# Patient Record
Sex: Female | Born: 1975 | Race: White | Hispanic: No | Marital: Married | Smoking: Former smoker
Health system: Southern US, Community
[De-identification: ages and names within clinical notes are randomized; demographics above are authoritative.]

## PROBLEM LIST (undated history)

## (undated) DIAGNOSIS — E041 Nontoxic single thyroid nodule: Secondary | ICD-10-CM

## (undated) DIAGNOSIS — I10 Essential (primary) hypertension: Secondary | ICD-10-CM

## (undated) DIAGNOSIS — E119 Type 2 diabetes mellitus without complications: Secondary | ICD-10-CM

## (undated) DIAGNOSIS — F329 Major depressive disorder, single episode, unspecified: Secondary | ICD-10-CM

## (undated) DIAGNOSIS — F32A Depression, unspecified: Secondary | ICD-10-CM

## (undated) DIAGNOSIS — F902 Attention-deficit hyperactivity disorder, combined type: Secondary | ICD-10-CM

## (undated) DIAGNOSIS — F419 Anxiety disorder, unspecified: Secondary | ICD-10-CM

## (undated) DIAGNOSIS — F411 Generalized anxiety disorder: Secondary | ICD-10-CM

## (undated) DIAGNOSIS — T7840XA Allergy, unspecified, initial encounter: Secondary | ICD-10-CM

## (undated) DIAGNOSIS — I517 Cardiomegaly: Secondary | ICD-10-CM

## (undated) DIAGNOSIS — F4001 Agoraphobia with panic disorder: Secondary | ICD-10-CM

## (undated) DIAGNOSIS — E785 Hyperlipidemia, unspecified: Secondary | ICD-10-CM

## (undated) HISTORY — DX: Attention-deficit hyperactivity disorder, combined type: F90.2

## (undated) HISTORY — DX: Major depressive disorder, single episode, unspecified: F32.9

## (undated) HISTORY — DX: Generalized anxiety disorder: F41.1

## (undated) HISTORY — DX: Anxiety disorder, unspecified: F41.9

## (undated) HISTORY — DX: Type 2 diabetes mellitus without complications: E11.9

## (undated) HISTORY — PX: OTHER SURGICAL HISTORY: SHX169

## (undated) HISTORY — DX: Depression, unspecified: F32.A

## (undated) HISTORY — DX: Nontoxic single thyroid nodule: E04.1

## (undated) HISTORY — DX: Agoraphobia with panic disorder: F40.01

## (undated) HISTORY — DX: Essential (primary) hypertension: I10

## (undated) HISTORY — DX: Cardiomegaly: I51.7

## (undated) HISTORY — DX: Morbid (severe) obesity due to excess calories: E66.01

## (undated) HISTORY — DX: Hyperlipidemia, unspecified: E78.5

## (undated) HISTORY — PX: TUBAL LIGATION: SHX77

## (undated) HISTORY — DX: Allergy, unspecified, initial encounter: T78.40XA

---

## 2008-06-15 HISTORY — PX: ENDOMETRIAL ABLATION W/ NOVASURE: SUR434

## 2008-06-18 ENCOUNTER — Ambulatory Visit: Payer: Self-pay | Admitting: Obstetrics and Gynecology

## 2008-06-25 ENCOUNTER — Ambulatory Visit: Payer: Self-pay | Admitting: Obstetrics and Gynecology

## 2014-06-29 LAB — HM PAP SMEAR

## 2014-08-10 LAB — HM DIABETES EYE EXAM

## 2014-08-18 ENCOUNTER — Emergency Department: Payer: Self-pay | Admitting: Emergency Medicine

## 2014-08-18 LAB — URINALYSIS, COMPLETE
BILIRUBIN, UR: NEGATIVE
GLUCOSE, UR: NEGATIVE mg/dL (ref 0–75)
Ketone: NEGATIVE
LEUKOCYTE ESTERASE: NEGATIVE
Nitrite: NEGATIVE
Ph: 5 (ref 4.5–8.0)
Protein: 100
RBC,UR: 2275 /HPF (ref 0–5)
Specific Gravity: 1.021 (ref 1.003–1.030)
Squamous Epithelial: 4
WBC UR: 4 /HPF (ref 0–5)

## 2014-08-18 LAB — CBC WITH DIFFERENTIAL/PLATELET
BASOS ABS: 0 10*3/uL (ref 0.0–0.1)
Basophil %: 0.5 %
EOS ABS: 0.1 10*3/uL (ref 0.0–0.7)
Eosinophil %: 1.1 %
HCT: 41.5 % (ref 35.0–47.0)
HGB: 13.9 g/dL (ref 12.0–16.0)
LYMPHS PCT: 30.9 %
Lymphocyte #: 2.8 10*3/uL (ref 1.0–3.6)
MCH: 28.5 pg (ref 26.0–34.0)
MCHC: 33.6 g/dL (ref 32.0–36.0)
MCV: 85 fL (ref 80–100)
MONOS PCT: 5.9 %
Monocyte #: 0.5 x10 3/mm (ref 0.2–0.9)
NEUTROS PCT: 61.6 %
Neutrophil #: 5.6 10*3/uL (ref 1.4–6.5)
Platelet: 304 10*3/uL (ref 150–440)
RBC: 4.9 10*6/uL (ref 3.80–5.20)
RDW: 13.4 % (ref 11.5–14.5)
WBC: 9 10*3/uL (ref 3.6–11.0)

## 2014-08-18 LAB — COMPREHENSIVE METABOLIC PANEL
Albumin: 3.8 g/dL (ref 3.4–5.0)
Alkaline Phosphatase: 72 U/L (ref 46–116)
Anion Gap: 7 (ref 7–16)
BUN: 11 mg/dL (ref 7–18)
Bilirubin,Total: 0.7 mg/dL (ref 0.2–1.0)
CO2: 28 mmol/L (ref 21–32)
CREATININE: 0.77 mg/dL (ref 0.60–1.30)
Calcium, Total: 8.9 mg/dL (ref 8.5–10.1)
Chloride: 104 mmol/L (ref 98–107)
Glucose: 135 mg/dL — ABNORMAL HIGH (ref 65–99)
OSMOLALITY: 279 (ref 275–301)
Potassium: 3.7 mmol/L (ref 3.5–5.1)
SGOT(AST): 40 U/L — ABNORMAL HIGH (ref 15–37)
SGPT (ALT): 70 U/L — ABNORMAL HIGH (ref 14–63)
SODIUM: 139 mmol/L (ref 136–145)
TOTAL PROTEIN: 7.5 g/dL (ref 6.4–8.2)

## 2014-08-18 LAB — LIPASE, BLOOD: LIPASE: 69 U/L — AB (ref 73–393)

## 2014-08-18 LAB — TROPONIN I: Troponin-I: <0.02 ng/mL

## 2014-10-16 DIAGNOSIS — M545 Low back pain, unspecified: Secondary | ICD-10-CM | POA: Insufficient documentation

## 2014-10-18 ENCOUNTER — Encounter: Payer: Self-pay | Admitting: Emergency Medicine

## 2014-10-25 DIAGNOSIS — I10 Essential (primary) hypertension: Secondary | ICD-10-CM

## 2014-10-25 DIAGNOSIS — E119 Type 2 diabetes mellitus without complications: Secondary | ICD-10-CM | POA: Insufficient documentation

## 2014-10-25 DIAGNOSIS — E668 Other obesity: Secondary | ICD-10-CM | POA: Insufficient documentation

## 2014-10-25 DIAGNOSIS — F339 Major depressive disorder, recurrent, unspecified: Secondary | ICD-10-CM | POA: Insufficient documentation

## 2014-10-25 DIAGNOSIS — Z09 Encounter for follow-up examination after completed treatment for conditions other than malignant neoplasm: Secondary | ICD-10-CM | POA: Insufficient documentation

## 2014-10-25 DIAGNOSIS — Z131 Encounter for screening for diabetes mellitus: Secondary | ICD-10-CM | POA: Insufficient documentation

## 2014-10-25 DIAGNOSIS — E785 Hyperlipidemia, unspecified: Secondary | ICD-10-CM | POA: Insufficient documentation

## 2014-10-25 DIAGNOSIS — M549 Dorsalgia, unspecified: Secondary | ICD-10-CM | POA: Insufficient documentation

## 2014-10-25 HISTORY — DX: Essential (primary) hypertension: I10

## 2014-12-16 ENCOUNTER — Ambulatory Visit: Payer: BLUE CROSS/BLUE SHIELD | Admitting: Family Medicine

## 2014-12-31 ENCOUNTER — Encounter: Payer: Self-pay | Admitting: Family Medicine

## 2014-12-31 ENCOUNTER — Other Ambulatory Visit: Payer: Self-pay | Admitting: Family Medicine

## 2014-12-31 ENCOUNTER — Ambulatory Visit (INDEPENDENT_AMBULATORY_CARE_PROVIDER_SITE_OTHER): Payer: BLUE CROSS/BLUE SHIELD | Admitting: Family Medicine

## 2014-12-31 VITALS — BP 132/88 | HR 116 | Temp 98.6°F | Resp 20 | Ht 65.0 in | Wt 247.4 lb

## 2014-12-31 DIAGNOSIS — E785 Hyperlipidemia, unspecified: Secondary | ICD-10-CM

## 2014-12-31 DIAGNOSIS — F329 Major depressive disorder, single episode, unspecified: Secondary | ICD-10-CM | POA: Diagnosis not present

## 2014-12-31 DIAGNOSIS — F411 Generalized anxiety disorder: Secondary | ICD-10-CM | POA: Insufficient documentation

## 2014-12-31 DIAGNOSIS — I10 Essential (primary) hypertension: Secondary | ICD-10-CM

## 2014-12-31 DIAGNOSIS — F32A Depression, unspecified: Secondary | ICD-10-CM

## 2014-12-31 DIAGNOSIS — E119 Type 2 diabetes mellitus without complications: Secondary | ICD-10-CM

## 2014-12-31 DIAGNOSIS — F320 Major depressive disorder, single episode, mild: Secondary | ICD-10-CM

## 2014-12-31 HISTORY — DX: Generalized anxiety disorder: F41.1

## 2014-12-31 LAB — POCT GLYCOSYLATED HEMOGLOBIN (HGB A1C): Hemoglobin A1C: 5.9

## 2014-12-31 MED ORDER — LISINOPRIL 10 MG PO TABS: 10.0000 mg | ORAL_TABLET | Freq: Every day | ORAL | Status: DC

## 2014-12-31 MED ORDER — METFORMIN HCL 1000 MG PO TABS: 1000.0000 mg | ORAL_TABLET | Freq: Two times a day (BID) | ORAL | Status: DC

## 2014-12-31 MED ORDER — SERTRALINE HCL 100 MG PO TABS: 100.0000 mg | ORAL_TABLET | Freq: Every day | ORAL | Status: DC

## 2014-12-31 NOTE — Progress Notes (Signed)
Name: Melanie Villarreal   MRN: [id number]    DOB: [birth year]   Date:12/31/2014       Progress Note  Subjective  Chief Complaint  Chief Complaint  Patient presents with  . Diabetes    6 month follow-up     HPI  Patient is here for routine follow up of Hypertension. First diagnosed with hypertension several years ago. Current anti-hypertension medication regimen includes dietary modification, weight management and Lisinopril 10mg  one a day. Patient is following physician recommended management. Is checking blood pressure outside of physician office. Associated symptoms do not include headache, dizziness, nausea, lower extremity swelling, shortness of breath, chest pain, numbness.  Patient is here for routine follow up of Diabetes Type II.  Current diabetes medication regimen includes Metformin 1000mg  twice a day. Patient is taking medications as instructed. Overall the patient feels that their blood glucose is well controlled. Checking blood glucose 0 times a day/week consistently/inconsistently. Fasting glucose range unknown. Meal time glucose range unknown. Lowest glucose result unknown.   Related symptoms? increased fatigue Any hypoglycemic symptoms?  No   Started smoking 2-3 cigarettes again due to stress but has not bought her own pack.      Past Medical History  Diagnosis Date  . Diabetes mellitus without complication   . Hyperlipidemia   . HTN, goal below 140/90   . Depression, controlled   . Morbid obesity     History reviewed. No pertinent past surgical history.  No family history on file.  History   Social History  . Marital Status: Married    Spouse Name: N/A  . Number of Children: N/A  . Years of Education: N/A   Occupational History  . Not on file.   Social History Main Topics  . Smoking status: Never Smoker   . Smokeless tobacco: Never Used  . Alcohol Use: 0.0 oz/week    0 Standard drinks or equivalent per week  . Drug Use: No  . Sexual Activity:    Partners: Male   Other Topics Concern  . Not on file   Social History Narrative     Current outpatient prescriptions:  .  lisinopril (PRINIVIL,ZESTRIL) 10 MG tablet, Take by mouth., Disp: , Rfl:  .  metFORMIN (GLUCOPHAGE) 1000 MG tablet, Take 1,000 mg by mouth 2 (two) times daily with a meal. , Disp: , Rfl:  .  sertraline (ZOLOFT) 100 MG tablet, Take by mouth., Disp: , Rfl:   Allergies  Allergen Reactions  . Penicillins Anaphylaxis     ROS  CONSTITUTIONAL: No significant weight changes, fever, chills, weakness or fatigue.  HEENT:  - Eyes: No visual changes.  - Ears: No auditory changes. No pain.  - Nose: No sneezing, congestion, runny nose. - Throat: No sore throat. No changes in swallowing. SKIN: No rash or itching.  CARDIOVASCULAR: No chest pain, chest pressure or chest discomfort. No palpitations or edema.  RESPIRATORY: No shortness of breath, cough or sputum.  GASTROINTESTINAL: No anorexia, nausea, vomiting. No changes in bowel habits. No abdominal pain or blood.  GENITOURINARY: No dysuria. No frequency. No discharge.  NEUROLOGICAL: No headache, dizziness, syncope, paralysis, ataxia, numbness or tingling in the extremities. No memory changes. No change in bowel or bladder control.  MUSCULOSKELETAL: No joint pain. No muscle pain. HEMATOLOGIC: No anemia, bleeding or bruising.  LYMPHATICS: No enlarged lymph nodes.  PSYCHIATRIC: No change in mood. No change in sleep pattern.  ENDOCRINOLOGIC: No reports of sweating, cold or heat intolerance. No polyuria or polydipsia.  Objective  Filed Vitals:   12/31/14 1605  BP: 132/88  Pulse: 116  Temp: 98.6 F (37 C)  TempSrc: Oral  Resp: 20  Height: 5\' 5"  (1.651 m)  Weight: 247 lb 6.4 oz (112.22 kg)  SpO2: 98%    Physical Exam  Constitutional: Patient is obese and well-nourished. In no distress.  HEENT:  - Head: Normocephalic and atraumatic.  - Ears: Bilateral TMs gray, no erythema or effusion - Nose: Nasal mucosa  moist - Mouth/Throat: Oropharynx is clear and moist. No tonsillar hypertrophy or erythema. No post nasal drainage.  - Eyes: Conjunctivae clear, EOM movements normal. PERRLA. No scleral icterus.  Neck: Normal range of motion. Neck supple. No JVD present. No thyromegaly present.  Cardiovascular: Normal rate, regular rhythm and normal heart sounds.  No murmur heard.  Pulmonary/Chest: Effort normal and breath sounds normal. No respiratory distress. Musculoskeletal: Normal range of motion bilateral UE and LE, no joint effusions. Peripheral vascular: Bilateral LE no edema. Neurological: CN II-XII grossly intact with no focal deficits. Alert and oriented to person, place, and time. Coordination, balance, strength, speech and gait are normal.  Skin: Skin is warm and dry. No rash noted. No erythema.  Psychiatric: Patient has a normal mood and affect. Behavior is normal in office today. Judgment and thought content normal in office today.   Assessment & Plan 1. Diabetes mellitus type 2, controlled, without complications [id number] at goal <6% continue current regimen.  - metFORMIN (GLUCOPHAGE) 1000 MG tablet; Take 1 tablet (1,000 mg total) by mouth 2 (two) times daily with a meal.  Dispense: 180 tablet; Refill: 1  2. Hyperlipidemia LDL goal <100 Due for blood work, patient wants to do it August-September.  3. Mild depression Refilled  - sertraline (ZOLOFT) 100 MG tablet; Take 1 tablet (100 mg total) by mouth daily.  Dispense: 90 tablet; Refill: 1  4. Hypertension goal BP (blood pressure) < 140/90 Improved since last visit. Continue lifestyle changes and Lisinopril 10mg  one a day.  - lisinopril (PRINIVIL,ZESTRIL) 10 MG tablet; Take 1 tablet (10 mg total) by mouth daily.  Dispense: 90 tablet; Refill: 1

## 2015-02-07 ENCOUNTER — Other Ambulatory Visit: Payer: Self-pay | Admitting: Family Medicine

## 2015-02-07 ENCOUNTER — Telehealth: Payer: Self-pay

## 2015-02-07 DIAGNOSIS — F419 Anxiety disorder, unspecified: Secondary | ICD-10-CM

## 2015-02-07 MED ORDER — LORAZEPAM 0.5 MG PO TABS: ORAL_TABLET | ORAL | Status: DC

## 2015-02-07 NOTE — Telephone Encounter (Signed)
Patient is requesting a low dose nonaddictive script, such as Ativan, for her anxiety. Patient uses [facility] on [address] [place].

## 2015-02-07 NOTE — Telephone Encounter (Signed)
Please let Melanie Villarreal know that I usually don't start a new medication without an office visit but since she just had a recent visit and I am aware of her mood symptoms I have printed out Ativan prescription for her to pick up. If the symptoms are ongoing, not well controled with Ativan, needs dose or quantity change she must follow up in clinic with me to document need. Thank you.

## 2015-02-07 NOTE — Telephone Encounter (Signed)
Patient informed. 

## 2015-03-14 ENCOUNTER — Other Ambulatory Visit: Payer: Self-pay | Admitting: Family Medicine

## 2015-03-14 ENCOUNTER — Telehealth: Payer: Self-pay

## 2015-03-14 DIAGNOSIS — R05 Cough: Secondary | ICD-10-CM

## 2015-03-14 DIAGNOSIS — T464X5A Adverse effect of angiotensin-converting-enzyme inhibitors, initial encounter: Principal | ICD-10-CM

## 2015-03-14 DIAGNOSIS — R058 Other specified cough: Secondary | ICD-10-CM

## 2015-03-14 DIAGNOSIS — I10 Essential (primary) hypertension: Secondary | ICD-10-CM

## 2015-03-14 MED ORDER — LOSARTAN POTASSIUM 50 MG PO TABS: 50.0000 mg | ORAL_TABLET | Freq: Every day | ORAL | Status: DC

## 2015-03-14 NOTE — Telephone Encounter (Signed)
Stopped Lisinopril 10 mg and sent in Rx for Losartan 50 mg one a day.

## 2015-03-14 NOTE — Telephone Encounter (Signed)
Patient called stating that she has been doing a lot of coughing since starting the Lisinopril and after talking with her pharmacist, she was told that she should stay away from meds that end with "opril". She has stopped taking it but wanted to know if she could get something else called in. Also she said to tell Dr. Nadine Counts, that the Ativan is "the best thing since sliced bread" and that she would like to continue taking it as needed. I told her that i would relay this information to Dr. Nadine Counts for her to decided and then we will go from there. She said ok and thanks.

## 2015-03-15 NOTE — Telephone Encounter (Signed)
Patient was informed via voice mail.

## 2015-04-20 ENCOUNTER — Other Ambulatory Visit: Payer: Self-pay | Admitting: Family Medicine

## 2015-06-22 ENCOUNTER — Other Ambulatory Visit: Payer: Self-pay | Admitting: Family Medicine

## 2015-06-23 ENCOUNTER — Other Ambulatory Visit: Payer: Self-pay

## 2015-06-23 NOTE — Telephone Encounter (Signed)
Patient wants to increase her dosage of Ativan from .5mg  to 1mg  for 30 days due to increase panic attacks. Patient stated if she has to make an appt she will.  Refill request was sent to Dr. Bobetta Lime for approval and submission.

## 2015-06-27 MED ORDER — LORAZEPAM 1 MG PO TABS: 1.0000 mg | ORAL_TABLET | Freq: Two times a day (BID) | ORAL | Status: DC | PRN

## 2015-07-13 ENCOUNTER — Other Ambulatory Visit: Payer: Self-pay | Admitting: Family Medicine

## 2015-08-08 ENCOUNTER — Other Ambulatory Visit: Payer: Self-pay | Admitting: Family Medicine

## 2015-08-30 ENCOUNTER — Ambulatory Visit (INDEPENDENT_AMBULATORY_CARE_PROVIDER_SITE_OTHER): Payer: BLUE CROSS/BLUE SHIELD | Admitting: Family Medicine

## 2015-08-30 ENCOUNTER — Encounter: Payer: Self-pay | Admitting: Family Medicine

## 2015-08-30 VITALS — BP 132/82 | HR 128 | Temp 98.9°F | Resp 16 | Ht 65.0 in | Wt 253.0 lb

## 2015-08-30 DIAGNOSIS — F3342 Major depressive disorder, recurrent, in full remission: Secondary | ICD-10-CM

## 2015-08-30 DIAGNOSIS — R200 Anesthesia of skin: Secondary | ICD-10-CM | POA: Insufficient documentation

## 2015-08-30 DIAGNOSIS — I10 Essential (primary) hypertension: Secondary | ICD-10-CM

## 2015-08-30 DIAGNOSIS — E119 Type 2 diabetes mellitus without complications: Secondary | ICD-10-CM

## 2015-08-30 DIAGNOSIS — R208 Other disturbances of skin sensation: Secondary | ICD-10-CM

## 2015-08-30 DIAGNOSIS — F902 Attention-deficit hyperactivity disorder, combined type: Secondary | ICD-10-CM | POA: Diagnosis not present

## 2015-08-30 LAB — POCT UA - MICROALBUMIN: Microalbumin Ur, POC: NEGATIVE mg/L

## 2015-08-30 LAB — POCT GLYCOSYLATED HEMOGLOBIN (HGB A1C): Hemoglobin A1C: 6.6

## 2015-08-30 MED ORDER — LOSARTAN POTASSIUM 100 MG PO TABS: 100.0000 mg | ORAL_TABLET | Freq: Every day | ORAL | Status: DC

## 2015-08-30 MED ORDER — LISDEXAMFETAMINE DIMESYLATE 20 MG PO CAPS: 20.0000 mg | ORAL_CAPSULE | Freq: Every day | ORAL | Status: DC

## 2015-08-30 NOTE — Progress Notes (Addendum)
Name: Melanie Villarreal   MRN: [id number]    DOB: 04-09-1976   Date:08/30/2015       Progress Note  Subjective  Chief Complaint  Chief Complaint  Patient presents with  . Medication Refill  . Diabetes    checks 3x per week low-70, high-180  . Hypertension    wants to increase  . Depression    working well  . ADHD    HPI  Melanie Villarreal is a 40 year old female here for follow up of chronic medical conditions. She has not followed up in clinic since 12/31/14.   Patient is here for routine follow up of Hypertension. First diagnosed with hypertension several years ago. Current anti-hypertension medication regimen includes dietary modification, weight management and Losartan 50mg  one a day. Patient is following physician recommended management. Is checking blood pressure outside of physician office with results as high as 156/90. Associated symptoms do not include headache, dizziness, nausea, lower extremity swelling, shortness of breath, chest pain, numbness.  Patient is here for routine follow up of Diabetes Type II.  Current diabetes medication regimen includes Metformin 1000 mg twice a day. Patient is taking medications as instructed, when she remembers. Overall the patient feels that their blood glucose is well controlled. Checking blood glucose 2-3 times a week inconsistently. Fasting glucose range 70-110. Meal time glucose range 140-180.   Depression: Patient complains of depression. She complains of depressed mood, difficulty concentrating, fatigue, impaired memory and psychomotor agitation. Onset was approximately several years ago, stable since that time.  She denies current suicidal and homicidal plan or intent.   Family history significant for anxiety and depression.Possible organic causes contributing are: endocrine/metabolic.  Risk factors: previous episode of depression and ADHD diagnosed and treated in the past. Previous treatment includes Ativan and Zoloft (Wellbutrin, Lexapro in  the past) and individual therapy. She complains of the following side effects from the treatment: none.  ADHD: She has graduated from her higher learning program Summer 2016 and started work shortly thereafter which has highlighted her ADHD symptoms. She has a break down recently and has come to terms with knowing that she may need to start back on medication. The condition is characterized as fidgeting, loses items necessary for activity, interrupting others, poor attention span, shifting from one uncompleted task to another and easily distracted but not excessive talking, difficulty remaining seated, difficulty waiting for his turn, engages in physically dangerous activities, difficulty waiting for his turn, blurting out answers before question is complete, difficulty following instructions or antisocial behavior. There has been no associated hearing difficulties, vision disturbances. In the past has used Adderall and she did not like how it made her feel unlike herself.    Past Medical History  Diagnosis Date  . Diabetes mellitus without complication ([place])   . Hyperlipidemia   . HTN, goal below 140/90   . Depression, controlled   . Morbid obesity ([place])   . Anxiety   . Allergy     Patient Active Problem List   Diagnosis Date Noted  . Cough due to ACE inhibitor 03/14/2015  . Anxiety 12/31/2014  . Body mass index of 60 or higher ([place]) 10/25/2014  . Back ache 10/25/2014  . Mild depression 10/25/2014  . Diabetes mellitus type 2, controlled, without complications ([place]) [id number]  . Screening for diabetes mellitus 10/25/2014  . HLD (hyperlipidemia) 10/25/2014  . Hypertension goal BP (blood pressure) < 140/90 10/25/2014  . Extreme obesity ([place]) 10/25/2014  . Follow-up examination 10/25/2014  . Term  birth of infant 10/25/2014  . Low back pain 10/16/2014    Social History  Substance Use Topics  . Smoking status: Current Some Day Smoker    Types: Cigarettes  . Smokeless tobacco: Never  Used  . Alcohol Use: 0.0 oz/week    0 Standard drinks or equivalent per week     Current outpatient prescriptions:  .  LORazepam (ATIVAN) 1 MG tablet, take 1 tablet by mouth twice a day if needed for anxiety, Disp: 30 tablet, Rfl: 2 .  losartan (COZAAR) 50 MG tablet, take 1 tablet by mouth once daily, Disp: 90 tablet, Rfl: 2 .  metFORMIN (GLUCOPHAGE) 1000 MG tablet, Take 1 tablet (1,000 mg total) by mouth 2 (two) times daily with a meal., Disp: 180 tablet, Rfl: 1 .  sertraline (ZOLOFT) 100 MG tablet, take 1 tablet by mouth once daily, Disp: 90 tablet, Rfl: 1  Past Surgical History  Procedure Laterality Date  . Cesarean section    . Tubal ligation      Family History  Problem Relation Age of Onset  . Heart disease Mother   . Hyperlipidemia Mother   . Hypertension Mother   . Miscarriages / Stillbirths Mother     2  . Anxiety disorder Mother   . Alcohol abuse Father   . Cancer Father     testicular  . Depression Father   . Heart disease Sister   . Hyperlipidemia Sister   . Hypertension Sister   . Drug abuse Brother   . Alcohol abuse Brother   . Depression Brother   . ADD / ADHD Son   . Mental illness Maternal Aunt   . Alcohol abuse Maternal Grandfather   . Depression Maternal Grandfather   . Anxiety disorder Maternal Grandfather   . Cancer Paternal Grandmother   . Alcohol abuse Paternal Grandfather     Allergies  Allergen Reactions  . Penicillins Anaphylaxis     Review of Systems  CONSTITUTIONAL: Yes significant weight changes. No fever, chills, weakness or fatigue.  CARDIOVASCULAR: No chest pain, chest pressure or chest discomfort. No palpitations or edema.  RESPIRATORY: No shortness of breath, cough or sputum.  GASTROINTESTINAL: No anorexia, nausea, vomiting. No changes in bowel habits. No abdominal pain or blood.  GENITOURINARY: No dysuria. No frequency. No discharge.  NEUROLOGICAL: No headache, dizziness, syncope, paralysis, ataxia, numbness or tingling in  the extremities. No memory changes. No change in bowel or bladder control.  MUSCULOSKELETAL: No joint pain. No muscle pain. HEMATOLOGIC: No anemia, bleeding or bruising.  LYMPHATICS: No enlarged lymph nodes.  PSYCHIATRIC: No change in mood. No change in sleep pattern.  ENDOCRINOLOGIC: No reports of sweating, cold or heat intolerance. No polyuria or polydipsia.    Objective  BP 132/82 mmHg  Pulse 128  Temp(Src) 98.9 F (37.2 C) (Oral)  Resp 16  Ht 5\' 5"  (1.651 m)  Wt 253 lb (114.76 kg)  BMI 42.10 kg/m2  SpO2 97%  LMP 08/29/2015 (Approximate) Body mass index is 42.1 kg/(m^2).  Physical Exam  Constitutional: Patient is obese and well-nourished. In no distress.  Cardiovascular: Normal rate, regular rhythm and normal heart sounds.  No murmur heard.  Pulmonary/Chest: Effort normal and breath sounds normal. No respiratory distress. Musculoskeletal: Normal range of motion bilateral UE and LE, no joint effusions. Peripheral vascular: Bilateral LE no edema. Neurological: CN II-XII grossly intact with no focal deficits. Alert and oriented to person, place, and time. Coordination, balance, strength, speech and gait are normal.  Skin: Skin is warm and  dry. No rash noted. No erythema.  Psychiatric: Patient has her usual talkative happy (ocassionally emotional) mood and affect. Behavior is normal in office today. Judgment and thought content normal in office today.  Diabetic Foot Exam - Simple   Simple Foot Form  Diabetic Foot exam was performed with the following findings:  Yes 08/30/2015  2:16 PM  Visual Inspection  No deformities, no ulcerations, no other skin breakdown bilaterally:  Yes  Sensation Testing  Intact to touch and monofilament testing bilaterally:  Yes  Pulse Check  Posterior Tibialis and Dorsalis pulse intact bilaterally:  Yes  Comments    Does have numbness over the dorsal surface of right foot 1st digit from previous cut/injury likely causing a nerve  injury.   Results for orders placed or performed in visit on 08/30/15 (from the past 24 hour(s))  POCT HgB A1C     Status: Normal   Collection Time: 08/30/15  1:57 PM  Result Value Ref Range   Hemoglobin A1C 6.6     Assessment & Plan  1. Diabetes mellitus without complication ([place]) Well controled despite poor diet (from stress after starting to work), skipping her medications, back to drinking sodas.   Patient's Hba1c goal is <6.5% for stringent control.  Patient's Hba1c goal is <7% is acceptable  Encouraged patient to continue efforts on checking blood glucose on a daily basis. Fasting blood glucose control goal is 80-130mg /dL and post prandial blood glucose control is 180mg /dL.  Reviewed diet, exercise, lifestyle changes and current medication regimen pertaining to diabetes with the patient.   Reminded patient of the required annual dilated retinal exam.    - POCT HgB A1C - POCT UA - Microalbumin  2. Major depressive disorder, recurrent, in full remission with anxious distress ([place]) Fairly stable on current regimen.  3. Hypertension goal BP (blood pressure) < 140/90 Increase Losartan from 50mg  to 100 mg due to BPs being high outside of office and her starting Vyvanse today.  - losartan (COZAAR) 100 MG tablet; Take 1 tablet (100 mg total) by mouth daily.  Dispense: 90 tablet; Refill: 2  4. ADHD (attention deficit hyperactivity disorder), combined type We discussed treatment options and decided on Vyvanse as a good choice for Melanie Villarreal. The patient has been counseled on the proper use, side effects and potential interactions of the new medication. Patient encouraged to review the side effects and safety profile pamphlet provided with the prescription from the pharmacy as well as request counseling from the pharmacy team as needed.   - lisdexamfetamine (VYVANSE) 20 MG capsule; Take 1 capsule (20 mg total) by mouth daily.  Dispense: 30 capsule; Refill: 0  5. Numbness of  toes From injury, not peripheral neuropathy.

## 2015-09-28 ENCOUNTER — Ambulatory Visit (INDEPENDENT_AMBULATORY_CARE_PROVIDER_SITE_OTHER): Payer: BLUE CROSS/BLUE SHIELD | Admitting: Family Medicine

## 2015-09-28 ENCOUNTER — Encounter: Payer: Self-pay | Admitting: Family Medicine

## 2015-09-28 VITALS — BP 144/86 | HR 114 | Temp 98.3°F | Resp 16 | Ht 65.0 in | Wt 250.0 lb

## 2015-09-28 DIAGNOSIS — F902 Attention-deficit hyperactivity disorder, combined type: Secondary | ICD-10-CM | POA: Diagnosis not present

## 2015-09-28 DIAGNOSIS — R21 Rash and other nonspecific skin eruption: Secondary | ICD-10-CM

## 2015-09-28 DIAGNOSIS — F3342 Major depressive disorder, recurrent, in full remission: Secondary | ICD-10-CM | POA: Diagnosis not present

## 2015-09-28 DIAGNOSIS — J9801 Acute bronchospasm: Secondary | ICD-10-CM

## 2015-09-28 DIAGNOSIS — B349 Viral infection, unspecified: Secondary | ICD-10-CM

## 2015-09-28 DIAGNOSIS — I10 Essential (primary) hypertension: Secondary | ICD-10-CM | POA: Diagnosis not present

## 2015-09-28 MED ORDER — AZITHROMYCIN 250 MG PO TABS: ORAL_TABLET | ORAL | Status: DC

## 2015-09-28 MED ORDER — LISDEXAMFETAMINE DIMESYLATE 40 MG PO CAPS: 40.0000 mg | ORAL_CAPSULE | Freq: Every day | ORAL | Status: DC

## 2015-09-28 MED ORDER — LISDEXAMFETAMINE DIMESYLATE 40 MG PO CAPS: 40.0000 mg | ORAL_CAPSULE | ORAL | Status: DC

## 2015-09-28 MED ORDER — SERTRALINE HCL 100 MG PO TABS: 100.0000 mg | ORAL_TABLET | Freq: Every day | ORAL | Status: DC

## 2015-09-28 NOTE — Addendum Note (Signed)
Addended by: Bobetta Lime on: 09/28/2015 01:40 PM   Modules accepted: Miquel Dunn

## 2015-09-28 NOTE — Progress Notes (Signed)
Name: Melanie Villarreal   MRN: [id number]    DOB: 1976/07/07   Date:09/28/2015       Progress Note  Subjective  Chief Complaint  Chief Complaint  Patient presents with  . Medication Refill  . ADHD    wants to increase vyvanse to 40mg   . Rash    1 week.  reddness, burns  . URI    HPI   Melanie Villarreal is a 40 year old female here for follow up of chronic medical conditions. She also complains of a red bumpy rash and URI symptoms. Rash only on face, started after using husband's face wash.   Patient is here for routine follow up of Hypertension. First diagnosed with hypertension several years ago. Current anti-hypertension medication regimen includes dietary modification, weight management and Losartan 100mg  one a day (increased at last visit). Patient is following physician recommended management. Is checking blood pressure outside of physician office. Associated symptoms do not include headache, dizziness, nausea, lower extremity swelling, shortness of breath, chest pain, numbness.  Depression: Patient complains of depression. She complains of depressed mood, difficulty concentrating, fatigue, impaired memory and psychomotor agitation. Onset was approximately several years ago, stable since that time. She denies current suicidal and homicidal plan or intent. Family history significant for anxiety and depression.Possible organic causes contributing are: endocrine/metabolic. Risk factors: previous episode of depression and ADHD diagnosed and treated in the past. Previous treatment includes Ativan and Zoloft (Wellbutrin, Lexapro in the past) and individual therapy. She complains of the following side effects from the treatment: none.  ADHD: She has graduated from her higher learning program Summer 2016 and started work shortly thereafter which has highlighted her ADHD symptoms. She has a break down recently and has come to terms with knowing that she may need to start back on medication. The  condition is characterized as fidgeting, loses items necessary for activity, interrupting others, poor attention span, shifting from one uncompleted task to another and easily distracted but not excessive talking, difficulty remaining seated, difficulty waiting for his turn, engages in physically dangerous activities, difficulty waiting for his turn, blurting out answers before question is complete, difficulty following instructions or antisocial behavior. There has been no associated hearing difficulties, vision disturbances. In the past has used Adderall and she did not like how it made her feel unlike herself. At our last visit 1 month ago I started her on Vyvanse 20mg  a day. She reported improved symptoms and would like to increase dose.   Patient is also here today with concerns regarding the following symptoms sinus pressure, productive cough, achiness and low grade fevers that started weeks ago.  Associated with fevers and malaise. Has tried the following home remedies: dayquil    Past Medical History  Diagnosis Date  . Diabetes mellitus without complication ([place])   . Hyperlipidemia   . HTN, goal below 140/90   . Depression, controlled   . Morbid obesity ([place])   . Anxiety   . Allergy   . ADHD (attention deficit hyperactivity disorder), combined type     Social History  Substance Use Topics  . Smoking status: Current Some Day Smoker    Types: Cigarettes  . Smokeless tobacco: Never Used  . Alcohol Use: 0.0 oz/week    0 Standard drinks or equivalent per week     Current outpatient prescriptions:  .  lisdexamfetamine (VYVANSE) 20 MG capsule, Take 1 capsule (20 mg total) by mouth daily., Disp: 30 capsule, Rfl: 0 .  LORazepam (ATIVAN) 1 MG  tablet, take 1 tablet by mouth twice a day if needed for anxiety, Disp: 30 tablet, Rfl: 2 .  losartan (COZAAR) 100 MG tablet, Take 1 tablet (100 mg total) by mouth daily., Disp: 90 tablet, Rfl: 2 .  metFORMIN (GLUCOPHAGE) 1000 MG tablet, Take 1  tablet (1,000 mg total) by mouth 2 (two) times daily with a meal., Disp: 180 tablet, Rfl: 1 .  sertraline (ZOLOFT) 100 MG tablet, take 1 tablet by mouth once daily, Disp: 90 tablet, Rfl: 1  Allergies  Allergen Reactions  . Penicillins Anaphylaxis    ROS  CONSTITUTIONAL: No significant weight changes, fever, chills, weakness or fatigue.  HEENT:  - Eyes: No visual changes.  - Ears: No auditory changes. No pain.  - Nose: Yes nasal congestion and runny nose. - Throat: No sore throat. No changes in swallowing. SKIN: Yes rash or itching.  CARDIOVASCULAR: No chest pain, chest pressure or chest discomfort. No palpitations or edema.  RESPIRATORY: No shortness of breath. Yes cough. GASTROINTESTINAL: No anorexia, nausea, vomiting. No changes in bowel habits. No abdominal pain or blood.  GENITOURINARY: No dysuria. No frequency. No discharge.  NEUROLOGICAL: No headache, dizziness, syncope, paralysis, ataxia, numbness or tingling in the extremities. No memory changes. No change in bowel or bladder control.  MUSCULOSKELETAL: No joint pain. No muscle pain. HEMATOLOGIC: No anemia, bleeding or bruising.  LYMPHATICS: No enlarged lymph nodes.  PSYCHIATRIC: Improved change in mood. No change in sleep pattern.  ENDOCRINOLOGIC: No reports of sweating, cold or heat intolerance. No polyuria or polydipsia.      Objective  Vitals Blood pressure 144/86, pulse 114, temperature 98.3 F (36.8 C), temperature source Oral, resp. rate 16, height 5\' 5"  (1.651 m), weight 250 lb (113.399 kg), last menstrual period 08/29/2015, SpO2 97 %.  Body mass index is 41.6 kg/(m^2).   Physical Exam  Constitutional: Patient remains obese and well-nourished. In no acute distress. HEENT:  - Head: Normocephalic and atraumatic. Face with red maculopapular rash, mostly on cheeks and chin. No open skin, scales, pustules. - Ears: RIGHT TM bulging with minimal clear exudate, LEFT TM bulging with minimal clear exudate.  - Nose:  Nasal mucosa boggy and congested.  - Mouth/Throat: Oropharynx is moist with slight erythema of bilateral tonsils without hypertrophy or exudates. Post nasal drainage present.  - Eyes: Conjunctivae clear, EOM movements normal. PERRLA. No scleral icterus.  Neck: Normal range of motion. Neck supple. No JVD present. No thyromegaly present. No local lymphadenopathy. Cardiovascular: Regular rate, regular rhythm with no murmurs heard.  Pulmonary/Chest: Effort normal and breath sounds clear in all lung fields. Some coarse breath sounds audible upper anterior fields. Musculoskeletal: Normal range of motion bilateral UE and LE, no joint effusions. Skin: Skin is warm and dry otherwise Psychiatric: Patient has a stable mood and affect. Behavior is normal in office today. Judgment and thought content normal in office today.   Assessment & Plan   1. Major depressive disorder, recurrent, in full remission with anxious distress ([place]) Well controled.  - sertraline (ZOLOFT) 100 MG tablet; Take 1 tablet (100 mg total) by mouth daily.  Dispense: 90 tablet; Refill: 1  2. Hypertension goal BP (blood pressure) < 140/90 Slightly elevated today. Says she ate salty food at lunch before coming here. If elevated at next visit I recommended that she start HCTZ as well.  3. ADHD (attention deficit hyperactivity disorder), combined type Improved symptoms on Vyvanse will increase dose to sustain effects.    - lisdexamfetamine (VYVANSE) 40 MG capsule; Take 1  capsule (40 mg total) by mouth daily.  Dispense: 30 capsule; Refill: 0 - lisdexamfetamine (VYVANSE) 40 MG capsule; Take 1 capsule (40 mg total) by mouth every morning.  Dispense: 30 capsule; Refill: 0 - lisdexamfetamine (VYVANSE) 40 MG capsule; Take 1 capsule (40 mg total) by mouth every morning.  Dispense: 30 capsule; Refill: 0  4. Acute bronchospasm due to viral infection Supportive therapy recommended, if symptoms worsen may pick up abx.  - azithromycin  (ZITHROMAX) 250 MG tablet; 2 tabs po day 1, then 1 tab po qday for 4 more days, Zpak  Dispense: 6 tablet; Refill: 0  5. Facial rash Allergic rash, recommended no make up, use sun block if not irritative otherwise keep well moisturized and may mix small amount of hydrocortisone with cream/lotion.

## 2015-10-28 ENCOUNTER — Other Ambulatory Visit: Payer: Self-pay | Admitting: Family Medicine

## 2015-10-28 DIAGNOSIS — E119 Type 2 diabetes mellitus without complications: Secondary | ICD-10-CM

## 2015-10-28 MED ORDER — METFORMIN HCL 1000 MG PO TABS: 1000.0000 mg | ORAL_TABLET | Freq: Two times a day (BID) | ORAL | Status: DC

## 2015-10-28 NOTE — Telephone Encounter (Signed)
I will need to see patient sooner than June; she can see me in June for ADHD, but she has not had liver or kidney function or lipids checked in over a year Please ask her to schedule fasting visit with me in next 2 weeks for just diabetes, cholesterol, and HTN We'll keep June appt for ADHD, anxiety Thanks

## 2015-10-28 NOTE — Telephone Encounter (Signed)
Patient was last seen on 09-28-15 by Dr Nadine Counts. The prescription for Metformin was not filled. Please send a refill to [facility] She does have another appointment scheduled in a couple of months

## 2015-10-31 NOTE — Telephone Encounter (Signed)
Left voice message to inform patient that prescription has been sent to pharmacy and that we are needing to schedule appointment

## 2015-11-14 ENCOUNTER — Other Ambulatory Visit: Payer: Self-pay

## 2015-11-14 NOTE — Telephone Encounter (Signed)
Patient needs an appt to discuss Xanax; this is a controlled substance; thank you

## 2015-11-14 NOTE — Telephone Encounter (Signed)
LVM for pt to call the office back.  °

## 2015-12-15 ENCOUNTER — Other Ambulatory Visit: Payer: Self-pay | Admitting: Family Medicine

## 2015-12-16 NOTE — Telephone Encounter (Signed)
See 10/28/15 note; needs labs and appt; no renal function in over a year Rx denied

## 2015-12-30 ENCOUNTER — Encounter: Payer: Self-pay | Admitting: Family Medicine

## 2015-12-30 ENCOUNTER — Ambulatory Visit (INDEPENDENT_AMBULATORY_CARE_PROVIDER_SITE_OTHER): Payer: BLUE CROSS/BLUE SHIELD | Admitting: Family Medicine

## 2015-12-30 VITALS — BP 140/82 | HR 113 | Temp 98.7°F | Resp 16 | Wt 243.0 lb

## 2015-12-30 DIAGNOSIS — E785 Hyperlipidemia, unspecified: Secondary | ICD-10-CM

## 2015-12-30 DIAGNOSIS — I10 Essential (primary) hypertension: Secondary | ICD-10-CM

## 2015-12-30 DIAGNOSIS — F4001 Agoraphobia with panic disorder: Secondary | ICD-10-CM

## 2015-12-30 DIAGNOSIS — E119 Type 2 diabetes mellitus without complications: Secondary | ICD-10-CM | POA: Diagnosis not present

## 2015-12-30 DIAGNOSIS — Z79899 Other long term (current) drug therapy: Secondary | ICD-10-CM | POA: Diagnosis not present

## 2015-12-30 DIAGNOSIS — F411 Generalized anxiety disorder: Secondary | ICD-10-CM | POA: Diagnosis not present

## 2015-12-30 DIAGNOSIS — F3342 Major depressive disorder, recurrent, in full remission: Secondary | ICD-10-CM

## 2015-12-30 DIAGNOSIS — Z5181 Encounter for therapeutic drug level monitoring: Secondary | ICD-10-CM

## 2015-12-30 DIAGNOSIS — F902 Attention-deficit hyperactivity disorder, combined type: Secondary | ICD-10-CM

## 2015-12-30 HISTORY — DX: Agoraphobia with panic disorder: F40.01

## 2015-12-30 MED ORDER — LISDEXAMFETAMINE DIMESYLATE 40 MG PO CAPS: 40.0000 mg | ORAL_CAPSULE | ORAL | Status: DC

## 2015-12-30 MED ORDER — LORAZEPAM 1 MG PO TABS: ORAL_TABLET | ORAL | Status: DC

## 2015-12-30 MED ORDER — SERTRALINE HCL 100 MG PO TABS: 150.0000 mg | ORAL_TABLET | Freq: Every day | ORAL | Status: DC

## 2015-12-30 NOTE — Patient Instructions (Addendum)
Check out Answers to Distraction by [place] and [place] Try [place] = Only Handle It Once  Increase sertraline to 150 mg daily  Return in 1-2 weeks for diabetes, lipids, etc and come fasting for labs the day or two BEFORE your appointment

## 2015-12-30 NOTE — Progress Notes (Signed)
BP 140/82 mmHg  Pulse 113  Temp(Src) 98.7 F (37.1 C) (Oral)  Resp 16  Wt 243 lb (110.224 kg)  SpO2 98%  LMP 12/26/2015   Subjective:    Patient ID: Melanie Villarreal, female    DOB: [birth year], 40 y.o.   MRN: [id number]  HPI: Melanie Villarreal is a 40 y.o. female  Chief Complaint  Patient presents with  . Medication Refill   Patient is new to me with multiple conditions to cover in a short visit; we decided mutually to cover her mental health issues today and have her return for cholesterol, diabetes, hypertension, etc. Hx depression Stopped taking anxiety medicine years ago Graduated with master's degree, started going back on medicine Does not want clonazepam; took it once and cold [place] quit it and almost had a seizure Does not like to medicine, only if needed Only does 0.5 mg total per day, lorazepam 0.5 mg total taken at night; she rarely doubles and takes another 0.5 mg She loves her antidepressant, sertraline works well; 100 mg wants to stay there; if she misses a few she can tell a difference  She has ADHD; Vyvanse is perfect for her ADHD; would like to take 50 mg but won't go up; notices drop in mood on the day she doesn't take it; she is very self-aware No headaches; does not eat as much as she used to; fewer sweets; no tremors; no palpitations Seeing a benefit on the medicine; she refused to take ADHD med for a long time; working with mental health clients; medicine  Benefit of staying of medicine: can focus on clients and focus on their well-being; staying on tasks; increased/better organizational skills; better listener Hated adderall years ago  Uses lorazepam for anxiety; generalized anxiety d/o; does get panic attacks; one last Tuesday  Patient has been to cardiologist, passed stress test, Dr. Clayborn Bigness  She'll return for fasting labs, lipids, DM, HTN next Stopped the losartan b/c it made her gain weight she says (still in med list)  Depression screen [facility] 2/9  12/30/2015 08/30/2015 12/31/2014  Decreased Interest 0 0 0  Down, Depressed, Hopeless 0 0 0  PHQ - 2 Score 0 0 0   Relevant past medical, surgical, family and social history reviewed Past Medical History  Diagnosis Date  . Diabetes mellitus without complication ([place])   . Hyperlipidemia   . HTN, goal below 140/90   . Depression, controlled   . Morbid obesity ([place])   . Anxiety   . Allergy   . ADHD (attention deficit hyperactivity disorder), combined type   . Panic disorder with agoraphobia and moderate panic attacks 12/30/2015  . Generalized anxiety disorder 12/31/2014   Past Surgical History  Procedure Laterality Date  . Cesarean section    . Tubal ligation     Family History  Problem Relation Age of Onset  . Heart disease Mother   . Hyperlipidemia Mother   . Hypertension Mother   . Miscarriages / Stillbirths Mother     2  . Anxiety disorder Mother   . Alcohol abuse Father   . Cancer Father     testicular  . Depression Father   . Heart disease Sister   . Hyperlipidemia Sister   . Hypertension Sister   . Drug abuse Brother   . Alcohol abuse Brother   . Depression Brother   . ADD / ADHD Son   . Mental illness Maternal Aunt   . Alcohol abuse Maternal Grandfather   . Depression  Maternal Grandfather   . Anxiety disorder Maternal Grandfather   . Cancer Paternal Grandmother   . Alcohol abuse Paternal Grandfather    Social History  Substance Use Topics  . Smoking status: Former Smoker    Types: Cigarettes    Quit date: 11/28/2015  . Smokeless tobacco: Never Used  . Alcohol Use: 0.0 oz/week    0 Standard drinks or equivalent per week   Interim medical history since last visit reviewed. Allergies and medications reviewed  Review of Systems Per HPI unless specifically indicated above     Objective:    BP 140/82 mmHg  Pulse 113  Temp(Src) 98.7 F (37.1 C) (Oral)  Resp 16  Wt 243 lb (110.224 kg)  SpO2 98%  LMP 12/26/2015  Wt Readings from Last 3 Encounters:    12/30/15 243 lb (110.224 kg)  09/28/15 250 lb (113.399 kg)  08/30/15 253 lb (114.76 kg)    Physical Exam  Constitutional: She appears well-developed and well-nourished. No distress.  HENT:  Head: Normocephalic and atraumatic.  Eyes: EOM are normal. No scleral icterus.  Neck: No thyromegaly present.  Cardiovascular: Regular rhythm and normal heart sounds.  Tachycardia present.   No murmur heard. Right above 100 bpm during auscultation  Pulmonary/Chest: Effort normal and breath sounds normal. No respiratory distress. She has no wheezes.  Abdominal: Soft. Bowel sounds are normal. She exhibits no distension.  Musculoskeletal: Normal range of motion. She exhibits no edema.  Neurological: She is alert. She exhibits normal muscle tone.  Skin: Skin is warm and dry. She is not diaphoretic. No pallor.  Psychiatric: She has a normal mood and affect. Her behavior is normal. Judgment and thought content normal. Her mood appears not anxious. She does not exhibit a depressed mood.  Good eye contact with examiner      Assessment & Plan:   Problem List Items Addressed This Visit      Cardiovascular and Mediastinum   Hypertension goal BP (blood pressure) < 140/90    Not ideal; will not increase med; try weight loss and DASH guidelines        Endocrine   Diabetes mellitus type 2, controlled, without complications (HCC) (Chronic)    Check A1c      Relevant Orders   Hemoglobin A1c (Completed)   Microalbumin / creatinine urine ratio     Other   ADHD (attention deficit hyperactivity disorder), combined type - Primary    Continue Vyvanse; will not go up on dose right now since working well; see AVS      Relevant Medications   lisdexamfetamine (VYVANSE) 40 MG capsule   Chronic prescription benzodiazepine use    Explained serious warning of combining opiates with benzos, in case pt ever has surgery or injury or is given Rx for anything containing a narcotic; could cause accidental fatal  overdose; discussed the Aug 2016 FDA press release regarding this; if taking daily, do not ever stop abruptly b/c of risk of seizures; talk to provider about weaning down; never ever mix with alcohol, and do not combine benzo with alcohol within six hours of each other      Controlled substance agreement signed    Discussed risk of controlled substances including possible unintentional overdose, especially if mixed with alcohol or other pills; typical speech given including illegal to share, even out of the goodness of patient's heart, always keep in the original bottle, safeguard medicine, do NOT mix with alcohol, other pain pills, "nerve" or anxiety pills, or sleeping pills; I  am not obligated to approve of early refill or give new prescription if medicine is lost, stolen, or destroyed even with a police report, etc.; patient agrees with plan; controlled substance contract signed; copy of contract given to patient       Generalized anxiety disorder    Increase sertraline to 150 mg daily; continue judicious use of benzo      HLD (hyperlipidemia)    Check lipids      Relevant Orders   Lipid panel (Completed)   Major depressive disorder, recurrent, in full remission with anxious distress (HCC)   Relevant Medications   sertraline (ZOLOFT) 100 MG tablet   LORazepam (ATIVAN) 1 MG tablet   Medication monitoring encounter   Relevant Orders   CBC with Differential/Platelet   COMPLETE METABOLIC PANEL WITH GFR   Panic disorder with agoraphobia and moderate panic attacks      Follow up plan: Return in about 1 week (around 01/06/2016) for diabetes, cholesterol, etc.; labs 1-2 days prior.  An after-visit summary was printed and given to the patient at [facility].  Please see the patient instructions which may contain other information and recommendations beyond what is mentioned above in the assessment and plan.  Meds ordered this encounter  Medications  . sertraline (ZOLOFT) 100 MG tablet     Sig: Take 1.5 tablets (150 mg total) by mouth daily.    Dispense:  135 tablet    Refill:  1  . LORazepam (ATIVAN) 1 MG tablet    Sig: One-half or one whole pill by mouth at bedtime if needed    Dispense:  20 tablet    Refill:  0  . lisdexamfetamine (VYVANSE) 40 MG capsule    Sig: Take 1 capsule (40 mg total) by mouth every morning.    Dispense:  30 capsule    Refill:  0    Refill 11/28/15    Orders Placed This Encounter  Procedures  . Hemoglobin A1c  . Lipid panel  . CBC with Differential/Platelet  . Microalbumin / creatinine urine ratio  . COMPLETE METABOLIC PANEL WITH GFR  . COMPLETE METABOLIC PANEL WITH GFR  . Microalbumin / creatinine urine ratio  . CBC with Differential/Platelet

## 2015-12-30 NOTE — Assessment & Plan Note (Signed)
Increase sertraline to 150 mg daily; continue judicious use of benzo

## 2015-12-30 NOTE — Assessment & Plan Note (Signed)
Continue Vyvanse; will not go up on dose right now since working well; see AVS

## 2015-12-30 NOTE — Assessment & Plan Note (Signed)

## 2016-01-03 DIAGNOSIS — Z5181 Encounter for therapeutic drug level monitoring: Secondary | ICD-10-CM | POA: Insufficient documentation

## 2016-01-03 LAB — COMPLETE METABOLIC PANEL WITH GFR
ALT: 43 U/L — AB (ref 6–29)
AST: 26 U/L (ref 10–30)
Albumin: 4.4 g/dL (ref 3.6–5.1)
Alkaline Phosphatase: 71 U/L (ref 33–115)
BILIRUBIN TOTAL: 0.9 mg/dL (ref 0.2–1.2)
BUN: 10 mg/dL (ref 7–25)
CALCIUM: 9.3 mg/dL (ref 8.6–10.2)
CHLORIDE: 102 mmol/L (ref 98–110)
CO2: 23 mmol/L (ref 20–31)
CREATININE: 0.7 mg/dL (ref 0.50–1.10)
Glucose, Bld: 127 mg/dL — ABNORMAL HIGH (ref 65–99)
Potassium: 4.3 mmol/L (ref 3.5–5.3)
Sodium: 138 mmol/L (ref 135–146)
TOTAL PROTEIN: 7.1 g/dL (ref 6.1–8.1)

## 2016-01-03 LAB — LIPID PANEL
CHOLESTEROL: 226 mg/dL — AB (ref 125–200)
HDL: 29 mg/dL — AB (ref >=46)
LDL CALC: 143 mg/dL — AB (ref <130)
TRIGLYCERIDES: 270 mg/dL — AB (ref <150)
Total CHOL/HDL Ratio: 7.8 Ratio — ABNORMAL HIGH (ref <=5.0)
VLDL: 54 mg/dL — AB (ref <30)

## 2016-01-03 LAB — CBC WITH DIFFERENTIAL/PLATELET
BASOS ABS: 0 {cells}/uL (ref 0–200)
Basophils Relative: 0 %
EOS ABS: 84 {cells}/uL (ref 15–500)
EOS PCT: 1 %
HCT: 41.9 % (ref 35.0–45.0)
Hemoglobin: 14.2 g/dL (ref 11.7–15.5)
LYMPHS PCT: 36 %
Lymphs Abs: 3024 cells/uL (ref 850–3900)
MCH: 28.7 pg (ref 27.0–33.0)
MCHC: 33.9 g/dL (ref 32.0–36.0)
MCV: 84.8 fL (ref 80.0–100.0)
MONOS PCT: 6 %
MPV: 10.8 fL (ref 7.5–12.5)
Monocytes Absolute: 504 cells/uL (ref 200–950)
NEUTROS PCT: 57 %
Neutro Abs: 4788 cells/uL (ref 1500–7800)
PLATELETS: 342 10*3/uL (ref 140–400)
RBC: 4.94 MIL/uL (ref 3.80–5.10)
RDW: 14.1 % (ref 11.0–15.0)
WBC: 8.4 10*3/uL (ref 3.8–10.8)

## 2016-01-03 LAB — HEMOGLOBIN A1C
Hgb A1c MFr Bld: 6.7 % — ABNORMAL HIGH (ref <5.7)
Mean Plasma Glucose: 146 mg/dL

## 2016-01-03 NOTE — Assessment & Plan Note (Signed)
Not ideal; will not increase med; try weight loss and DASH guidelines

## 2016-01-03 NOTE — Assessment & Plan Note (Signed)
Check A1c. 

## 2016-01-03 NOTE — Assessment & Plan Note (Signed)
Check lipids 

## 2016-01-04 LAB — MICROALBUMIN / CREATININE URINE RATIO
CREATININE, URINE: 202 mg/dL (ref 20–320)
MICROALB UR: 1.7 mg/dL
MICROALB/CREAT RATIO: 8 ug/mg{creat} (ref <30)

## 2016-01-05 ENCOUNTER — Encounter: Payer: Self-pay | Admitting: Family Medicine

## 2016-01-08 NOTE — Assessment & Plan Note (Addendum)
Explained serious warning of combining opiates with benzos, in case pt ever has surgery or injury or is given Rx for anything containing a narcotic; could cause accidental fatal overdose; discussed the Aug 2016 FDA press release regarding this; if taking daily, do not ever stop abruptly b/c of risk of seizures; talk to provider about weaning down; never ever mix with alcohol, and do not combine benzo with alcohol within six hours of each other

## 2016-01-10 ENCOUNTER — Encounter: Payer: Self-pay | Admitting: Family Medicine

## 2016-01-10 ENCOUNTER — Ambulatory Visit (INDEPENDENT_AMBULATORY_CARE_PROVIDER_SITE_OTHER): Payer: BLUE CROSS/BLUE SHIELD | Admitting: Family Medicine

## 2016-01-10 VITALS — BP 138/90 | HR 101 | Temp 97.6°F | Resp 14 | Wt 242.0 lb

## 2016-01-10 DIAGNOSIS — F3342 Major depressive disorder, recurrent, in full remission: Secondary | ICD-10-CM | POA: Diagnosis not present

## 2016-01-10 DIAGNOSIS — E785 Hyperlipidemia, unspecified: Secondary | ICD-10-CM

## 2016-01-10 DIAGNOSIS — I10 Essential (primary) hypertension: Secondary | ICD-10-CM | POA: Diagnosis not present

## 2016-01-10 DIAGNOSIS — F902 Attention-deficit hyperactivity disorder, combined type: Secondary | ICD-10-CM | POA: Diagnosis not present

## 2016-01-10 DIAGNOSIS — E119 Type 2 diabetes mellitus without complications: Secondary | ICD-10-CM

## 2016-01-10 MED ORDER — ATORVASTATIN CALCIUM 20 MG PO TABS: 20.0000 mg | ORAL_TABLET | Freq: Every day | ORAL | Status: DC

## 2016-01-10 MED ORDER — SERTRALINE HCL 100 MG PO TABS: 150.0000 mg | ORAL_TABLET | Freq: Every day | ORAL | Status: DC

## 2016-01-10 MED ORDER — LISDEXAMFETAMINE DIMESYLATE 40 MG PO CAPS: 40.0000 mg | ORAL_CAPSULE | ORAL | Status: DC

## 2016-01-10 MED ORDER — VALSARTAN 40 MG PO TABS: 40.0000 mg | ORAL_TABLET | Freq: Every day | ORAL | Status: DC

## 2016-01-10 MED ORDER — METFORMIN HCL 1000 MG PO TABS: 1000.0000 mg | ORAL_TABLET | Freq: Two times a day (BID) | ORAL | Status: DC

## 2016-01-10 NOTE — Patient Instructions (Addendum)
Please do call your cardiologist Try to limit saturated fats in your diet (bologna, hot dogs, barbeque, cheeseburgers, hamburgers, steak, bacon, sausage, cheese, etc.) and get more fresh fruits, vegetables, and whole grains  Check out the information at familydoctor.org entitled "Nutrition for Weight Loss: What You Need to Know about Fad Diets" Try to lose between 1-2 pounds per week by taking in fewer calories and burning off more calories You can succeed by limiting portions, limiting foods dense in calories and fat, becoming more active, and drinking 8 glasses of water a day (64 ounces) Don't skip meals, especially breakfast, as skipping meals may alter your metabolism Do not use over-the-counter weight loss pills or gimmicks that claim rapid weight loss A healthy BMI (or body mass index) is between 18.5 and 24.9 You can calculate your ideal BMI at the [employer] website [employer]fr  Please call Dr. Clayborn Bigness to see about any further testing I recommend that you stop Vyvanse if you are worried that you have a blockage UNTIL he clears you and says you are fine to continue that; you are at risk for a heart attack on the Vyvanse  Start new blood pressure 40 mg daily Start atorvastatin 20 mg at bedtime  Return for CMA visit in 3 weeks for BP recheck and BMP (non-fasting) Return to see me in a little over 6 weeks, have labs checked the day before

## 2016-01-10 NOTE — Progress Notes (Signed)
BP 138/90   Pulse (!) 101   Temp 97.6 F (36.4 C) (Oral)   Resp 14   Wt 242 lb (109.8 kg)   LMP 12/26/2015   SpO2 98%   BMI 40.27 kg/m    Subjective:    Patient ID: Melanie Villarreal, female    DOB: 03/13/1976, 40 y.o.   MRN: [id number]  HPI: Melanie Villarreal is a 40 y.o. female  Chief Complaint  Patient presents with  . Follow-up    1 week  . Diabetes  . 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; -7.5 vision Not excessive thirst or frequent urination  High cholesterol; mother had first stent at age 55; patient went to cardiologist not long ago;  Saw cardiologist, murmur, chest pain; had a stress test; echo was normal; LVEF 55% Cannot have babies, tubes tied and Novasure  Lab Results  Component Value Date   CHOL 226 (H) 01/03/2016   HDL 29 (L) 01/03/2016   LDLCALC 143 (H) 01/03/2016   TRIG 270 (H) 01/03/2016   CHOLHDL 7.8 (H) 01/03/2016   Obesity; down 8 pounds since March, over 3 months;  118 pounds to 130 after 1st baby, 141 to 136 after 2nd baby, 167 after 3rd baby  Improved mood after dose adjustment; improved libido  Depression screen [facility] 2/9 12/30/2015 08/30/2015 12/31/2014  Decreased Interest 0 0 0  Down, Depressed, Hopeless 0 0 0  PHQ - 2 Score 0 0 0   Relevant past medical, surgical, family and social history reviewed Past Medical History:  Diagnosis Date  . ADHD (attention deficit hyperactivity disorder), combined type   . Allergy     . Anxiety   . Depression, controlled   . Diabetes mellitus without complication ([place])   . Generalized anxiety disorder 12/31/2014  . HTN, goal below 140/90   . Hyperlipidemia   . Hypertension goal BP (blood pressure) < 130/80 10/25/2014  . Morbid obesity ([place])   . Panic disorder with agoraphobia and moderate panic attacks 12/30/2015   Past Surgical History:  Procedure Laterality Date  . CESAREAN SECTION    . TUBAL LIGATION     Family History  Problem Relation Age of Onset  . Heart disease Mother   . Hyperlipidemia Mother   . Hypertension Mother   . Miscarriages / Stillbirths Mother     2  . Anxiety disorder Mother   . Alcohol abuse Father   . Cancer Father     testicular  . Depression Father   . Heart disease Sister   . Hyperlipidemia Sister   . Hypertension Sister   . Drug abuse Brother   . Alcohol abuse Brother   . Depression Brother   . ADD / ADHD Son   . Mental illness Maternal Aunt   . Alcohol abuse Maternal Grandfather   . Depression Maternal Grandfather   . Anxiety disorder Maternal Grandfather   . Cancer Paternal Grandmother   .  Alcohol abuse Paternal Grandfather    Social History  Substance Use Topics  . Smoking status: Former Smoker    Types: Cigarettes    Quit date: 11/28/2015  . Smokeless tobacco: Never Used  . Alcohol use 0.0 oz/week   Interim medical history since last visit reviewed. Allergies and medications reviewed  Review of Systems Per HPI unless specifically indicated above     Objective:    BP 138/90   Pulse (!) 101   Temp 97.6 F (36.4 C) (Oral)   Resp 14   Wt 242 lb (109.8 kg)   LMP 12/26/2015   SpO2 98%   BMI 40.27 kg/m   Wt Readings from Last 3 Encounters:  01/10/16 242 lb (109.8 kg)  12/30/15 243 lb (110.2 kg)  09/28/15 250 lb (113.4 kg)   *NOT on losartan*  Physical Exam  Constitutional: She appears well-developed and well-nourished. No distress.  HENT:  Head: Normocephalic and atraumatic.  Eyes: EOM are normal.  No scleral icterus.  Neck: No thyromegaly present.  Cardiovascular: Regular rhythm and normal heart sounds.  Tachycardia present.   Right above 100 bpm during auscultation  Pulmonary/Chest: Effort normal and breath sounds normal.  Abdominal: Soft. Bowel sounds are normal. She exhibits no distension.  Musculoskeletal: Normal range of motion. She exhibits no edema.  Neurological: She is alert.  Skin: Skin is warm and dry. She is not diaphoretic. No pallor.  Psychiatric: She has a normal mood and affect. Her behavior is normal. Judgment and thought content normal. Her mood appears not anxious. She does not exhibit a depressed mood.  Good eye contact with examiner   Diabetic Foot Form - Detailed   Diabetic Foot Exam - detailed Diabetic Foot exam was performed with the following findings:  Yes 01/10/2016  7:15 PM  Visual Foot Exam completed.:  Yes  Are the toenails ingrown?:  No Normal Range of Motion:  Yes Pulse Foot Exam completed.:  Yes  Right Dorsalis Pedis:  Present Left Dorsalis Pedis:  Present  Sensory Foot Exam Completed.:  Yes Semmes-Weinstein Monofilament Test R Site 1-Great Toe:  Pos L Site 1-Great Toe:  Pos  R Site 4:  Pos L Site 4:  Pos  R Site 5:  Pos L Site 5:  Pos       Results for orders placed or performed in visit on 12/30/15  Hemoglobin A1c  Result Value Ref Range   Hgb A1c MFr Bld 6.7 (H) <5.7 %   Mean Plasma Glucose 146 mg/dL  Lipid panel  Result Value Ref Range   Cholesterol 226 (H) 125 - 200 mg/dL   Triglycerides 270 (H) <150 mg/dL   HDL 29 (L) >=46 mg/dL   Total CHOL/HDL Ratio 7.8 (H) <=5.0 Ratio   VLDL 54 (H) <30 mg/dL   LDL Cholesterol 143 (H) <130 mg/dL  COMPLETE METABOLIC PANEL WITH GFR  Result Value Ref Range   Sodium 138 135 - 146 mmol/L   Potassium 4.3 3.5 - 5.3 mmol/L   Chloride 102 98 - 110 mmol/L   CO2 23 20 - 31 mmol/L   Glucose, Bld 127 (H) 65 - 99 mg/dL   BUN 10 7 - 25 mg/dL   Creat 0.70 0.50 - 1.10 mg/dL   Total Bilirubin 0.9 0.2 -  1.2 mg/dL   Alkaline Phosphatase 71 33 - 115 U/L   AST 26 10 - 30 U/L   ALT 43 (H) 6 - 29 U/L   Total Protein 7.1 6.1 - 8.1 g/dL  Albumin 4.4 3.6 - 5.1 g/dL   Calcium 9.3 8.6 - 10.2 mg/dL   GFR, Est African American >89 >=60 mL/min   GFR, Est Non African American >89 >=60 mL/min  Microalbumin / creatinine urine ratio  Result Value Ref Range   Creatinine, Urine 202 20 - 320 mg/dL   Microalb, Ur 1.7 Not estab mg/dL   Microalb Creat Ratio 8 <30 mcg/mg creat  CBC with Differential/Platelet  Result Value Ref Range   WBC 8.4 3.8 - 10.8 K/uL   RBC 4.94 3.80 - 5.10 MIL/uL   Hemoglobin 14.2 11.7 - 15.5 g/dL   HCT 41.9 35.0 - 45.0 %   MCV 84.8 80.0 - 100.0 fL   MCH 28.7 27.0 - 33.0 pg   MCHC 33.9 32.0 - 36.0 g/dL   RDW 14.1 11.0 - 15.0 %   Platelets 342 140 - 400 K/uL   MPV 10.8 7.5 - 12.5 fL   Neutro Abs 4,788 1,500 - 7,800 cells/uL   Lymphs Abs 3,024 850 - 3,900 cells/uL   Monocytes Absolute 504 200 - 950 cells/uL   Eosinophils Absolute 84 15 - 500 cells/uL   Basophils Absolute 0 0 - 200 cells/uL   Neutrophils Relative % 57 %   Lymphocytes Relative 36 %   Monocytes Relative 6 %   Eosinophils Relative 1 %   Basophils Relative 0 %   Smear Review Criteria for review not met       Assessment & Plan:   Problem List Items Addressed This Visit      Cardiovascular and Mediastinum   Hypertension goal BP (blood pressure) < 130/80    Not controlled; see AVS; patient to get weight down and control BP; elevated pressure can increase risk of stroke with stimulant for ADHD; she is aware      Relevant Medications   valsartan (DIOVAN) 40 MG tablet   atorvastatin (LIPITOR) 20 MG tablet   aspirin EC 81 MG tablet     Endocrine   Diabetes mellitus type 2, controlled, without complications (HCC) (Chronic)    Foot exam by MD; work on weight loss; check A1c every 6 months if under 7, every 3 months if 7 or over; healthy eating      Relevant Medications   valsartan (DIOVAN) 40 MG tablet    metFORMIN (GLUCOPHAGE) 1000 MG tablet   atorvastatin (LIPITOR) 20 MG tablet   aspirin EC 81 MG tablet     Other   Obesity, Class III, BMI 40-49.9 (morbid obesity) ([place])    Work on weight loss; see AVS      Relevant Medications   lisdexamfetamine (VYVANSE) 40 MG capsule   metFORMIN (GLUCOPHAGE) 1000 MG tablet   Major depressive disorder, recurrent, in full remission with anxious distress (HCC)   Relevant Medications   sertraline (ZOLOFT) 100 MG tablet   HLD (hyperlipidemia) - Primary    Reviewed last lipid panel; limit saturated fats;       Relevant Medications   valsartan (DIOVAN) 40 MG tablet   atorvastatin (LIPITOR) 20 MG tablet   aspirin EC 81 MG tablet   ADHD (attention deficit hyperactivity disorder), combined type    Patient to check with cardiologist to see if any additional testing needed for cardiac clearance; in hte meantime, STOP stimulant medication      Relevant Medications   lisdexamfetamine (VYVANSE) 40 MG capsule    Other Visit Diagnoses    Hypertension goal BP (blood pressure) < 140/90       Relevant Medications  valsartan (DIOVAN) 40 MG tablet   atorvastatin (LIPITOR) 20 MG tablet   aspirin EC 81 MG tablet      Follow up plan: Return in about 3 weeks (around 01/31/2016) for CMA visit, NON-fasting labs; 6+ weeks with Dr. Sanda Klein (labs the day before).  An after-visit summary was printed and given to the patient at [place].  Please see the patient instructions which may contain other information and recommendations beyond what is mentioned above in the assessment and plan.  Meds ordered this encounter  Medications  . valsartan (DIOVAN) 40 MG tablet    Sig: Take 1 tablet (40 mg total) by mouth daily. For blood pressure and kidney protection    Dispense:  30 tablet    Refill:  2    STOP LOSARTAN  . sertraline (ZOLOFT) 100 MG tablet    Sig: Take 1.5 tablets (150 mg total) by mouth daily.    Dispense:  45 tablet    Refill:  6  . lisdexamfetamine  (VYVANSE) 40 MG capsule    Sig: Take 1 capsule (40 mg total) by mouth every morning.    Dispense:  30 capsule    Refill:  0    Okay to fill on or afer 01/28/16  . metFORMIN (GLUCOPHAGE) 1000 MG tablet    Sig: Take 1 tablet (1,000 mg total) by mouth 2 (two) times daily with a meal.    Dispense:  60 tablet    Refill:  5  . atorvastatin (LIPITOR) 20 MG tablet    Sig: Take 1 tablet (20 mg total) by mouth at bedtime.    Dispense:  30 tablet    Refill:  2  . aspirin EC 81 MG tablet    Sig: Take 1 tablet (81 mg total) by mouth daily.    No orders of the defined types were placed in this encounter.

## 2016-01-10 NOTE — Assessment & Plan Note (Signed)
Reviewed last lipid panel; limit saturated fats 

## 2016-01-11 ENCOUNTER — Encounter: Payer: Self-pay | Admitting: Family Medicine

## 2016-01-11 DIAGNOSIS — I38 Endocarditis, valve unspecified: Secondary | ICD-10-CM | POA: Insufficient documentation

## 2016-01-13 ENCOUNTER — Other Ambulatory Visit: Payer: Self-pay | Admitting: Family Medicine

## 2016-01-14 NOTE — Telephone Encounter (Signed)
We discussed at her last visit that she was going to contact Dr. Clayborn Bigness about seeing if any further testing needed before starting back on Vyvanse; I am not comfortable giving her any more Vyvanse until she finishes up her cardiac work-up with Dr. Clayborn Bigness; she may need endovascular [place] or cardiac cath; Vyvanse can significantly increase her risk of a heart attack, especially with her cholesterol panel When he has finished working her up, please have him send me a note clearing her for stimulant ADHD therapy

## 2016-01-16 ENCOUNTER — Telehealth: Payer: Self-pay | Admitting: Family Medicine

## 2016-01-16 ENCOUNTER — Encounter: Payer: Self-pay | Admitting: Family Medicine

## 2016-01-16 NOTE — Telephone Encounter (Signed)
I see the letter from Dr. Clayborn Bigness now dated today that he gives clearance for her to be on stimulant medication Rx is ready to pick up; thank you

## 2016-01-16 NOTE — Telephone Encounter (Signed)
Please see the other phone note; we need to have her cardiologist clear her first; thank you

## 2016-01-16 NOTE — Telephone Encounter (Signed)
Pt states she did not get the paper RX for Vyvance from her last appt. Please advise

## 2016-01-16 NOTE — Telephone Encounter (Signed)
Pt needs is asking for her Lorazapam to be filled.

## 2016-01-18 NOTE — Telephone Encounter (Signed)
Pt informed of RX being ready

## 2016-02-05 NOTE — Assessment & Plan Note (Signed)
Foot exam by MD; work on weight loss; check A1c every 6 months if under 7, every 3 months if 7 or over; healthy eating

## 2016-02-05 NOTE — Assessment & Plan Note (Signed)
Not controlled; see AVS; patient to get weight down and control BP; elevated pressure can increase risk of stroke with stimulant for ADHD; she is aware

## 2016-02-05 NOTE — Assessment & Plan Note (Signed)
Patient to check with cardiologist to see if any additional testing needed for cardiac clearance; in hte meantime, STOP stimulant medication

## 2016-02-05 NOTE — Assessment & Plan Note (Signed)
Work on weight loss; see AVS 

## 2016-02-16 ENCOUNTER — Other Ambulatory Visit: Payer: Self-pay

## 2016-02-16 ENCOUNTER — Other Ambulatory Visit: Payer: Self-pay | Admitting: Family Medicine

## 2016-02-16 DIAGNOSIS — F411 Generalized anxiety disorder: Secondary | ICD-10-CM

## 2016-02-16 DIAGNOSIS — F902 Attention-deficit hyperactivity disorder, combined type: Secondary | ICD-10-CM

## 2016-02-16 DIAGNOSIS — F4001 Agoraphobia with panic disorder: Secondary | ICD-10-CM

## 2016-02-16 NOTE — Telephone Encounter (Signed)
Patient is needing an authorization for vyvanse

## 2016-02-17 MED ORDER — LORAZEPAM 0.5 MG PO TABS: 0.2500 mg | ORAL_TABLET | Freq: Every evening | ORAL | 0 refills | Status: DC | PRN

## 2016-02-17 NOTE — Telephone Encounter (Signed)
I don't see any record that patient came in as requested 3 weeks after last visit for recheck of pulse and BP Please ask her to come in for CMA visit for weight, pulse, BP next week [facility] website reviewed; will continue to wean down benzo even further

## 2016-02-17 NOTE — Telephone Encounter (Signed)
I'm not approving any more Vyvanse until she is seen and has her blood pressure and pulse checked; she was supposed to return a few weeks after her last visit No more Vyvanse until vitals controlled

## 2016-02-17 NOTE — Telephone Encounter (Signed)
When I talked to patient earlier she states her cardiologist is suppost to be sending notes to you and that she would come and get bloodwork next week.

## 2016-02-17 NOTE — Telephone Encounter (Signed)
SEnt prior auth through ins. Waiting

## 2016-02-17 NOTE — Telephone Encounter (Signed)
Pt notified and reminded to come in.

## 2016-02-23 ENCOUNTER — Telehealth: Payer: Self-pay | Admitting: Family Medicine

## 2016-02-23 DIAGNOSIS — Z79899 Other long term (current) drug therapy: Secondary | ICD-10-CM

## 2016-02-23 DIAGNOSIS — F4001 Agoraphobia with panic disorder: Secondary | ICD-10-CM

## 2016-02-23 DIAGNOSIS — F902 Attention-deficit hyperactivity disorder, combined type: Secondary | ICD-10-CM

## 2016-02-23 DIAGNOSIS — F411 Generalized anxiety disorder: Secondary | ICD-10-CM

## 2016-02-23 DIAGNOSIS — F3342 Major depressive disorder, recurrent, in full remission: Secondary | ICD-10-CM

## 2016-02-23 NOTE — Telephone Encounter (Signed)
She says everything was okay for [employer] says she was approved; she started new insurance She had tried Oncologist and adderall in the past They declined the vyvanse because she needs to try adderall, made her chest hurt She says her BP was 123/84 yesterday and she was off of BP meds for 3 days I explained that reading my note from 02/16/16 I am not going to approve ANY more stimulants until I see her back and verify that her BP and pulse are okay That worries me too that she stopped her BP for 3 days, I explained that uncontrolled hypertension She had a mental breakdown in December She got upset with me; said I lied to her about giving her medicine if she got cardiac clearance I recommended that she see a psychiatrist to manage her psych needs; she spoke with office manager and agreed with referral

## 2016-02-23 NOTE — Telephone Encounter (Signed)
Spoke with patient regarding Vyvanse medication authorization and that Dr. Sanda Klein wasn't going to approve anymore due to her BP and pulse readings.  Informed patient that I could not override the provider's decision.  I also informed patient that she may want to consider finding another physician due her being unhappy with Dr. Delight Ovens decision.  Patient stated that she would just like to get a referral to a psychiatrist. I informed patient that I would have Dr. Sanda Klein put in a referral and I will let her know once the referral has been placed.

## 2016-02-23 NOTE — Assessment & Plan Note (Signed)
Refer to psych 

## 2016-02-24 ENCOUNTER — Other Ambulatory Visit: Payer: Self-pay | Admitting: Family Medicine

## 2016-02-24 ENCOUNTER — Other Ambulatory Visit: Payer: Self-pay

## 2016-02-24 ENCOUNTER — Other Ambulatory Visit: Payer: BLUE CROSS/BLUE SHIELD

## 2016-02-24 DIAGNOSIS — R000 Tachycardia, unspecified: Secondary | ICD-10-CM

## 2016-02-24 DIAGNOSIS — E785 Hyperlipidemia, unspecified: Secondary | ICD-10-CM

## 2016-02-24 DIAGNOSIS — Z5181 Encounter for therapeutic drug level monitoring: Secondary | ICD-10-CM

## 2016-02-24 LAB — LIPID PANEL
CHOL/HDL RATIO: 3.1 ratio (ref <=5.0)
CHOLESTEROL: 106 mg/dL — AB (ref 125–200)
HDL: 34 mg/dL — ABNORMAL LOW (ref >=46)
LDL Cholesterol: 41 mg/dL (ref <130)
Triglycerides: 154 mg/dL — ABNORMAL HIGH (ref <150)
VLDL: 31 mg/dL — AB (ref <30)

## 2016-02-24 LAB — ALT: ALT: 32 U/L — AB (ref 6–29)

## 2016-02-24 NOTE — Assessment & Plan Note (Signed)
Check sgpt 

## 2016-02-24 NOTE — Assessment & Plan Note (Signed)
Check lipids 

## 2016-02-24 NOTE — Assessment & Plan Note (Signed)
Check tsh 

## 2016-02-24 NOTE — Progress Notes (Signed)
Enter lab orders

## 2016-03-08 ENCOUNTER — Telehealth: Payer: Self-pay | Admitting: Family Medicine

## 2016-03-08 DIAGNOSIS — R000 Tachycardia, unspecified: Secondary | ICD-10-CM

## 2016-03-08 NOTE — Telephone Encounter (Signed)
Please check on results for the TSH from Aug 11th; her ALT and lipids are back, but no TSH; redraw if they don't have it; thank you

## 2016-03-09 NOTE — Telephone Encounter (Signed)
Left voice mail

## 2016-03-09 NOTE — Telephone Encounter (Signed)
It was that one ordered wrong that states needs to be collected

## 2016-03-09 NOTE — Assessment & Plan Note (Signed)
Check TSH 

## 2016-03-09 NOTE — Telephone Encounter (Signed)
Thank you; I entered it as "clinic collect" so it didn't print; my mistake I have entered a new order Thank you

## 2016-03-13 ENCOUNTER — Other Ambulatory Visit: Payer: Self-pay | Admitting: Family Medicine

## 2016-03-13 NOTE — Telephone Encounter (Signed)
[facility] website reviewed Twenty pills just filled 02/17/16 She has been referred to psychiatry I'll prefer that she get all controlled substances from psych going forward

## 2016-03-23 ENCOUNTER — Encounter: Payer: Self-pay | Admitting: Family Medicine

## 2016-03-23 ENCOUNTER — Ambulatory Visit (INDEPENDENT_AMBULATORY_CARE_PROVIDER_SITE_OTHER): Payer: BLUE CROSS/BLUE SHIELD | Admitting: Family Medicine

## 2016-03-23 DIAGNOSIS — F411 Generalized anxiety disorder: Secondary | ICD-10-CM | POA: Diagnosis not present

## 2016-03-23 DIAGNOSIS — E785 Hyperlipidemia, unspecified: Secondary | ICD-10-CM

## 2016-03-23 DIAGNOSIS — F902 Attention-deficit hyperactivity disorder, combined type: Secondary | ICD-10-CM | POA: Diagnosis not present

## 2016-03-23 DIAGNOSIS — F3342 Major depressive disorder, recurrent, in full remission: Secondary | ICD-10-CM

## 2016-03-23 DIAGNOSIS — I10 Essential (primary) hypertension: Secondary | ICD-10-CM

## 2016-03-23 DIAGNOSIS — E119 Type 2 diabetes mellitus without complications: Secondary | ICD-10-CM | POA: Diagnosis not present

## 2016-03-23 DIAGNOSIS — E669 Obesity, unspecified: Secondary | ICD-10-CM

## 2016-03-23 MED ORDER — VALSARTAN 40 MG PO TABS: 60.0000 mg | ORAL_TABLET | Freq: Every day | ORAL | 2 refills | Status: DC

## 2016-03-23 NOTE — Assessment & Plan Note (Addendum)
Check A1c every 6 months since currently controlled and under 7; foot exam by MD today; need to get BP down, so increasing the ARB

## 2016-03-23 NOTE — Assessment & Plan Note (Signed)
Managed now by psychiatrist

## 2016-03-23 NOTE — Assessment & Plan Note (Signed)
Increase valsartan from 40 mg daily to 60 mg daily; pt did not sound eager to double the dose to 80 mg daily; return in 3 weeks to see CMA for BP recheck and BMP (nonfasting); continue to work on weight loss and DASH guidelines

## 2016-03-23 NOTE — Patient Instructions (Addendum)
I do recommend yearly flu shots; for individuals who don't want flu shots, try to practice excellent hand hygiene, and avoid nursing homes, day cares, and hospitals during peak flu season; taking additional vitamin C daily during flu/cold season may help boost your immune system too  Return on or after December 21st for fasting labs Increase the valsartan from 40 mg daily to 60 mg daily Return in 2-3 weeks to see Roselyn Reef for BP check and BMP and TSH (labs)  Check out the information at familydoctor.org entitled "Nutrition for Weight Loss: What You Need to Know about Fad Diets" Try to lose between 1-2 pounds per week by taking in fewer calories and burning off more calories You can succeed by limiting portions, limiting foods dense in calories and fat, becoming more active, and drinking 8 glasses of water a day (64 ounces) Don't skip meals, especially breakfast, as skipping meals may alter your metabolism Do not use over-the-counter weight loss pills or gimmicks that claim rapid weight loss A healthy BMI (or body mass index) is between 18.5 and 24.9 You can calculate your ideal BMI at the [place] website [employer]fr  Try to limit saturated fats in your diet (bologna, hot dogs, barbeque, cheeseburgers, hamburgers, steak, bacon, sausage, cheese, etc.) and get more fresh fruits, vegetables, and whole grains

## 2016-03-23 NOTE — Assessment & Plan Note (Signed)
Praise given; continue to work on weight loss

## 2016-03-23 NOTE — Progress Notes (Signed)
BP (!) 138/98   Pulse 95   Temp 97.6 F (36.4 C) (Oral)   Resp 16   Wt 238 lb (108 kg)   LMP 01/26/2016   SpO2 97%   BMI 39.61 kg/m   BP 136/96  Subjective:    Patient ID: Melanie Villarreal, female    DOB: Dec 19, 1975, 40 y.o.   MRN: [id number]  HPI: Melanie Villarreal is a 40 y.o. female  Chief Complaint  Patient presents with  . Medication Refill   Type 2 diabetes; checking sugars; low end 90 and high end 170 (post-prandial); rarely goes up to 196 after cake; trying to do less sweets  Problem with feet, for the last week and a half, she had numbness in the fingers and toes; she does not want medicine  High cholesterol; on atoravastatin; no pain in muscle; [place] bacon, no sausage; limiting cheese; no more than 3 eggs per week; one hot dog in six months; no bologna; does eat steak (but feels like crap)  Had coffee right before coming in; bp up a little this morning; doing [place] salt, but not much; no extra table salt  Seeing therapist and Darrick Meigs and super kind and she will watch her issue; she will handle all mental health Rxs  Depression screen [facility] 2/9 03/23/2016 12/30/2015 08/30/2015 12/31/2014  Decreased Interest 0 0 0 0  Down, Depressed, Hopeless 1 0 0 0  PHQ - 2 Score 1 0 0 0   Relevant past medical, surgical, family and social history reviewed Past Medical History:  Diagnosis Date  . ADHD (attention deficit hyperactivity disorder), combined type   . Allergy   . Anxiety   . Depression, controlled   . Diabetes mellitus without complication ([place])   . Generalized anxiety disorder 12/31/2014  . HTN, goal below 140/90   . Hyperlipidemia   . Hypertension goal BP (blood pressure) < 130/80 10/25/2014  . Morbid obesity ([place])   . Panic disorder with agoraphobia and moderate panic attacks 12/30/2015   Past Surgical History:  Procedure Laterality Date  . CESAREAN SECTION    . TUBAL LIGATION     Family History  Problem Relation Age of Onset  . Heart disease Mother     . Hyperlipidemia Mother   . Hypertension Mother   . Miscarriages / Stillbirths Mother     2  . Anxiety disorder Mother   . Alcohol abuse Father   . Cancer Father     testicular  . Depression Father   . Heart disease Sister   . Hyperlipidemia Sister   . Hypertension Sister   . Drug abuse Brother   . Alcohol abuse Brother   . Depression Brother   . ADD / ADHD Son   . Mental illness Maternal Aunt   . Alcohol abuse Maternal Grandfather   . Depression Maternal Grandfather   . Anxiety disorder Maternal Grandfather   . Cancer Paternal Grandmother   . Alcohol abuse Paternal Grandfather    Social History  Substance Use Topics  . Smoking status: Former Smoker    Types: Cigarettes    Quit date: 11/28/2015  . Smokeless tobacco: Never Used  . Alcohol use 0.0 oz/week   Interim medical history since last visit reviewed. Allergies and medications reviewed  Review of Systems Per HPI unless specifically indicated above     Objective:    BP (!) 138/98   Pulse 95   Temp 97.6 F (36.4 C) (Oral)   Resp 16   Wt  238 lb (108 kg)   LMP 01/26/2016   SpO2 97%   BMI 39.61 kg/m   Wt Readings from Last 3 Encounters:  03/23/16 238 lb (108 kg)  01/10/16 242 lb (109.8 kg)  12/30/15 243 lb (110.2 kg)    Physical Exam  Constitutional: She appears well-developed and well-nourished. No distress.  Obese, weight down 4 pounds  HENT:  Head: Normocephalic and atraumatic.  Eyes: EOM are normal. No scleral icterus.  Neck: No thyromegaly present.  Cardiovascular: Normal rate, regular rhythm and normal heart sounds.   Pulmonary/Chest: Effort normal and breath sounds normal.  Abdominal: Soft. Bowel sounds are normal. She exhibits no distension.  Musculoskeletal: She exhibits no edema.  Neurological: She is alert.  Skin: Skin is warm and dry. She is not diaphoretic. No pallor.  Psychiatric: She has a normal mood and affect. Her behavior is normal. Judgment and thought content normal.    Diabetic Foot Form - Detailed   Diabetic Foot Exam - detailed Diabetic Foot exam was performed with the following findings:  Yes 03/23/2016 10:54 AM  Visual Foot Exam completed.:  Yes  Are the toenails ingrown?:  No Normal Range of Motion:  Yes Pulse Foot Exam completed.:  Yes  Right Dorsalis Pedis:  Present Left Dorsalis Pedis:  Present  Sensory Foot Exam Completed.:  Yes Swelling:  No Semmes-Weinstein Monofilament Test R Site 1-Great Toe:  Pos L Site 1-Great Toe:  Pos  R Site 4:  Pos L Site 4:  Pos  R Site 5:  Pos L Site 5:  Pos       Results for orders placed or performed in visit on 02/24/16  Lipid panel  Result Value Ref Range   Cholesterol 106 (L) 125 - 200 mg/dL   Triglycerides 154 (H) <150 mg/dL   HDL 34 (L) >=46 mg/dL   Total CHOL/HDL Ratio 3.1 <=5.0 Ratio   VLDL 31 (H) <30 mg/dL   LDL Cholesterol 41 <130 mg/dL  ALT  Result Value Ref Range   ALT 32 (H) 6 - 29 U/L      Assessment & Plan:   Problem List Items Addressed This Visit      Cardiovascular and Mediastinum   Hypertension goal BP (blood pressure) < 130/80    Increase valsartan from 40 mg daily to 60 mg daily; pt did not sound eager to double the dose to 80 mg daily; return in 3 weeks to see CMA for BP recheck and BMP (nonfasting); continue to work on weight loss and DASH guidelines      Relevant Medications   valsartan (DIOVAN) 40 MG tablet   Other Relevant Orders   BASIC METABOLIC PANEL WITH GFR     Endocrine   Diabetes mellitus type 2, controlled, without complications (HCC) (Chronic)    Check A1c every 6 months since currently controlled and under 7; foot exam by MD today; need to get BP down, so increasing the ARB      Relevant Medications   valsartan (DIOVAN) 40 MG tablet     Other   Obesity    Praise given; continue to work on weight loss      Major depressive disorder, recurrent, in full remission with anxious distress ([place])    Managed now by psychiatrist      Relevant Medications    LORazepam (ATIVAN) 1 MG tablet   HLD (hyperlipidemia)    Reviewed recent lipids; improvement noted; continue statin, weight loss      Relevant Medications  valsartan (DIOVAN) 40 MG tablet   Generalized anxiety disorder    Managed now by psychiatrist      ADHD (attention deficit hyperactivity disorder), combined type    Managed by psychiatrist       Other Visit Diagnoses   None.      Follow up plan: Return in about 3 months (around 07/05/2016) for fasting labs and visit; 3 weeks with Roselyn Reef for labs and BP.  An after-visit summary was printed and given to the patient at [place].  Please see the patient instructions which may contain other information and recommendations beyond what is mentioned above in the assessment and plan.  Meds ordered this encounter  Medications  . LORazepam (ATIVAN) 1 MG tablet    Sig: take 1/2 or 1 WHOLE tablet by mouth at bedtime if needed    Refill:  0  . valsartan (DIOVAN) 40 MG tablet    Sig: Take 1.5 tablets (60 mg total) by mouth daily. For blood pressure and kidney protection    Dispense:  45 tablet    Refill:  2    STOP LOSARTAN    Orders Placed This Encounter  Procedures  . BASIC METABOLIC PANEL WITH GFR

## 2016-03-23 NOTE — Assessment & Plan Note (Signed)
Managed by psychiatrist

## 2016-03-23 NOTE — Assessment & Plan Note (Signed)
Reviewed recent lipids; improvement noted; continue statin, weight loss

## 2016-03-28 ENCOUNTER — Telehealth: Payer: Self-pay | Admitting: Family Medicine

## 2016-03-28 MED ORDER — VALSARTAN 80 MG PO TABS: 80.0000 mg | ORAL_TABLET | Freq: Every day | ORAL | 0 refills | Status: DC

## 2016-03-28 NOTE — Telephone Encounter (Signed)
Please let pt know that insurance won't pay for 1.5 pills, we have to use 80 mg Can't use 60 mg I've sent new Rx for valsartan 80 mg daily Recheck labs and BP end of Sept (appt on schedule)

## 2016-04-10 ENCOUNTER — Other Ambulatory Visit: Payer: Self-pay | Admitting: Family Medicine

## 2016-04-10 NOTE — Telephone Encounter (Signed)
Reviewed labs from August 2017; Rx approved

## 2016-04-13 ENCOUNTER — Other Ambulatory Visit: Payer: Self-pay

## 2016-04-13 ENCOUNTER — Ambulatory Visit: Payer: BLUE CROSS/BLUE SHIELD

## 2016-04-13 VITALS — BP 116/82 | Resp 14 | Wt 237.0 lb

## 2016-04-13 DIAGNOSIS — I10 Essential (primary) hypertension: Secondary | ICD-10-CM

## 2016-04-13 DIAGNOSIS — R000 Tachycardia, unspecified: Secondary | ICD-10-CM

## 2016-04-13 LAB — BASIC METABOLIC PANEL WITH GFR
BUN: 10 mg/dL (ref 7–25)
CHLORIDE: 104 mmol/L (ref 98–110)
CO2: 24 mmol/L (ref 20–31)
Calcium: 9.6 mg/dL (ref 8.6–10.2)
Creat: 0.6 mg/dL (ref 0.50–1.10)
GFR, Est African American: >89 mL/min (ref >=60)
Glucose, Bld: 126 mg/dL — ABNORMAL HIGH (ref 65–99)
Potassium: 4.6 mmol/L (ref 3.5–5.3)
SODIUM: 139 mmol/L (ref 135–146)

## 2016-04-13 LAB — TSH: TSH: 2.4 mIU/L

## 2016-04-27 ENCOUNTER — Other Ambulatory Visit: Payer: Self-pay | Admitting: Family Medicine

## 2016-04-28 NOTE — Telephone Encounter (Signed)
Reviewed last BMP and BP; Rx approved

## 2016-06-28 ENCOUNTER — Ambulatory Visit: Payer: BLUE CROSS/BLUE SHIELD | Admitting: Family Medicine

## 2016-07-24 ENCOUNTER — Ambulatory Visit (INDEPENDENT_AMBULATORY_CARE_PROVIDER_SITE_OTHER): Payer: Commercial Managed Care - HMO | Admitting: Family Medicine

## 2016-07-24 ENCOUNTER — Encounter: Payer: Self-pay | Admitting: Family Medicine

## 2016-07-24 VITALS — BP 128/82 | HR 78 | Temp 98.3°F | Resp 14 | Wt 238.0 lb

## 2016-07-24 DIAGNOSIS — Z6839 Body mass index (BMI) 39.0-39.9, adult: Secondary | ICD-10-CM

## 2016-07-24 DIAGNOSIS — E6609 Other obesity due to excess calories: Secondary | ICD-10-CM

## 2016-07-24 DIAGNOSIS — R740 Nonspecific elevation of levels of transaminase and lactic acid dehydrogenase [LDH]: Secondary | ICD-10-CM

## 2016-07-24 DIAGNOSIS — E119 Type 2 diabetes mellitus without complications: Secondary | ICD-10-CM

## 2016-07-24 DIAGNOSIS — E782 Mixed hyperlipidemia: Secondary | ICD-10-CM | POA: Diagnosis not present

## 2016-07-24 DIAGNOSIS — I10 Essential (primary) hypertension: Secondary | ICD-10-CM | POA: Diagnosis not present

## 2016-07-24 DIAGNOSIS — R7401 Elevation of levels of liver transaminase levels: Secondary | ICD-10-CM

## 2016-07-24 DIAGNOSIS — IMO0001 Reserved for inherently not codable concepts without codable children: Secondary | ICD-10-CM

## 2016-07-24 MED ORDER — METFORMIN HCL 1000 MG PO TABS: 1000.0000 mg | ORAL_TABLET | Freq: Two times a day (BID) | ORAL | 5 refills | Status: DC

## 2016-07-24 NOTE — Assessment & Plan Note (Signed)
Foot exam by MD; limit starches and sweets

## 2016-07-24 NOTE — Assessment & Plan Note (Addendum)
Controlled today; weight loss would be helpful

## 2016-07-24 NOTE — Patient Instructions (Signed)
Keep working on weight loss efforts Please do see your eye doctor regularly, and have your eyes examined every year (or more often per his or her recommendation) Check your feet every night and let me know right away of any sores, infections, numbness, etc. Try to limit sweets, white bread, white rice, white potatoes It is okay with me for you to not check your fingerstick blood sugars (per [employer] of Endocrinology Best Practices), unless you are interested and feel it would be helpful for you Let's get labs today I do recommend yearly flu shots; for individuals who don't want flu shots, try to practice excellent hand hygiene, and avoid nursing homes, day cares, and hospitals during peak flu season; taking additional vitamin C daily during flu/cold season may help boost your immune system too

## 2016-07-24 NOTE — Assessment & Plan Note (Signed)
Check sgpt, may be fatty liver, and if still elevated, theng et [place]; if normal, no worries

## 2016-07-24 NOTE — Progress Notes (Signed)
BP 128/82   Pulse 78   Temp 98.3 F (36.8 C) (Oral)   Resp 14   Wt 238 lb (108 kg)   LMP 06/24/2016   SpO2 97%   BMI 39.61 kg/m    Subjective:    Patient ID: Melanie Villarreal, female    DOB: 12/12/1975, 41 y.o.   MRN: [id number]  HPI: BRIE GOWELL is a 41 y.o. female  Chief Complaint  Patient presents with  . Follow-up   Patient is here for follow-up  HTN; controlled here; checks BP away from here once a month; diastolic 95 if she eats beef; knows to stay away; not adding salt to food; knows to avoid decongestants; on ARB  Type 2 diabetes; brown rice if eating rice; not much white bread; just a bit of sugar; very rarely sweetened drinks; on metformin; last A1c was 6.7 in June of 2017; normal urine micr:Cr  High cholesterol; rare eggs; not many fatty meats at all; mostly chicken and fish; taking statin without any problems; ran out of statin; was out for just two days  She had a SGPT of 70 a year ago, then 43 and then down to 32; patient thinks med effect; something for pain, tylenol #3 at that time  tsh was normal in Sept; energy level has decreased; patient thinks she is losing weight, can feel clothes fit differently, not sure why not reflected in scales; she does have a lot of muscle  Depression screen [facility] 2/9 07/24/2016 03/23/2016 12/30/2015 08/30/2015 12/31/2014  Decreased Interest 0 0 0 0 0  Down, Depressed, Hopeless 0 1 0 0 0  PHQ - 2 Score 0 1 0 0 0   Relevant past medical, surgical, family and social history reviewed Past Medical History:  Diagnosis Date  . ADHD (attention deficit hyperactivity disorder), combined type   . Allergy   . Anxiety   . Depression, controlled   . Diabetes mellitus without complication ([place])   . Generalized anxiety disorder 12/31/2014  . HTN, goal below 140/90   . Hyperlipidemia   . Hypertension goal BP (blood pressure) < 130/80 10/25/2014  . Morbid obesity ([place])   . Panic disorder with agoraphobia and moderate panic attacks 12/30/2015    Past Surgical History:  Procedure Laterality Date  . CESAREAN SECTION    . TUBAL LIGATION     Family History  Problem Relation Age of Onset  . Heart disease Mother   . Hyperlipidemia Mother   . Hypertension Mother   . Miscarriages / Stillbirths Mother     2  . Anxiety disorder Mother   . Alcohol abuse Father   . Cancer Father     testicular  . Depression Father   . Heart disease Sister   . Hyperlipidemia Sister   . Hypertension Sister   . Drug abuse Brother   . Alcohol abuse Brother   . Depression Brother   . ADD / ADHD Son   . Mental illness Maternal Aunt   . Alcohol abuse Maternal Grandfather   . Depression Maternal Grandfather   . Anxiety disorder Maternal Grandfather   . Cancer Paternal Grandmother   . Alcohol abuse Paternal Grandfather    Social History  Substance Use Topics  . Smoking status: Former Smoker    Types: Cigarettes    Quit date: 11/28/2015  . Smokeless tobacco: Never Used  . Alcohol use 0.0 oz/week   Interim medical history since last visit reviewed. Allergies and medications reviewed  Review of Systems  Per HPI unless specifically indicated above     Objective:    BP 128/82   Pulse 78   Temp 98.3 F (36.8 C) (Oral)   Resp 14   Wt 238 lb (108 kg)   LMP 06/24/2016   SpO2 97%   BMI 39.61 kg/m   Wt Readings from Last 3 Encounters:  07/24/16 238 lb (108 kg)  04/13/16 237 lb (107.5 kg)  03/23/16 238 lb (108 kg)    Physical Exam  Constitutional: She appears well-developed and well-nourished. No distress.  Obese, weight stable  HENT:  Head: Normocephalic and atraumatic.  Eyes: EOM are normal. No scleral icterus.  Neck: No thyromegaly present.  Cardiovascular: Normal rate, regular rhythm and normal heart sounds.   Pulmonary/Chest: Effort normal and breath sounds normal.  Abdominal: Soft. Bowel sounds are normal. She exhibits no distension.  Musculoskeletal: She exhibits no edema.  Neurological: She is alert.  Skin: Skin is warm  and dry. She is not diaphoretic. No pallor.  Psychiatric: She has a normal mood and affect. Her behavior is normal. Judgment and thought content normal.   Diabetic Foot Form - Detailed   Diabetic Foot Exam - detailed Diabetic Foot exam was performed with the following findings:  Yes 07/24/2016 10:08 AM  Visual Foot Exam completed.:  Yes  Is there a history of foot ulcer?:  No Are the toenails ingrown?:  No Normal Range of Motion:  Yes Pulse Foot Exam completed.:  Yes  Right Dorsalis Pedis:  Present Left Dorsalis Pedis:  Present  Sensory Foot Exam Completed.:  Yes Swelling:  No Semmes-Weinstein Monofilament Test R Site 1-Great Toe:  Pos L Site 1-Great Toe:  Pos  R Site 4:  Pos L Site 4:  Pos  R Site 5:  Pos L Site 5:  Pos        Results for orders placed or performed in visit on 04/13/16  TSH  Result Value Ref Range   TSH 2.40 mIU/L  BASIC METABOLIC PANEL WITH GFR  Result Value Ref Range   Sodium 139 135 - 146 mmol/L   Potassium 4.6 3.5 - 5.3 mmol/L   Chloride 104 98 - 110 mmol/L   CO2 24 20 - 31 mmol/L   Glucose, Bld 126 (H) 65 - 99 mg/dL   BUN 10 7 - 25 mg/dL   Creat 0.60 0.50 - 1.10 mg/dL   Calcium 9.6 8.6 - 10.2 mg/dL   GFR, Est African American >89 >=60 mL/min   GFR, Est Non African American >89 >=60 mL/min      Assessment & Plan:   Problem List Items Addressed This Visit      Cardiovascular and Mediastinum   Hypertension goal BP (blood pressure) < 130/80 (Chronic)    Controlled today; weight loss would be helpful        Endocrine   Diabetes mellitus type 2, controlled, without complications (HCC) (Chronic)    Foot exam by MD; limit starches and sweets      Relevant Medications   metFORMIN (GLUCOPHAGE) 1000 MG tablet   Other Relevant Orders   Hemoglobin A1c   Lipid panel   COMPLETE METABOLIC PANEL WITH GFR     Other   Obesity - Primary    Encouraged weight loss; see AVS; if fatty liver, then weight loss and low fat diet should help; weight loss would  also help with glucose, cholesterol, and pressure most likely      Relevant Medications   VYVANSE 50 MG capsule  metFORMIN (GLUCOPHAGE) 1000 MG tablet   HLD (hyperlipidemia) (Chronic)    Check lipids, limit saturated fats      Elevated serum glutamic pyruvic transaminase (SGPT) level    Check sgpt, may be fatty liver, and if still elevated, theng et [place]; if normal, no worries      Relevant Orders   COMPLETE METABOLIC PANEL WITH GFR      Follow up plan: Return in about 6 months (around 01/21/2017) for fasting labs and visit.  An after-visit summary was printed and given to the patient at [place].  Please see the patient instructions which may contain other information and recommendations beyond what is mentioned above in the assessment and plan.  Meds ordered this encounter  Medications  . VYVANSE 50 MG capsule    Sig: Take 50 mg by mouth daily.    Refill:  0  . metFORMIN (GLUCOPHAGE) 1000 MG tablet    Sig: Take 1 tablet (1,000 mg total) by mouth 2 (two) times daily with a meal.    Dispense:  60 tablet    Refill:  5    Orders Placed This Encounter  Procedures  . Hemoglobin A1c  . Lipid panel  . COMPLETE METABOLIC PANEL WITH GFR

## 2016-07-25 NOTE — Assessment & Plan Note (Signed)
Check lipids, limit saturated fats 

## 2016-07-25 NOTE — Assessment & Plan Note (Signed)
Encouraged weight loss; see AVS; if fatty liver, then weight loss and low fat diet should help; weight loss would also help with glucose, cholesterol, and pressure most likely

## 2016-08-28 ENCOUNTER — Other Ambulatory Visit: Payer: Self-pay | Admitting: Family Medicine

## 2016-08-28 LAB — COMPLETE METABOLIC PANEL WITH GFR
ALBUMIN: 4.6 g/dL (ref 3.6–5.1)
ALT: 33 U/L — AB (ref 6–29)
AST: 22 U/L (ref 10–30)
Alkaline Phosphatase: 72 U/L (ref 33–115)
BILIRUBIN TOTAL: 1.1 mg/dL (ref 0.2–1.2)
BUN: 12 mg/dL (ref 7–25)
CO2: 26 mmol/L (ref 20–31)
CREATININE: 0.74 mg/dL (ref 0.50–1.10)
Calcium: 9.4 mg/dL (ref 8.6–10.2)
Chloride: 102 mmol/L (ref 98–110)
GFR, Est African American: >89 mL/min (ref >=60)
GFR, Est Non African American: >89 mL/min (ref >=60)
Glucose, Bld: 131 mg/dL — ABNORMAL HIGH (ref 65–99)
Potassium: 4.7 mmol/L (ref 3.5–5.3)
SODIUM: 138 mmol/L (ref 135–146)
TOTAL PROTEIN: 7.2 g/dL (ref 6.1–8.1)

## 2016-08-28 LAB — LIPID PANEL
Cholesterol: 123 mg/dL (ref <200)
HDL: 33 mg/dL — ABNORMAL LOW (ref >50)
LDL CALC: 51 mg/dL (ref <100)
Total CHOL/HDL Ratio: 3.7 Ratio (ref <5.0)
Triglycerides: 197 mg/dL — ABNORMAL HIGH (ref <150)
VLDL: 39 mg/dL — ABNORMAL HIGH (ref <30)

## 2016-08-29 LAB — HEMOGLOBIN A1C
HEMOGLOBIN A1C: 6.5 % — AB (ref <5.7)
MEAN PLASMA GLUCOSE: 140 mg/dL

## 2016-09-08 ENCOUNTER — Other Ambulatory Visit: Payer: Self-pay | Admitting: Family Medicine

## 2016-09-08 NOTE — Telephone Encounter (Signed)
Feb 2018 labs reviewed; Rx approved through October

## 2016-09-27 ENCOUNTER — Ambulatory Visit: Payer: Self-pay | Admitting: Certified Nurse Midwife

## 2016-10-26 ENCOUNTER — Other Ambulatory Visit: Payer: Self-pay | Admitting: Family Medicine

## 2017-01-04 ENCOUNTER — Encounter: Payer: Self-pay | Admitting: Family Medicine

## 2017-01-04 ENCOUNTER — Ambulatory Visit (INDEPENDENT_AMBULATORY_CARE_PROVIDER_SITE_OTHER): Payer: 59 | Admitting: Family Medicine

## 2017-01-04 VITALS — BP 140/82 | HR 104 | Temp 98.2°F | Resp 18 | Ht 65.0 in | Wt 239.2 lb

## 2017-01-04 DIAGNOSIS — M791 Myalgia, unspecified site: Secondary | ICD-10-CM

## 2017-01-04 DIAGNOSIS — L03312 Cellulitis of back [any part except buttock]: Secondary | ICD-10-CM

## 2017-01-04 MED ORDER — CLINDAMYCIN HCL 300 MG PO CAPS: 300.0000 mg | ORAL_CAPSULE | Freq: Four times a day (QID) | ORAL | 0 refills | Status: AC

## 2017-01-04 NOTE — Patient Instructions (Addendum)

## 2017-01-04 NOTE — Progress Notes (Addendum)
Name: Melanie Villarreal   MRN: [id number]    DOB: 1976/05/16   Date:01/04/2017       Progress Note  Subjective  Chief Complaint  Chief Complaint  Patient presents with  . Insect Bite    patient presents with insect bite on her left side that her daughter noticed Tuesday afternoon.    HPI  PT presents with wound on LEFT mid back x4 days. She noticed wound after going to a client's home and seeing a large spider - unsure if she had any bite occur and no insect has been found.  Area has not been improving with antibiotic ointment applications.  No fevers/chills, night sweats, myalgias/arthralgias, or NVD.  Body Aches: Patient voices concern for "neuropathy" for the last few months. She describes muscle aches that worsens throughout the day in her BUE and BLE. Ibuprofen helps the pain significantly.  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  No swelling, no redness or heat.  Patient Active Problem List   Diagnosis Date Noted  . Elevated serum glutamic pyruvic transaminase (SGPT) level 07/24/2016  . Obesity 03/23/2016  . Tachycardia 02/24/2016  . Valvular regurgitation 01/11/2016  . Medication monitoring encounter 01/03/2016  . Panic disorder with agoraphobia and moderate panic attacks 12/30/2015  . Chronic prescription benzodiazepine use 12/30/2015  . ADHD (attention deficit hyperactivity disorder), combined type 08/30/2015  . Numbness of toes 08/30/2015  . Cough due to ACE inhibitor 03/14/2015  . Generalized anxiety disorder 12/31/2014  . Back ache 10/25/2014  . Major depressive disorder, recurrent, in full remission with anxious distress ([place]) 10/25/2014  . Diabetes mellitus type 2, controlled, without complications ([place]) 88/41/6606  . HLD (hyperlipidemia) 10/25/2014  . Hypertension goal BP (blood pressure) < 130/80 10/25/2014  . Low back pain 10/16/2014    Social History  Substance Use Topics  . Smoking status: Former Smoker    Types: Cigarettes     Quit date: 11/28/2015  . Smokeless tobacco: Never Used  . Alcohol use 0.0 oz/week     Current Outpatient Prescriptions:  .  aspirin EC 81 MG tablet, Take 1 tablet (81 mg total) by mouth daily., Disp: , Rfl:  .  atorvastatin (LIPITOR) 20 MG tablet, take 1 tablet by mouth at bedtime, Disp: 30 tablet, Rfl: 7 .  LORazepam (ATIVAN) 1 MG tablet, take 1/2 or 1 WHOLE tablet by mouth at bedtime if needed, Disp: , Rfl: 0 .  metFORMIN (GLUCOPHAGE) 1000 MG tablet, Take 1 tablet (1,000 mg total) by mouth 2 (two) times daily with a meal., Disp: 60 tablet, Rfl: 5 .  sertraline (ZOLOFT) 100 MG tablet, Take 1.5 tablets (150 mg total) by mouth daily., Disp: 45 tablet, Rfl: 6 .  valsartan (DIOVAN) 80 MG tablet, take 1 tablet by mouth once daily, Disp: 30 tablet, Rfl: 5 .  VYVANSE 50 MG capsule, Take 50 mg by mouth daily., Disp: , Rfl: 0  Allergies  Allergen Reactions  . Penicillins Anaphylaxis    ROS  Constitutional: Negative for fever or weight change.  Respiratory: Negative for cough and shortness of breath.   Cardiovascular: Negative for chest pain or palpitations.  Gastrointestinal: Negative for abdominal pain, no bowel changes.  Musculoskeletal: Negative for gait problem or joint swelling. See HPI Skin: Negative for rash. See HPI Neurological: Negative for dizziness or headache.  No other specific complaints in a complete review of systems (except as listed in HPI above).  Objective  Vitals:   01/04/17 1000  BP: 140/82  Pulse: (!) 104  Resp: 18  Temp: 98.2 F (36.8 C)  TempSrc: Oral  SpO2: 99%  Weight: 239 lb 3.2 oz (108.5 kg)  Height: 5\' 5"  (1.651 m)    Body mass index is 39.8 kg/m.  Nursing Note and Vital Signs reviewed.  Physical Exam  Skin:       Constitutional: Patient appears well-developed and well-nourished. Obese No distress.  HEENT: head atraumatic, normocephalic Cardiovascular: Normal rate, regular rhythm, S1/S2 present.  No murmur or rub heard. No BLE  edema. Pulmonary/Chest: Effort normal and breath sounds clear. No respiratory distress or retractions. Abdominal: Soft and non-tender, bowel sounds present x4 quadrants. Psychiatric: Patient has a normal mood and affect. behavior is normal. Judgment and thought content normal. Skin: 1cm diatmeter circular raised yellow granulation with surrounding erythema. Nontender to palpation, no red streaking, no fluctuance to left mid-back Musculoskeletal: Normal range of motion, no joint effusions. No gross deformities Psychiatric: Patient has a normal mood and affect. behavior is normal. Judgment and thought content normal.  No results found for this or any previous visit (from the past 2160 hour(s)).   Assessment & Plan  1. Cellulitis of skin of back - clindamycin (CLEOCIN) 300 MG capsule; Take 1 capsule (300 mg total) by mouth 4 (four) times daily.  Dispense: 28 capsule; Refill: 0 - Patient has anaphylaxis allergy to PCN  2. Myalgia -Discussed this issue at length, patient does not want to do any physical therapy or see a specialist at this time. She would like to continue PRN Ibuprofen and will follow up if she changes her mind. - Encouraged good ergonomics and increase in exercise - Weight loss also discussed as a way to provide less strain on joints and muscles  -Red flags and when to present for emergency care or RTC including fever >101.78F, chest pain, shortness of breath, new/worsening/un-resolving symptoms, red streaking, increased pain, increase in fatigue reviewed with patient at time of visit. Follow up and care instructions discussed and provided in AVS.  I have reviewed this encounter including the documentation in this note and/or discussed this patient with the Johney Maine, FNP, NP-C. I am certifying that I agree with the content of this note as supervising physician.  Steele Sizer, MD [facility] 01/04/2017, 5:37 PM

## 2017-01-04 NOTE — Addendum Note (Signed)
Addended by: Hubbard Hartshorn on: 01/04/2017 03:47 PM   Modules accepted: Level of Service

## 2017-01-21 ENCOUNTER — Ambulatory Visit: Payer: Commercial Managed Care - HMO | Admitting: Family Medicine

## 2017-01-24 ENCOUNTER — Ambulatory Visit: Payer: 59 | Admitting: Family Medicine

## 2017-02-01 ENCOUNTER — Other Ambulatory Visit: Payer: Self-pay | Admitting: Family Medicine

## 2017-02-01 ENCOUNTER — Encounter: Payer: Self-pay | Admitting: Family Medicine

## 2017-02-01 ENCOUNTER — Ambulatory Visit (INDEPENDENT_AMBULATORY_CARE_PROVIDER_SITE_OTHER): Payer: 59 | Admitting: Family Medicine

## 2017-02-01 DIAGNOSIS — F902 Attention-deficit hyperactivity disorder, combined type: Secondary | ICD-10-CM

## 2017-02-01 DIAGNOSIS — Z5181 Encounter for therapeutic drug level monitoring: Secondary | ICD-10-CM

## 2017-02-01 DIAGNOSIS — R7401 Elevation of levels of liver transaminase levels: Secondary | ICD-10-CM

## 2017-02-01 DIAGNOSIS — F411 Generalized anxiety disorder: Secondary | ICD-10-CM

## 2017-02-01 DIAGNOSIS — T464X5A Adverse effect of angiotensin-converting-enzyme inhibitors, initial encounter: Secondary | ICD-10-CM

## 2017-02-01 DIAGNOSIS — R058 Other specified cough: Secondary | ICD-10-CM

## 2017-02-01 DIAGNOSIS — I38 Endocarditis, valve unspecified: Secondary | ICD-10-CM | POA: Diagnosis not present

## 2017-02-01 DIAGNOSIS — E119 Type 2 diabetes mellitus without complications: Secondary | ICD-10-CM | POA: Diagnosis not present

## 2017-02-01 DIAGNOSIS — R740 Nonspecific elevation of levels of transaminase and lactic acid dehydrogenase [LDH]: Secondary | ICD-10-CM

## 2017-02-01 DIAGNOSIS — E782 Mixed hyperlipidemia: Secondary | ICD-10-CM

## 2017-02-01 DIAGNOSIS — I517 Cardiomegaly: Secondary | ICD-10-CM | POA: Diagnosis not present

## 2017-02-01 DIAGNOSIS — I10 Essential (primary) hypertension: Secondary | ICD-10-CM

## 2017-02-01 DIAGNOSIS — R05 Cough: Secondary | ICD-10-CM | POA: Diagnosis not present

## 2017-02-01 DIAGNOSIS — F330 Major depressive disorder, recurrent, mild: Secondary | ICD-10-CM

## 2017-02-01 DIAGNOSIS — Z79899 Other long term (current) drug therapy: Secondary | ICD-10-CM | POA: Diagnosis not present

## 2017-02-01 HISTORY — DX: Cardiomegaly: I51.7

## 2017-02-01 MED ORDER — OLMESARTAN MEDOXOMIL 5 MG PO TABS: 5.0000 mg | ORAL_TABLET | Freq: Every day | ORAL | 1 refills | Status: DC

## 2017-02-01 NOTE — Assessment & Plan Note (Signed)
Mild; control BP and work on weight loss

## 2017-02-01 NOTE — Assessment & Plan Note (Signed)
Check fasting labs in August; limit saturated fats, doing [place] over [place], making progress

## 2017-02-01 NOTE — Assessment & Plan Note (Signed)
Avoiding ACE-I; using only ARB for renal protection

## 2017-02-01 NOTE — Patient Instructions (Signed)
Return in August for fasting labs please Try to limit saturated fats in your diet (bologna, hot dogs, barbeque, cheeseburgers, hamburgers, steak, bacon, sausage, cheese, etc.) and get more fresh fruits, vegetables, and whole grains Check out the information at familydoctor.org entitled "Nutrition for Weight Loss: What You Need to Know about Fad Diets" Try to lose between 1-2 pounds per week by taking in fewer calories and burning off more calories You can succeed by limiting portions, limiting foods dense in calories and fat, becoming more active, and drinking 8 glasses of water a day (64 ounces) Don't skip meals, especially breakfast, as skipping meals may alter your metabolism Do not use over-the-counter weight loss pills or gimmicks that claim rapid weight loss A healthy BMI (or body mass index) is between 18.5 and 24.9 You can calculate your ideal BMI at the [place] website [employer]Mon[employer]fr Your goal blood pressure is less than 130 mmHg on top. Try to follow the DASH guidelines (DASH stands for Dietary Approaches to Stop Hypertension) Try to limit the sodium in your diet.  Ideally, consume less than 1.5 grams (less than 1,500mg ) per day. Do not add salt when cooking or at the table.  Check the sodium amount on labels when shopping, and choose items lower in sodium when given a choice. Avoid or limit foods that already contain a lot of sodium. Eat a diet rich in fruits and vegetables and whole grains.

## 2017-02-01 NOTE — Assessment & Plan Note (Signed)
Very very mild, likely fatty liver; should improve with weight loss

## 2017-02-01 NOTE — Assessment & Plan Note (Signed)
Mild regurg; patient encouraged to lose weight

## 2017-02-01 NOTE — Assessment & Plan Note (Signed)
Check SGPT on statin, check Cr and K+ on ARB; check Cr on metformin

## 2017-02-01 NOTE — Assessment & Plan Note (Signed)
Foot xam by MD today; check A1c in August

## 2017-02-01 NOTE — Assessment & Plan Note (Addendum)
Encouragement given to lose weight which will help other medical conditions; explained BMI > 35 plus diabetes equals morbid obesity; she did not want me to change the diagnosis code until after she left

## 2017-02-01 NOTE — Progress Notes (Signed)
BP 124/78   Pulse 73   Temp 98.3 F (36.8 C) (Oral)   Resp 14   Wt 239 lb (108.4 kg)   LMP 01/09/2017   SpO2 98%   BMI 39.77 kg/m    Subjective:    Patient ID: Melanie Villarreal, female    DOB: 11-Sep-1975, 41 y.o.   MRN: [id number]  HPI: Melanie Villarreal is a 41 y.o. female  Chief Complaint  Patient presents with  . Follow-up    HPI Spider bite last month, maybe brown recluse; took antibiotics but essential oils tipped the scales in her favor and cleared it right up; no fevers  High BP; she is walking and avoiding sodas and not adding salt; drinking 70 ounces of water a day; mother has CV disease, father not taking anything for BP; she is on valsartan which has been recalled  Type 2 diabetes; she is checking sugars; 91 fasting this morning; last night it was 101 two hours after eating; highest in last 2 weeks, 134 after meal No lows; using essential oils; hopes to be able to decrease and/or stop medicines in the next several months, before next visit  High cholesterol; doing [place] over [place], cutting out processed foods and down on red meat  Obesity; trying to eat better; hopes to lose enough weight in the coming months to come off her medicines  Depression screen [facility] 2/9 02/01/2017 01/04/2017 07/24/2016 03/23/2016 12/30/2015  Decreased Interest 0 0 0 0 0  Down, Depressed, Hopeless 0 0 0 1 0  PHQ - 2 Score 0 0 0 1 0   Relevant past medical, surgical, family and social history reviewed Past Medical History:  Diagnosis Date  . ADHD (attention deficit hyperactivity disorder), combined type   . Allergy   . Anxiety   . Depression, controlled   . Diabetes mellitus without complication ([place])   . Generalized anxiety disorder 12/31/2014  . HTN, goal below 140/90   . Hyperlipidemia   . Hypertension goal BP (blood pressure) < 130/80 10/25/2014  . LVH (left ventricular hypertrophy) 02/01/2017   No CHF, on echo 2016, Dr. Clayborn Bigness  . Morbid obesity ([place])   . Panic disorder with  agoraphobia and moderate panic attacks 12/30/2015   Past Surgical History:  Procedure Laterality Date  . CESAREAN SECTION    . TUBAL LIGATION     Family History  Problem Relation Age of Onset  . Heart disease Mother   . Hyperlipidemia Mother   . Hypertension Mother   . Miscarriages / Stillbirths Mother        2  . Anxiety disorder Mother   . Alcohol abuse Father   . Cancer Father        testicular  . Depression Father   . Heart disease Sister   . Hyperlipidemia Sister   . Hypertension Sister   . Drug abuse Brother   . Alcohol abuse Brother   . Depression Brother   . ADD / ADHD Son   . Mental illness Maternal Aunt   . Alcohol abuse Maternal Grandfather   . Depression Maternal Grandfather   . Anxiety disorder Maternal Grandfather   . Cancer Paternal Grandmother   . Alcohol abuse Paternal Grandfather    Social History   Social History  . Marital status: Married    Spouse name: N/A  . Number of children: N/A  . Years of education: N/A   Occupational History  . Not on file.   Social History Main Topics  .  Smoking status: Former Smoker    Types: Cigarettes    Quit date: 11/28/2015  . Smokeless tobacco: Never Used  . Alcohol use 0.0 oz/week     Comment: rare  . Drug use: No  . Sexual activity: Yes    Partners: Male   Other Topics Concern  . Not on file   Social History Narrative  . No narrative on file    Interim medical history since last visit reviewed. Allergies and medications reviewed  Review of Systems Per HPI unless specifically indicated above     Objective:    BP 124/78   Pulse 73   Temp 98.3 F (36.8 C) (Oral)   Resp 14   Wt 239 lb (108.4 kg)   LMP 01/09/2017   SpO2 98%   BMI 39.77 kg/m   Wt Readings from Last 3 Encounters:  02/01/17 239 lb (108.4 kg)  01/04/17 239 lb 3.2 oz (108.5 kg)  07/24/16 238 lb (108 kg)    Physical Exam  Constitutional: She appears well-developed and well-nourished. No distress.  Obese, weight stable    HENT:  Head: Normocephalic and atraumatic.  Eyes: EOM are normal. No scleral icterus.  Neck: No thyromegaly present.  Cardiovascular: Normal rate, regular rhythm and normal heart sounds.   Pulmonary/Chest: Effort normal and breath sounds normal.  Abdominal: Soft. Bowel sounds are normal. She exhibits no distension.  Musculoskeletal: She exhibits no edema.  Neurological: She is alert.  Skin: Skin is warm and dry. She is not diaphoretic. No pallor.  Psychiatric: She has a normal mood and affect. Her behavior is normal. Judgment and thought content normal.   Diabetic Foot Form - Detailed   Diabetic Foot Exam - detailed Diabetic Foot exam was performed with the following findings:  Yes 02/01/2017  2:09 PM  Visual Foot Exam completed.:  Yes  Pulse Foot Exam completed.:  Yes  Right Dorsalis Pedis:  Present Left Dorsalis Pedis:  Present  Sensory Foot Exam Completed.:  Yes Semmes-Weinstein Monofilament Test R Site 1-Great Toe:  Pos L Site 1-Great Toe:  Pos        Results for orders placed or performed in visit on 08/28/16  COMPLETE METABOLIC PANEL WITH GFR  Result Value Ref Range   Sodium 138 135 - 146 mmol/L   Potassium 4.7 3.5 - 5.3 mmol/L   Chloride 102 98 - 110 mmol/L   CO2 26 20 - 31 mmol/L   Glucose, Bld 131 (H) 65 - 99 mg/dL   BUN 12 7 - 25 mg/dL   Creat 0.74 0.50 - 1.10 mg/dL   Total Bilirubin 1.1 0.2 - 1.2 mg/dL   Alkaline Phosphatase 72 33 - 115 U/L   AST 22 10 - 30 U/L   ALT 33 (H) 6 - 29 U/L   Total Protein 7.2 6.1 - 8.1 g/dL   Albumin 4.6 3.6 - 5.1 g/dL   Calcium 9.4 8.6 - 10.2 mg/dL   GFR, Est African American >89 >=60 mL/min   GFR, Est Non African American >89 >=60 mL/min  Lipid panel  Result Value Ref Range   Cholesterol 123 <200 mg/dL   Triglycerides 197 (H) <150 mg/dL   HDL 33 (L) >50 mg/dL   Total CHOL/HDL Ratio 3.7 <5.0 Ratio   VLDL 39 (H) <30 mg/dL   LDL Cholesterol 51 <100 mg/dL  Hemoglobin A1c  Result Value Ref Range   Hgb A1c MFr Bld 6.5 (H) <5.7  %   Mean Plasma Glucose 140 mg/dL  Assessment & Plan:   Problem List Items Addressed This Visit      Cardiovascular and Mediastinum   LVH (left ventricular hypertrophy)    Mild; control BP and work on weight loss      Relevant Medications   olmesartan (BENICAR) 5 MG tablet   Hypertension goal BP (blood pressure) < 130/80 (Chronic)    Well-controlled today; limiting salt      Relevant Medications   olmesartan (BENICAR) 5 MG tablet     Endocrine   Diabetes mellitus type 2, controlled, without complications (HCC) (Chronic)    Foot xam by MD today; check A1c in August      Relevant Medications   olmesartan (BENICAR) 5 MG tablet     Other   Valvular regurgitation    Mild regurg; patient encouraged to lose weight      Morbid obesity ([place])    Encouragement given to lose weight which will help other medical conditions; explained BMI > 35 plus diabetes equals morbid obesity; she did not want me to change the diagnosis code until after she left      Medication monitoring encounter    Check SGPT on statin, check Cr and K+ on ARB; check Cr on metformin      Major depressive disorder, recurrent episode ([place])    Still on medicine, managed by psychiatrist      Relevant Medications   LORazepam (ATIVAN) 0.5 MG tablet   HLD (hyperlipidemia) (Chronic)    Check fasting labs in August; limit saturated fats, doing [place] over [employer], making progress      Relevant Medications   olmesartan (BENICAR) 5 MG tablet   Generalized anxiety disorder    Managed by psychiatrist      Elevated serum glutamic pyruvic transaminase (SGPT) level    Very very mild, likely fatty liver; should improve with weight loss      Cough due to ACE inhibitor    Avoiding ACE-I; using only ARB for renal protection      Chronic prescription benzodiazepine use    Very happy to hear that patient is weaning down; managed by psych      ADHD (attention deficit hyperactivity disorder), combined type     Managed by psychiatrist         We reviewed problem list, updated and went over every item  Follow up plan: Return in about 4 weeks (around 02/26/2017) for fasting labs only; 6 months AFTER August 14th with me.  An after-visit summary was printed and given to the patient at [place].  Please see the patient instructions which may contain other information and recommendations beyond what is mentioned above in the assessment and plan.  Meds ordered this encounter  Medications  . LORazepam (ATIVAN) 0.5 MG tablet    Sig: Take 0.5 mg by mouth daily.    Refill:  0  . olmesartan (BENICAR) 5 MG tablet    Sig: Take 1 tablet (5 mg total) by mouth daily. This replaces the valsartan    Dispense:  30 tablet    Refill:  1    No orders of the defined types were placed in this encounter.

## 2017-02-01 NOTE — Assessment & Plan Note (Signed)
Well-controlled today; limiting salt

## 2017-02-01 NOTE — Assessment & Plan Note (Signed)
Managed by psychiatrist

## 2017-02-01 NOTE — Assessment & Plan Note (Signed)
Very happy to hear that patient is weaning down; managed by psych

## 2017-02-01 NOTE — Assessment & Plan Note (Signed)
Still on medicine, managed by psychiatrist

## 2017-03-15 ENCOUNTER — Other Ambulatory Visit: Payer: Self-pay | Admitting: Family Medicine

## 2017-03-15 DIAGNOSIS — E119 Type 2 diabetes mellitus without complications: Secondary | ICD-10-CM

## 2017-03-15 DIAGNOSIS — Z5181 Encounter for therapeutic drug level monitoring: Secondary | ICD-10-CM

## 2017-03-15 DIAGNOSIS — E782 Mixed hyperlipidemia: Secondary | ICD-10-CM

## 2017-03-15 NOTE — Progress Notes (Signed)
Please ask patient to come in fasting for labs in the next few weeks; orders already entered Thank you

## 2017-03-20 ENCOUNTER — Telehealth: Payer: Self-pay

## 2017-03-20 NOTE — Telephone Encounter (Signed)
Dr. lada asked pt to come in for fasting l;abs in the next few weeks. Left a detail message leaving the times of our labs and instructions for the pt.

## 2017-03-29 ENCOUNTER — Other Ambulatory Visit: Payer: Self-pay | Admitting: Family Medicine

## 2017-04-27 ENCOUNTER — Other Ambulatory Visit: Payer: Self-pay | Admitting: Family Medicine

## 2017-04-29 ENCOUNTER — Other Ambulatory Visit: Payer: Self-pay

## 2017-04-29 DIAGNOSIS — E782 Mixed hyperlipidemia: Secondary | ICD-10-CM

## 2017-04-29 DIAGNOSIS — E119 Type 2 diabetes mellitus without complications: Secondary | ICD-10-CM

## 2017-04-29 DIAGNOSIS — Z5181 Encounter for therapeutic drug level monitoring: Secondary | ICD-10-CM

## 2017-04-29 NOTE — Telephone Encounter (Signed)
Please remind patient that we'd like to get fasting labs I'll send one refill of statin Thank you

## 2017-04-29 NOTE — Telephone Encounter (Signed)
Left detailed voicemail

## 2017-05-04 LAB — COMPLETE METABOLIC PANEL WITH GFR
AG RATIO: 1.7 (calc) (ref 1.0–2.5)
ALBUMIN MSPROF: 4.3 g/dL (ref 3.6–5.1)
ALT: 35 U/L — ABNORMAL HIGH (ref 6–29)
AST: 20 U/L (ref 10–30)
Alkaline phosphatase (APISO): 65 U/L (ref 33–115)
BUN: 14 mg/dL (ref 7–25)
CO2: 26 mmol/L (ref 20–32)
CREATININE: 0.63 mg/dL (ref 0.50–1.10)
Calcium: 8.9 mg/dL (ref 8.6–10.2)
Chloride: 103 mmol/L (ref 98–110)
GFR, Est African American: 131 mL/min/{1.73_m2} (ref >=60)
GFR, Est Non African American: 113 mL/min/{1.73_m2} (ref >=60)
GLOBULIN: 2.5 g/dL (ref 1.9–3.7)
Glucose, Bld: 122 mg/dL — ABNORMAL HIGH (ref 65–99)
POTASSIUM: 4.3 mmol/L (ref 3.5–5.3)
SODIUM: 138 mmol/L (ref 135–146)
Total Bilirubin: 0.8 mg/dL (ref 0.2–1.2)
Total Protein: 6.8 g/dL (ref 6.1–8.1)

## 2017-05-04 LAB — LIPID PANEL
CHOL/HDL RATIO: 3.7 (calc) (ref <5.0)
CHOLESTEROL: 121 mg/dL (ref <200)
HDL: 33 mg/dL — AB (ref >50)
LDL CHOLESTEROL (CALC): 64 mg/dL
Non-HDL Cholesterol (Calc): 88 mg/dL (calc) (ref <130)
TRIGLYCERIDES: 161 mg/dL — AB (ref <150)

## 2017-05-04 LAB — MICROALBUMIN / CREATININE URINE RATIO
CREATININE, URINE: 136 mg/dL (ref 20–275)
MICROALB UR: 0.8 mg/dL
Microalb Creat Ratio: 6 mcg/mg creat (ref <30)

## 2017-05-04 LAB — HEMOGLOBIN A1C
Hgb A1c MFr Bld: 6.6 % of total Hgb — ABNORMAL HIGH (ref <5.7)
MEAN PLASMA GLUCOSE: 143 (calc)
eAG (mmol/L): 7.9 (calc)

## 2017-05-06 ENCOUNTER — Telehealth: Payer: Self-pay

## 2017-05-06 NOTE — Telephone Encounter (Signed)
Called number listed in chart, pt's husband Erlene Quan) answers and states that he is at work right now and this is their only phone. He states that when he gets home he will inform pt that I called.

## 2017-05-06 NOTE — Telephone Encounter (Signed)
-----   Message from Arnetha Courser, MD sent at 05/06/2017  8:11 AM EDT ----- Please let pt know that she is not spilling excessive proteins through her kidneys (good news); her TG are better, down from 197 to 161; goal is less than 150; weight loss and healthy eating will help; HDL is too low; weight loss and exercise will help that; diabetes is under good control; thank you

## 2017-05-08 ENCOUNTER — Ambulatory Visit: Payer: Self-pay | Admitting: *Deleted

## 2017-05-08 NOTE — Telephone Encounter (Signed)
Patient is returning call regarding her lab results- they are not in the result box- so I took her number and let her know that we would call her back. She also request that we change her number in her chart to [phone]

## 2017-05-08 NOTE — Telephone Encounter (Signed)
Called pt no answer. Unable to leave message as no voicemail prompted (rang continuously)

## 2017-05-08 NOTE — Telephone Encounter (Signed)
CRM created for pt call back.

## 2017-05-10 ENCOUNTER — Telehealth: Payer: Self-pay

## 2017-05-10 NOTE — Telephone Encounter (Signed)
Called pt, see previous telephone encounter

## 2017-05-10 NOTE — Telephone Encounter (Signed)
Called pt no answer. LM for pt informing her of labs results (please see previous encounters and result note) Unable to route result note to clinical pool at [facility] due to result note already being closed.

## 2017-06-02 ENCOUNTER — Other Ambulatory Visit: Payer: Self-pay | Admitting: Family Medicine

## 2017-06-03 ENCOUNTER — Other Ambulatory Visit: Payer: Self-pay | Admitting: Family Medicine

## 2017-06-03 NOTE — Telephone Encounter (Signed)
Last K+ and Cr reviewed; Rx approved 

## 2017-07-06 ENCOUNTER — Other Ambulatory Visit: Payer: Self-pay | Admitting: Family Medicine

## 2017-08-01 ENCOUNTER — Other Ambulatory Visit: Payer: Self-pay | Admitting: Family Medicine

## 2017-08-01 DIAGNOSIS — E119 Type 2 diabetes mellitus without complications: Secondary | ICD-10-CM

## 2017-08-01 NOTE — Telephone Encounter (Signed)
appt tomorrow

## 2017-08-02 ENCOUNTER — Ambulatory Visit: Payer: 59 | Admitting: Family Medicine

## 2017-08-02 ENCOUNTER — Ambulatory Visit: Payer: Self-pay | Admitting: Family Medicine

## 2017-08-07 ENCOUNTER — Encounter: Payer: Self-pay | Admitting: Family Medicine

## 2017-08-07 ENCOUNTER — Ambulatory Visit: Payer: BLUE CROSS/BLUE SHIELD | Admitting: Family Medicine

## 2017-08-07 VITALS — BP 138/84 | HR 101 | Temp 98.3°F | Resp 14 | Wt 241.3 lb

## 2017-08-07 DIAGNOSIS — R7401 Elevation of levels of liver transaminase levels: Secondary | ICD-10-CM

## 2017-08-07 DIAGNOSIS — R000 Tachycardia, unspecified: Secondary | ICD-10-CM | POA: Diagnosis not present

## 2017-08-07 DIAGNOSIS — Z5181 Encounter for therapeutic drug level monitoring: Secondary | ICD-10-CM | POA: Diagnosis not present

## 2017-08-07 DIAGNOSIS — E010 Iodine-deficiency related diffuse (endemic) goiter: Secondary | ICD-10-CM

## 2017-08-07 DIAGNOSIS — I517 Cardiomegaly: Secondary | ICD-10-CM | POA: Diagnosis not present

## 2017-08-07 DIAGNOSIS — R740 Nonspecific elevation of levels of transaminase and lactic acid dehydrogenase [LDH]: Secondary | ICD-10-CM | POA: Diagnosis not present

## 2017-08-07 DIAGNOSIS — E782 Mixed hyperlipidemia: Secondary | ICD-10-CM

## 2017-08-07 DIAGNOSIS — E119 Type 2 diabetes mellitus without complications: Secondary | ICD-10-CM

## 2017-08-07 DIAGNOSIS — F330 Major depressive disorder, recurrent, mild: Secondary | ICD-10-CM

## 2017-08-07 NOTE — Assessment & Plan Note (Signed)
Seeing Dr. Clayborn Bigness; encouraged her to see her a cardiologist about her tachycardia and f/u of LVH

## 2017-08-07 NOTE — Assessment & Plan Note (Signed)
Check lipids 

## 2017-08-07 NOTE — Patient Instructions (Addendum)
Check out the information at familydoctor.org entitled "Nutrition for Weight Loss: What You Need to Know about Fad Diets" Try to lose between 1-2 pounds per week by taking in fewer calories and burning off more calories You can succeed by limiting portions, limiting foods dense in calories and fat, becoming more active, and drinking 8 glasses of water a day (64 ounces) Don't skip meals, especially breakfast, as skipping meals may alter your metabolism Do not use over-the-counter weight loss pills or gimmicks that claim rapid weight loss A healthy BMI (or body mass index) is between 18.5 and 24.9 You can calculate your ideal BMI at the [place] website ClubMonetize.fr Try to follow the DASH guidelines (DASH stands for Dietary Approaches to Stop Hypertension). Try to limit the sodium in your diet to no more than 1,500mg  of sodium per day. Certainly try to not exceed 2,000 mg per day at the very most. Do not add salt when cooking or at the table.  Check the sodium amount on labels when shopping, and choose items lower in sodium when given a choice. Avoid or limit foods that already contain a lot of sodium. Eat a diet rich in fruits and vegetables and whole grains, and try to lose weight if overweight or obese  If you have not heard anything from my staff in a week about any orders/referrals/studies from today, please contact us here to follow-up [phone]

## 2017-08-07 NOTE — Progress Notes (Signed)
BP 138/84   Pulse (!) 101   Temp 98.3 F (36.8 C) (Oral)   Resp 14   Wt 241 lb 4.8 oz (109.5 kg)   LMP 07/24/2017   SpO2 99%   BMI 40.15 kg/m    Subjective:    Patient ID: Melanie Villarreal, female    DOB: [birth year], 42 y.o.   MRN: [id number]  HPI: Melanie Villarreal is a 42 y.o. female  Chief Complaint  Patient presents with  . Follow-up    HPI Patient is here for follow-up; she would like to switch providers to Raelyn Ensign, NP which is fine with me and patient and I had a good, open discussion; okay to see provider of choice absolutely  She has high cholesterol; on statin Lab Results  Component Value Date   CHOL 145 08/07/2017   HDL 38 (L) 08/07/2017   LDLCALC 51 08/28/2016   TRIG 231 (H) 08/07/2017   CHOLHDL 3.8 08/07/2017   She has type 2 diabetes; checks FSBS periodically; sweet tea She is interested in losing weight; not using a GLP-1 Never had thyroid cancer; never heard of MEN-2 Lab Results  Component Value Date   HGBA1C 6.8 (H) 08/07/2017   High blood pressure; on 5 mg of benicar; BP goes up here at the doctor she says  On vyvanse and sertraline and ativan per psych; sertraline works, but she says it is hard to lose weight on sertraline; it's the best med for her, though, so she sounds torn  She has a cardiologist; has episodes of tachycardia, not a big water drinker; makes herself drink water at night; she'd like to see a different cardiologist  Obesity; tried Weight Watchers; does terrible with the point system she says  She fell on her LEFT knee a while ago and has some numbness across the anterior aspect of her LEFT leg just below the site where she hit; she has some scar/scab there under the patella, just feels a little numb below it  Depression screen [facility] 2/9 08/07/2017 02/01/2017 01/04/2017 07/24/2016 03/23/2016  Decreased Interest 0 0 0 0 0  Down, Depressed, Hopeless 0 0 0 0 1  PHQ - 2 Score 0 0 0 0 1   Relevant past medical, surgical, family and  social history reviewed Past Medical History:  Diagnosis Date  . ADHD (attention deficit hyperactivity disorder), combined type   . Allergy   . Anxiety   . Depression, controlled   . Diabetes mellitus without complication ([name])   . Generalized anxiety disorder 12/31/2014  . HTN, goal below 140/90   . Hyperlipidemia   . Hypertension goal BP (blood pressure) < 130/80 10/25/2014  . LVH (left ventricular hypertrophy) 02/01/2017   No CHF, on echo 2016, Dr. Clayborn Bigness  . Morbid obesity ([place])   . Panic disorder with agoraphobia and moderate panic attacks 12/30/2015   Past Surgical History:  Procedure Laterality Date  . CESAREAN SECTION    . TUBAL LIGATION     Family History  Problem Relation Age of Onset  . Heart disease Mother   . Hyperlipidemia Mother   . Hypertension Mother   . Miscarriages / Stillbirths Mother        2  . Anxiety disorder Mother   . Alcohol abuse Father   . Cancer Father        testicular  . Depression Father   . Heart disease Sister   . Hyperlipidemia Sister   . Hypertension Sister   . Drug abuse  Brother   . Alcohol abuse Brother   . Depression Brother   . ADD / ADHD Son   . Mental illness Maternal Aunt   . Alcohol abuse Maternal Grandfather   . Depression Maternal Grandfather   . Anxiety disorder Maternal Grandfather   . Cancer Paternal Grandmother   . Alcohol abuse Paternal Grandfather    Social History   Tobacco Use  . Smoking status: Former Smoker    Types: Cigarettes    Last attempt to quit: 11/28/2015    Years since quitting: 1.7  . Smokeless tobacco: Never Used  Substance Use Topics  . Alcohol use: Yes    Alcohol/week: 0.0 oz    Comment: rare  . Drug use: No    Interim medical history since last visit reviewed. Allergies and medications reviewed  Review of Systems Per HPI unless specifically indicated above     Objective:    BP 138/84   Pulse (!) 101   Temp 98.3 F (36.8 C) (Oral)   Resp 14   Wt 241 lb 4.8 oz (109.5 kg)    LMP 07/24/2017   SpO2 99%   BMI 40.15 kg/m   Wt Readings from Last 3 Encounters:  08/07/17 241 lb 4.8 oz (109.5 kg)  02/01/17 239 lb (108.4 kg)  01/04/17 239 lb 3.2 oz (108.5 kg)    Physical Exam  Constitutional: She appears well-developed and well-nourished. No distress.  Obese, weight stable  HENT:  Head: Normocephalic and atraumatic.  Eyes: EOM are normal. No scleral icterus.  Neck: Thyromegaly (RIGHT side larger than left with some increased firmness to palpation relative to surrounding adipose tissue, nontender) present.  Cardiovascular: Normal rate, regular rhythm and normal heart sounds.  Pulmonary/Chest: Effort normal and breath sounds normal.  Abdominal: Soft. Bowel sounds are normal. She exhibits no distension.  Musculoskeletal: She exhibits no edema.  Neurological: She is alert.  Discrete area of diminished sensation to monofilament testing anterior LEFT leg below and just lateral to site of scab/scar on the interior patellar tendon  Skin: Skin is warm and dry. She is not diaphoretic. No pallor.  No overlying skin changes on the LEFT shin; symmetry left and right lower legs  Psychiatric: She has a normal mood and affect. Her behavior is normal. Judgment normal. Her mood appears not anxious. Her affect is not inappropriate. She does not exhibit a depressed mood.   Diabetic Foot Form - Detailed   Diabetic Foot Exam - detailed Diabetic Foot exam was performed with the following findings:  Yes 08/07/2017 10:28 AM  Visual Foot Exam completed.:  Yes  Pulse Foot Exam completed.:  Yes  Right Dorsalis Pedis:  Present Left Dorsalis Pedis:  Present  Sensory Foot Exam Completed.:  Yes Semmes-Weinstein Monofilament Test R Site 1-Great Toe:  Pos L Site 1-Great Toe:  Pos           Assessment & Plan:   Problem List Items Addressed This Visit      Cardiovascular and Mediastinum   LVH (left ventricular hypertrophy)    Seeing Dr. Clayborn Bigness; encouraged her to see her a cardiologist  about her tachycardia and f/u of LVH      Relevant Orders   Ambulatory referral to Cardiology     Endocrine   Diabetes mellitus type 2, controlled, without complications ([place]) (Chronic)    Foot exam by MD; discussed starting GLP-1 for weight loss      Relevant Orders   Hemoglobin A1c (Completed)     Other  Morbid obesity ([place])    Encouragement given; increase water intake; discussed starting a GLP-1; I'd like to see what her A1c is and get the results of the thyroid US before starting the medicine      Medication monitoring encounter    Check liver and kidneys      Relevant Orders   COMPLETE METABOLIC PANEL WITH GFR (Completed)   Major depressive disorder, recurrent episode ([place])    Treated by psych; she believes the sertraline interferes with ability to lose weight; she is welcome to discuss medicine adjustment with psychiatrist, though she has been happy with the efficacy of the medicine itself      HLD (hyperlipidemia) (Chronic)    Check lipids      Relevant Orders   Lipid panel (Completed)   Elevated serum glutamic pyruvic transaminase (SGPT) level    Check SGPT      Relevant Orders   COMPLETE METABOLIC PANEL WITH GFR (Completed)    Other Visit Diagnoses    Tachycardia    -  Primary   Relevant Orders   Ambulatory referral to Cardiology   CBC with Differential/Platelet (Completed)   TSH (Completed)   Thyromegaly       Relevant Orders   US THYROID       Follow up plan: Return in about 3 months (around 11/05/2017) for follow-up with provider of choice.  An after-visit summary was printed and given to the patient at [place].  Please see the patient instructions which may contain other information and recommendations beyond what is mentioned above in the assessment and plan.  No orders of the defined types were placed in this encounter.   Orders Placed This Encounter  Procedures  . US THYROID  . CBC with Differential/Platelet  . TSH  . COMPLETE  METABOLIC PANEL WITH GFR  . Hemoglobin A1c  . Lipid panel  . Ambulatory referral to Cardiology

## 2017-08-07 NOTE — Assessment & Plan Note (Signed)
Treated by psych; she believes the sertraline interferes with ability to lose weight; she is welcome to discuss medicine adjustment with psychiatrist, though she has been happy with the efficacy of the medicine itself

## 2017-08-07 NOTE — Assessment & Plan Note (Signed)
Foot exam by MD; discussed starting GLP-1 for weight loss

## 2017-08-07 NOTE — Assessment & Plan Note (Signed)
Check SGPT

## 2017-08-07 NOTE — Assessment & Plan Note (Signed)
Check liver and kidneys 

## 2017-08-07 NOTE — Assessment & Plan Note (Addendum)
Encouragement given; increase water intake; discussed starting a GLP-1; I'd like to see what her A1c is and get the results of the thyroid US before starting the medicine

## 2017-08-08 ENCOUNTER — Telehealth: Payer: Self-pay | Admitting: Family Medicine

## 2017-08-08 LAB — CBC WITH DIFFERENTIAL/PLATELET
BASOS PCT: 0.3 %
Basophils Absolute: 30 cells/uL (ref 0–200)
Eosinophils Absolute: 120 cells/uL (ref 15–500)
Eosinophils Relative: 1.2 %
HCT: 43.5 % (ref 35.0–45.0)
Hemoglobin: 14.7 g/dL (ref 11.7–15.5)
Lymphs Abs: 3570 cells/uL (ref 850–3900)
MCH: 28.2 pg (ref 27.0–33.0)
MCHC: 33.8 g/dL (ref 32.0–36.0)
MCV: 83.3 fL (ref 80.0–100.0)
MONOS PCT: 4.5 %
MPV: 10.7 fL (ref 7.5–12.5)
NEUTROS PCT: 58.3 %
Neutro Abs: 5830 cells/uL (ref 1500–7800)
PLATELETS: 389 10*3/uL (ref 140–400)
RBC: 5.22 10*6/uL — ABNORMAL HIGH (ref 3.80–5.10)
RDW: 12.8 % (ref 11.0–15.0)
TOTAL LYMPHOCYTE: 35.7 %
WBC: 10 10*3/uL (ref 3.8–10.8)
WBCMIX: 450 {cells}/uL (ref 200–950)

## 2017-08-08 LAB — COMPLETE METABOLIC PANEL WITH GFR
AG RATIO: 1.5 (calc) (ref 1.0–2.5)
ALKALINE PHOSPHATASE (APISO): 72 U/L (ref 33–115)
ALT: 37 U/L — AB (ref 6–29)
AST: 25 U/L (ref 10–30)
Albumin: 4.8 g/dL (ref 3.6–5.1)
BUN: 14 mg/dL (ref 7–25)
CALCIUM: 10 mg/dL (ref 8.6–10.2)
CHLORIDE: 102 mmol/L (ref 98–110)
CO2: 26 mmol/L (ref 20–32)
Creat: 0.75 mg/dL (ref 0.50–1.10)
GFR, Est African American: 116 mL/min/{1.73_m2} (ref >=60)
GFR, Est Non African American: 100 mL/min/{1.73_m2} (ref >=60)
GLOBULIN: 3.1 g/dL (ref 1.9–3.7)
Glucose, Bld: 150 mg/dL — ABNORMAL HIGH (ref 65–99)
POTASSIUM: 4.1 mmol/L (ref 3.5–5.3)
SODIUM: 139 mmol/L (ref 135–146)
Total Bilirubin: 1 mg/dL (ref 0.2–1.2)
Total Protein: 7.9 g/dL (ref 6.1–8.1)

## 2017-08-08 LAB — LIPID PANEL
CHOLESTEROL: 145 mg/dL (ref <200)
HDL: 38 mg/dL — AB (ref >50)
LDL Cholesterol (Calc): 75 mg/dL (calc)
Non-HDL Cholesterol (Calc): 107 mg/dL (calc) (ref <130)
TRIGLYCERIDES: 231 mg/dL — AB (ref <150)
Total CHOL/HDL Ratio: 3.8 (calc) (ref <5.0)

## 2017-08-08 LAB — HEMOGLOBIN A1C
Hgb A1c MFr Bld: 6.8 % of total Hgb — ABNORMAL HIGH (ref <5.7)
MEAN PLASMA GLUCOSE: 148 (calc)
eAG (mmol/L): 8.2 (calc)

## 2017-08-08 LAB — TSH: TSH: 2.27 m[IU]/L

## 2017-08-08 NOTE — Telephone Encounter (Signed)
I sent results to [facility] in lab results

## 2017-08-08 NOTE — Telephone Encounter (Signed)
Labs not signed off on by Doctor. DR. Sanda Klein please advise

## 2017-08-08 NOTE — Telephone Encounter (Signed)
Copied from [place] [id number]. Topic: Quick Communication - See Telephone Encounter >> Aug 08, 2017  2:04 PM Ivar Drape wrote: CRM for notification. See Telephone encounter for:  08/08/17. Patient would like the results of her recent labs. Please call [phone]

## 2017-08-09 ENCOUNTER — Other Ambulatory Visit: Payer: Self-pay

## 2017-08-09 DIAGNOSIS — R718 Other abnormality of red blood cells: Secondary | ICD-10-CM

## 2017-08-09 NOTE — Telephone Encounter (Signed)
Patient notified of lab results

## 2017-08-14 ENCOUNTER — Institutional Professional Consult (permissible substitution): Payer: Self-pay | Admitting: Internal Medicine

## 2017-08-14 ENCOUNTER — Ambulatory Visit
Admission: RE | Admit: 2017-08-14 | Discharge: 2017-08-14 | Disposition: A | Discharge status: Home / Self Care | Payer: BLUE CROSS/BLUE SHIELD | Source: Ambulatory Visit | Attending: Family Medicine | Admitting: Family Medicine

## 2017-08-14 DIAGNOSIS — E010 Iodine-deficiency related diffuse (endemic) goiter: Secondary | ICD-10-CM | POA: Diagnosis present

## 2017-08-15 ENCOUNTER — Encounter: Payer: Self-pay | Admitting: Family Medicine

## 2017-08-15 ENCOUNTER — Telehealth: Payer: Self-pay | Admitting: Family Medicine

## 2017-08-15 DIAGNOSIS — E041 Nontoxic single thyroid nodule: Secondary | ICD-10-CM

## 2017-08-15 HISTORY — DX: Nontoxic single thyroid nodule: E04.1

## 2017-08-15 NOTE — Assessment & Plan Note (Signed)
Refer for biopsy

## 2017-08-15 NOTE — Telephone Encounter (Signed)
I talked with patient, she was already aware of the large nodule as she was able to access her chart I explained this needs biopsy; will get her to ENT ASAP, first available New provider, Raquel Sarna, was made aware; I told patient that she will be here to help and I wish her the best No GLP-1 as we had considered, given her new dx; she understands

## 2017-08-15 NOTE — Telephone Encounter (Signed)
Thyroid nodule on [place], meets criteria for biopsy; will call patient and ask for preference for provider

## 2017-08-16 ENCOUNTER — Ambulatory Visit (INDEPENDENT_AMBULATORY_CARE_PROVIDER_SITE_OTHER): Payer: BLUE CROSS/BLUE SHIELD | Admitting: Internal Medicine

## 2017-08-16 ENCOUNTER — Encounter: Payer: Self-pay | Admitting: Internal Medicine

## 2017-08-16 VITALS — BP 142/98 | HR 113 | Resp 16 | Ht 65.0 in | Wt 241.0 lb

## 2017-08-16 DIAGNOSIS — G4719 Other hypersomnia: Secondary | ICD-10-CM

## 2017-08-16 DIAGNOSIS — F17210 Nicotine dependence, cigarettes, uncomplicated: Secondary | ICD-10-CM | POA: Diagnosis not present

## 2017-08-16 DIAGNOSIS — Z720 Tobacco use: Secondary | ICD-10-CM

## 2017-08-16 NOTE — Progress Notes (Signed)
[facility] Consultation      Assessment and Plan:  Excessive daytime sleepiness. -Symptoms and signs of obstructive sleep apnea. - We will send for sleep study, start CPAP as indicated.  Nicotine abuse. - Current vaping, last smoked cigarettes approximately 2 months ago.  She is currently cutting down on her nicotine dose. -Discussed trying to continue cut down on her vaping dose with a goal of stopping completely. - Spent greater than 3 minutes in discussion.  ADHD. -Currently on Vyvanse, which is helping her stay awake despite her symptoms of excessive daytime sleepiness.  Date: 08/16/2017  MRN# [id number] Melanie Villarreal Jul 11, 1976    Ernest Pine is a 42 y.o. old female seen in consultation for chief complaint of:    Chief Complaint  Patient presents with  . Advice Only    Referred by Raelyn Ensign for elevated RBC/Sleep eval.  . Snoring    pt also c/o excessive daytime sleepiness. Pt states spouse witnessed her stop breathing while asleep.    HPI:   Patient notes that she has difficulty sleeping through the night, with loud snoring.  She usually goes to bed between 9 and 11 PM.  She takes an hour or more to fall asleep, she wakes up around 7 AM. She went for her annual physical and was told that her blood counts were high. Her husband tells her that she snores a lot and stop breathing in her sleep. Her mother in law is a [employer] scientist and saw her sleeping one time, and fellt that she has OSA because she stopped breathing.  She is sleepy during the say, she takes naps during the day, she wakes feeling tired.  She takes vyvanse for ADHD which helps her stay awake.  Her father has OSA and wears a CPAP.  She sucks her thumb during sleep.  She has been found to have a anterior neck mass, and is awaiting a biopsy.  She stopped smoking 2 months ago, she is currently vaping, weaning down.    PMHX:   Past Medical History:  Diagnosis Date  . ADHD (attention  deficit hyperactivity disorder), combined type   . Allergy   . Anxiety   . Depression, controlled   . Diabetes mellitus without complication ([place])   . Generalized anxiety disorder 12/31/2014  . HTN, goal below 140/90   . Hyperlipidemia   . Hypertension goal BP (blood pressure) < 130/80 10/25/2014  . LVH (left ventricular hypertrophy) 02/01/2017   No CHF, on echo 2016, Dr. Clayborn Bigness  . Morbid obesity ([place])   . Panic disorder with agoraphobia and moderate panic attacks 12/30/2015  . Thyroid nodule 08/15/2017   Surgical Hx:  Past Surgical History:  Procedure Laterality Date  . CESAREAN SECTION    . TUBAL LIGATION     Family Hx:  Family History  Problem Relation Age of Onset  . Heart disease Mother   . Hyperlipidemia Mother   . Hypertension Mother   . Miscarriages / Stillbirths Mother        2  . Anxiety disorder Mother   . Alcohol abuse Father   . Cancer Father        testicular  . Depression Father   . Heart disease Sister   . Hyperlipidemia Sister   . Hypertension Sister   . Drug abuse Brother   . Alcohol abuse Brother   . Depression Brother   . ADD / ADHD Son   . Mental illness Maternal Aunt   .  Alcohol abuse Maternal Grandfather   . Depression Maternal Grandfather   . Anxiety disorder Maternal Grandfather   . Cancer Paternal Grandmother   . Alcohol abuse Paternal Grandfather    Social Hx:   Social History   Tobacco Use  . Smoking status: Former Smoker    Types: Cigarettes    Last attempt to quit: 11/28/2015    Years since quitting: 1.7  . Smokeless tobacco: Never Used  Substance Use Topics  . Alcohol use: Yes    Alcohol/week: 0.0 oz    Comment: rare  . Drug use: No   Medication:    Current Outpatient Medications:  .  aspirin EC 81 MG tablet, Take 1 tablet (81 mg total) by mouth daily., Disp: , Rfl:  .  atorvastatin (LIPITOR) 20 MG tablet, take 1 tablet by mouth at bedtime, Disp: 30 tablet, Rfl: 2 .  LORazepam (ATIVAN) 0.5 MG tablet, Take 1 mg by mouth  daily. , Disp: , Rfl: 0 .  metFORMIN (GLUCOPHAGE) 1000 MG tablet, take 1 tablet by mouth twice a day with food, Disp: 60 tablet, Rfl: 0 .  olmesartan (BENICAR) 5 MG tablet, take 1 tablet by mouth once daily, Disp: 30 tablet, Rfl: 2 .  sertraline (ZOLOFT) 100 MG tablet, Take 1.5 tablets (150 mg total) by mouth daily. (Patient taking differently: Take 200 mg by mouth daily. ), Disp: 45 tablet, Rfl: 6 .  VYVANSE 50 MG capsule, Take 50 mg by mouth daily., Disp: , Rfl: 0   Allergies:  Penicillins  Review of Systems: Gen:  Denies  fever, sweats, chills HEENT: Denies blurred vision, double vision. bleeds, sore throat Cvc:  No dizziness, chest pain. Resp:   Denies cough or sputum production, shortness of breath Gi: Denies swallowing difficulty, stomach pain. Gu:  Denies bladder incontinence, burning urine Ext:   No Joint pain, stiffness. Skin: No skin rash,  hives  Endoc:  No polyuria, polydipsia. Psych: No depression, insomnia. Other:  All other systems were reviewed with the patient and were negative other that what is mentioned in the HPI.   Physical Examination:   VS: BP (!) 142/98 (BP Location: Left Arm, Cuff Size: Large)   Pulse (!) 113   Resp 16   Ht 5\' 5"  (1.651 m)   Wt 241 lb (109.3 kg)   LMP 07/24/2017   SpO2 96%   BMI 40.10 kg/m   General Appearance: No distress  Neuro:without focal findings,  speech normal,  HEENT: PERRLA, EOM intact.  Lump in the lower right side of neck. Pulmonary: normal breath sounds, No wheezing.  CardiovascularNormal S1,S2.  No m/r/g.   Abdomen: Benign, Soft, non-tender. Renal:  No costovertebral tenderness  GU:  No performed at this time. Endoc: No evident thyromegaly, no signs of acromegaly. Skin:   warm, no rashes, no ecchymosis  Extremities: normal, no cyanosis, clubbing.  Other findings:    LABORATORY PANEL:   CBC No results for input(s): WBC, HGB, HCT, PLT in the last 168  hours. ------------------------------------------------------------------------------------------------------------------  Chemistries  No results for input(s): NA, K, CL, CO2, GLUCOSE, BUN, CREATININE, CALCIUM, MG, AST, ALT, ALKPHOS, BILITOT in the last 168 hours.  Invalid input(s): GFRCGP ------------------------------------------------------------------------------------------------------------------  Cardiac Enzymes No results for input(s): TROPONINI in the last 168 hours. ------------------------------------------------------------  RADIOLOGY:  US Thyroid  Result Date: 08/14/2017 CLINICAL DATA:  42 year old female with history of enlarged thyroid EXAM: THYROID ULTRASOUND TECHNIQUE: Ultrasound examination of the thyroid gland and adjacent soft tissues was performed. COMPARISON:  None. FINDINGS: Parenchymal Echotexture: Normal  Isthmus: 0.4 cm Right lobe: 36.1 cm x 2.9 cm x 3.5 cm Left lobe: 3.5 cm x 1.3 cm x 1.4 cm _________________________________________________________ Estimated total number of nodules >/= 1 cm: 1 Number of spongiform nodules >/=  2 cm not described below (TR1): 0 Number of mixed cystic and solid nodules >/= 1.5 cm not described below (TR2): 0 _________________________________________________________ Nodule # 1: Location: Right; Mid Maximum size: 5.0 cm; Other 2 dimensions: 3.8 cm x 3.0 cm Composition: solid/almost completely solid (2) Echogenicity: isoechoic (1) Shape: not taller-than-wide (0) Margins: smooth (0) Echogenic foci: none (0) ACR TI-RADS total points: 3. ACR TI-RADS risk category: TR3 (3 points). ACR TI-RADS recommendations: Nodule meets criteria for biopsy _________________________________________________________ No adenopathy IMPRESSION: Right mid thyroid nodule (labeled 1) meets criteria for biopsy, as designated by the newly established ACR TI-RADS criteria, and referral for biopsy is recommended. Recommendations follow those established by the new ACR TI-RADS  criteria (J Am Coll Radiol 6203;55:974-163). Electronically Signed   By: Corrie Mckusick D.O.   On: 08/14/2017 16:36       Thank  you for the consultation and for allowing [facility] to assist in the care of your patient. Our recommendations are noted above.  Please contact us if we can be of further service.   Marda Stalker, MD.  Board Certified in Internal Medicine, Pulmonary Medicine, [place], and Sleep Medicine.  [facility] Office Number: [phone]  Patricia Pesa, M.D.  Merton Border, M.D  08/16/2017

## 2017-08-16 NOTE — Patient Instructions (Signed)
--

## 2017-08-22 ENCOUNTER — Other Ambulatory Visit: Payer: Self-pay | Admitting: Otolaryngology

## 2017-08-22 DIAGNOSIS — E041 Nontoxic single thyroid nodule: Secondary | ICD-10-CM

## 2017-09-01 NOTE — Telephone Encounter (Signed)
Signing off on old MyChart note Message has been read by patient

## 2017-09-03 ENCOUNTER — Ambulatory Visit
Admission: RE | Admit: 2017-09-03 | Discharge: 2017-09-03 | Disposition: A | Discharge status: Home / Self Care | Payer: BLUE CROSS/BLUE SHIELD | Source: Ambulatory Visit | Attending: Otolaryngology | Admitting: Otolaryngology

## 2017-09-03 DIAGNOSIS — E041 Nontoxic single thyroid nodule: Secondary | ICD-10-CM | POA: Insufficient documentation

## 2017-09-03 NOTE — Procedures (Signed)
US guided FNA of right thyroid nodule.  Total of 7 FNAs obtained.  Minimal blood loss and no immediate complication.

## 2017-09-04 ENCOUNTER — Other Ambulatory Visit: Payer: Self-pay | Admitting: Family Medicine

## 2017-09-04 NOTE — Telephone Encounter (Signed)
Please call patient to see how she is doing - she just had her thyroid biopsy.  I have sent in a month supply of olmesartan for her blood pressure, however I would like to see her in the office in the next 3-4 weeks so that we can meet and change over care.  Please have her schedule this at her convenience. Thank you!

## 2017-09-05 ENCOUNTER — Encounter: Payer: Self-pay | Admitting: Family Medicine

## 2017-09-05 LAB — CYTOLOGY - NON PAP

## 2017-09-06 ENCOUNTER — Telehealth: Payer: Self-pay | Admitting: Emergency Medicine

## 2017-09-06 ENCOUNTER — Encounter: Payer: Self-pay | Admitting: Family Medicine

## 2017-09-06 NOTE — Telephone Encounter (Signed)
Copied from [place] [id number]. Topic: Quick Communication - Lab Results >> Sep 06, 2017  8:32 AM Robina Ade, Helene Kelp D wrote: Patient called and would like a call back about her biopsy she had three days ago. Please call patient back, thanks.

## 2017-09-06 NOTE — Telephone Encounter (Signed)
We are unable to discuss test results as we did not perform the biopsy.  She needs to have the physician who performed the biopsy to review the results with her.

## 2017-09-06 NOTE — Telephone Encounter (Signed)
Please call Melanie Villarreal regarding her US biopsy soon as you are free. Patient call

## 2017-09-09 ENCOUNTER — Encounter: Payer: Self-pay | Admitting: Family Medicine

## 2017-09-10 NOTE — Progress Notes (Signed)
New Outpatient Visit Date: 09/11/2017  Referring Provider: Arnetha Courser, MD [address] [place], [place] [address]  Chief Complaint: Tachycardia  HPI:  Melanie Villarreal is a 42 y.o. female who is being seen today for the evaluation of tachycardia at the request of Dr. Sanda Klein. She has a history of hypertension, hyperlipidemia, type 2 diabetes mellitus, obesity, and anxiety/depression. She was seen in 2016 by Dr. Clayborn Bigness [facility]) for evaluation of chest pain and shortness of breath. Subsequent nuclear stress test was normal. Echo showed mild LVH and mild MR, PR, and TR.  Over the last 2-3 years, Melanie Villarreal has noted that her heart rate is always mildly elevated, typically at her over 100 bpm. She sometimes feels dizzy and off-balance. She also has noted occasional lightheadedness. She has had 2 syncopal/near syncopal episodes in her life, one at age 42 and the other a few years ago. Both occurred while she was voiding or on her way to the bathroom. She sometimes feels as though her dizziness is exacerbated by her menstrual cycle.  Melanie Villarreal has noted occasional episodes of chest pain, which she describes as a pins and needle sensation in her chest. Discomfort usually last a few seconds to a few minutes and is not related to any particular activity. She denies shortness of breath, orthopnea, and PND. She is scheduled for a sleep study due to significant fatigue. She is tired all the time and has found it difficult to lose weight. She currently uses Vyvanse due to significant ADHD. However, she feels that her heart rate was elevated even before starting this medication one to 2 years ago.  Melanie Villarreal was recently diagnosed with a benign thyroid nodule, which is currently undergoing surveillance.  --------------------------------------------------------------------------------------------------  Cardiovascular History & Procedures: Cardiovascular  Problems:  Tachycardia  Risk Factors:  Hypertension, hyperlipidemia, diabetes, obesity, family history, and tobacco use  Cath/PCI:  None  CV Surgery:  None  EP Procedures and Devices:  None  Non-Invasive Evaluation(s):  TTE (05/31/15, [facility]): Normal LV size with mild LVH. LVEF >55%. Mild MR, PR, and TR. Normal RV size and function.  Exercise MPI (05/31/15): Normal myocardial perfusion. Patient exercised 9:00 minutes (10.4) without significant EKG changes.  Recent CV Pertinent Labs: Lab Results  Component Value Date   CHOL 145 08/07/2017   HDL 38 (L) 08/07/2017   LDLCALC 51 08/28/2016   TRIG 231 (H) 08/07/2017   CHOLHDL 3.8 08/07/2017   K 4.1 08/07/2017   K 3.7 08/18/2014   BUN 14 08/07/2017   BUN 11 08/18/2014   CREATININE 0.75 08/07/2017    --------------------------------------------------------------------------------------------------  Past Medical History:  Diagnosis Date  . ADHD (attention deficit hyperactivity disorder), combined type   . Allergy   . Anxiety   . Depression, controlled   . Diabetes mellitus without complication ([place])   . Generalized anxiety disorder 12/31/2014  . HTN, goal below 140/90   . Hyperlipidemia   . Hypertension goal BP (blood pressure) < 130/80 10/25/2014  . LVH (left ventricular hypertrophy) 02/01/2017   No CHF, on echo 2016, Dr. Clayborn Bigness  . Morbid obesity ([place])   . Panic disorder with agoraphobia and moderate panic attacks 12/30/2015  . Thyroid nodule 08/15/2017    Past Surgical History:  Procedure Laterality Date  . CESAREAN SECTION    . thyroid nodule biopsy    . TUBAL LIGATION      Current Meds  Medication Sig  . aspirin EC 81 MG tablet Take 1  tablet (81 mg total) by mouth daily.  Marland Kitchen atorvastatin (LIPITOR) 20 MG tablet take 1 tablet by mouth at bedtime  . LORazepam (ATIVAN) 0.5 MG tablet Take 1 mg by mouth daily.   . metFORMIN (GLUCOPHAGE) 1000 MG tablet take 1 tablet by mouth twice a day  with food  . olmesartan (BENICAR) 5 MG tablet TAKE 1 TABLET BY MOUTH ONCE DAILY  . sertraline (ZOLOFT) 100 MG tablet Take 1.5 tablets (150 mg total) by mouth daily. (Patient taking differently: Take 100 mg by mouth daily. )  . VYVANSE 50 MG capsule Take 50 mg by mouth daily.    Allergies: Penicillins and Latex  Social History   Socioeconomic History  . Marital status: Married    Spouse name: Not on file  . Number of children: Not on file  . Years of education: Not on file  . Highest education level: Not on file  Social Needs  . Financial resource strain: Not on file  . Food insecurity - worry: Not on file  . Food insecurity - inability: Not on file  . Transportation needs - medical: Not on file  . Transportation needs - non-medical: Not on file  Occupational History  . Not on file  Tobacco Use  . Smoking status: Former Smoker    Types: Cigarettes, E-cigarettes    Last attempt to quit: 11/28/2015    Years since quitting: 1.7  . Smokeless tobacco: Never Used  . Tobacco comment: On and off since 63 yrs of age; now using e-cigarettes  Substance and Sexual Activity  . Alcohol use: Yes    Alcohol/week: 0.0 oz    Frequency: Never    Comment: rare  . Drug use: No  . Sexual activity: Yes    Partners: Male  Other Topics Concern  . Not on file  Social History Narrative  . Not on file    Family History  Problem Relation Age of Onset  . Heart disease Mother   . Hyperlipidemia Mother   . Hypertension Mother   . Miscarriages / Stillbirths Mother        2  . Anxiety disorder Mother   . Coronary artery disease Mother 64       11 stents  . Alcohol abuse Father   . Cancer Father        testicular  . Depression Father   . Heart disease Sister   . Hyperlipidemia Sister   . Hypertension Sister   . Drug abuse Brother   . Alcohol abuse Brother   . Depression Brother   . ADD / ADHD Son   . Mental illness Maternal Aunt   . Alcohol abuse Maternal Grandfather   . Depression  Maternal Grandfather   . Anxiety disorder Maternal Grandfather   . Cancer Paternal Grandmother   . Alcohol abuse Paternal Grandfather     Review of Systems: A 12-system review of systems was performed and was negative except as noted in the HPI.  --------------------------------------------------------------------------------------------------  Physical Exam: BP 132/90 (BP Location: Right Arm, Patient Position: Sitting, Cuff Size: Large)   Pulse (!) 115   Ht 5\' 5"  (1.651 m)   Wt 244 lb 4 oz (110.8 kg)   BMI 40.65 kg/m   Repeat heart rate: 96 bpm  General:  Morbidly obese woman, seated in the exam room. HEENT: No conjunctival pallor or scleral icterus. Moist mucous membranes. OP clear. Neck: Supple without lymphadenopathy, thyromegaly, JVD, or HJR. No carotid bruit. Lungs: Normal work of breathing. Clear to  auscultation bilaterally without wheezes or crackles. Heart: Regular rate and rhythm without murmurs, rubs, or gallops. Non-displaced PMI. Abd: Bowel sounds present. Soft, NT/ND without hepatosplenomegaly Ext: No lower extremity edema. Radial, PT, and DP pulses are 2+ bilaterally Skin: Warm and dry without rash. Neuro: CNIII-XII intact. Strength and fine-touch sensation intact in upper and lower extremities bilaterally. Psych: Normal mood with animated affect. Speech is slightly pressured at times.  EKG:  Sinus tachycardia (heart rate 115 bpm). Otherwise, no significant abnormalities.  Lab Results  Component Value Date   WBC 10.0 08/07/2017   HGB 14.7 08/07/2017   HCT 43.5 08/07/2017   MCV 83.3 08/07/2017   PLT 389 08/07/2017    Lab Results  Component Value Date   NA 139 08/07/2017   K 4.1 08/07/2017   CL 102 08/07/2017   CO2 26 08/07/2017   BUN 14 08/07/2017   CREATININE 0.75 08/07/2017   GLUCOSE 150 (H) 08/07/2017   ALT 37 (H) 08/07/2017    Lab Results  Component Value Date   CHOL 145 08/07/2017   HDL 38 (L) 08/07/2017   LDLCALC 51 08/28/2016   TRIG 231  (H) 08/07/2017   CHOLHDL 3.8 08/07/2017   Lab Results  Component Value Date   TSH 2.27 08/07/2017    --------------------------------------------------------------------------------------------------  ASSESSMENT AND PLAN: Tachycardia This appears to be sinus tachycardia, which is likely secondary to several factors including obesity, deconditioning, and medications (particularly by Vyvanse). Though TSH was recently normal, thyroid dysfunction in the setting of thyroid nodules also a consideration. Given that tachycardia palpitations are present most days, we have agreed to obtain a 48-hour Holter monitor for further characterization, as well as a transthoracic echocardiogram. I have brought up the possibility of stopping Vyvanse, though Melanie Villarreal does not wish to do this as her ADHD has been well-controlled with the medication. We will start metoprolol tartrate 25 mg twice a day as well. I have also instructed Melanie Villarreal to limit her caffeine intake.  Near syncope Episodes most suggestive of vasovagal mechanism. Patient counseled to stay well-hydrated and to sit/lie down if she feels symptoms coming on.  Atypical chest pain Symptoms are atypical for CAD. Myocardial perfusion stress test in 2016 was reassuring.Marland Kitchen However, given multiple cardiac risk factors, we have agreed to proceed with further evaluation by coronary CTA and transthoracic echocardiogram. We will start metoprolol tartrate 25 mg twice a day.  Hypertension Blood pressure mildly elevated (goal less than 130/80). Continue current medications and add metoprolol tartrate 25 mg twice a day.  Hyperlipidemia Continue atorvastatin.  Follow-up: Return to clinic in 3 months.  Nelva Bush, MD 09/11/2017 2:26 PM

## 2017-09-11 ENCOUNTER — Ambulatory Visit: Payer: Self-pay | Admitting: Internal Medicine

## 2017-09-11 ENCOUNTER — Encounter: Payer: Self-pay | Admitting: Internal Medicine

## 2017-09-11 ENCOUNTER — Ambulatory Visit: Payer: BLUE CROSS/BLUE SHIELD | Admitting: Internal Medicine

## 2017-09-11 VITALS — BP 132/90 | HR 115 | Ht 65.0 in | Wt 244.2 lb

## 2017-09-11 DIAGNOSIS — R0789 Other chest pain: Secondary | ICD-10-CM | POA: Diagnosis not present

## 2017-09-11 DIAGNOSIS — I10 Essential (primary) hypertension: Secondary | ICD-10-CM

## 2017-09-11 DIAGNOSIS — E785 Hyperlipidemia, unspecified: Secondary | ICD-10-CM

## 2017-09-11 DIAGNOSIS — R000 Tachycardia, unspecified: Secondary | ICD-10-CM

## 2017-09-11 DIAGNOSIS — R55 Syncope and collapse: Secondary | ICD-10-CM | POA: Insufficient documentation

## 2017-09-11 DIAGNOSIS — Z8249 Family history of ischemic heart disease and other diseases of the circulatory system: Secondary | ICD-10-CM | POA: Diagnosis not present

## 2017-09-11 MED ORDER — METOPROLOL TARTRATE 25 MG PO TABS: 25.0000 mg | ORAL_TABLET | Freq: Two times a day (BID) | ORAL | 3 refills | Status: DC

## 2017-09-11 NOTE — Patient Instructions (Addendum)
Medication Instructions:  Your physician has recommended you make the following change in your medication:  1- START Metoprolol tartrate 25 mg (1 tablet) by mouth two times a day.   Labwork: Your physician recommends that you return for lab work in: [place]. (BMET) - Please go to the [facility]. You will check in at the front desk to the right as you walk into the atrium. Valet Parking is offered if needed.    Testing/Procedures: Your physician has requested that you have an echocardiogram. Echocardiography is a painless test that uses sound waves to create images of your heart. It provides your doctor with information about the size and shape of your heart and how well your heart's chambers and valves are working. This procedure takes approximately one hour. There are no restrictions for this procedure.  Your physician has recommended that you wear a 48 HOUR holter monitor. Holter monitors are medical devices that record the heart's electrical activity. Doctors most often use these monitors to diagnose arrhythmias. Arrhythmias are problems with the speed or rhythm of the heartbeat. The monitor is a small, portable device. You can wear one while you do your normal daily activities. This is usually used to diagnose what is causing palpitations/syncope (passing out).  Non-Cardiac CORONARY CT scanning, (CAT scanning), is a noninvasive, special x-ray that produces cross-sectional images of the body using x-rays and a computer. CT scans help physicians diagnose and treat medical conditions. For some CT exams, a contrast material is used to enhance visibility in the area of the body being studied. CT scans provide greater clarity and reveal more details than regular x-ray exams.   Please arrive at the [facility] main entrance of [facility] at xx:xx AM (30-45 minutes prior to test start time)  [facility] [address] [place], [place] [address] [phone]  Proceed to the [facility] Radiology Department (First Floor).  Please follow these instructions carefully (unless otherwise directed):   On the Night Before the Test: . Drink plenty of water. . Do not consume any caffeinated/decaffeinated beverages or chocolate 12 hours prior to your test. . Do not take any antihistamines 12 hours prior to your test. . If you take Metformin do not take 24 hours prior to test. . If the patient has contrast allergy: ? Patient will need a prescription for Prednisone and very clear instructions (as follows): 1. Prednisone 50 mg - take 13 hours prior to test 2. Take another Prednisone 50 mg 7 hours prior to test 3. Take another Prednisone 50 mg 1 hour prior to test 4. Take Benadryl 50 mg 1 hour prior to test . Patient must complete all four doses of above prophylactic medications. . Patient will need a ride after test due to Benadryl.  On the Day of the Test: . Drink plenty of water. Do not drink any water within one hour of the test. . Do not eat any food 4 hours prior to the test. . You may take your regular medications prior to the test. . IF NOT ON A BETA BLOCKER - Take 50 mg of lopressor (metoprolol) one hour before the test. . HOLD Furosemide morning of the test.  After the Test: . Drink plenty of water. . After receiving IV contrast, you may experience a mild flushed feeling. This is normal. . On occasion, you may experience a mild rash up to 24 hours after the test. This is not dangerous. If this  occurs, you can take Benadryl 25 mg and increase your fluid intake. . If you experience trouble breathing, this can be serious. If it is severe call 911 IMMEDIATELY. If it is mild, please call our office. . If you take any of these medications: Glipizide/Metformin, Avandament, Glucavance, please do not take 48 hours after completing test.        Follow-Up: Your physician recommends that you schedule a follow-up appointment in: 3 MONTHS  WITH DR END.   If you need a refill on your cardiac medications before your next appointment, please call your pharmacy.     Cardiac CT Angiogram A cardiac CT angiogram is a procedure to look at the heart and the area around the heart. It may be done to help find the cause of chest pains or other symptoms of heart disease. During this procedure, a large X-ray machine, called a CT scanner, takes detailed pictures of the heart and the surrounding area after a dye (contrast material) has been injected into blood vessels in the area. The procedure is also sometimes called a coronary CT angiogram, coronary artery scanning, or CTA. A cardiac CT angiogram allows the health care provider to see how well blood is flowing to and from the heart. The health care provider will be able to see if there are any problems, such as:  Blockage or narrowing of the coronary arteries in the heart.  Fluid around the heart.  Signs of weakness or disease in the muscles, valves, and tissues of the heart.  Tell a health care provider about:  Any allergies you have. This is especially important if you have had a previous allergic reaction to contrast dye.  All medicines you are taking, including vitamins, herbs, eye drops, creams, and over-the-counter medicines.  Any blood disorders you have.  Any surgeries you have had.  Any medical conditions you have.  Whether you are pregnant or may be pregnant.  Any anxiety disorders, chronic pain, or other conditions you have that may increase your stress or prevent you from lying still. What are the risks? Generally, this is a safe procedure. However, problems may occur, including:  Bleeding.  Infection.  Allergic reactions to medicines or dyes.  Damage to other structures or organs.  Kidney damage from the dye or contrast that is used.  Increased risk of cancer from radiation exposure. This risk is low. Talk with your health care provider about: ? The risks  and benefits of testing. ? How you can receive the lowest dose of radiation.  What happens before the procedure?  Wear comfortable clothing and remove any jewelry, glasses, dentures, and hearing aids.  Follow instructions from your health care provider about eating and drinking. This may include: ? For 12 hours before the test - avoid caffeine. This includes tea, coffee, soda, energy drinks, and diet pills. Drink plenty of water or other fluids that do not have caffeine in them. Being well-hydrated can prevent complications. ? For 4-6 hours before the test - stop eating and drinking. The contrast dye can cause nausea, but this is less likely if your stomach is empty.  Ask your health care provider about changing or stopping your regular medicines. This is especially important if you are taking diabetes medicines, blood thinners, or medicines to treat erectile dysfunction. What happens during the procedure?  Hair on your chest may need to be removed so that small sticky patches called electrodes can be placed on your chest. These will transmit information that helps to  monitor your heart during the test.  An IV tube will be inserted into one of your veins.  You might be given a medicine to control your heart rate during the test. This will help to ensure that good images are obtained.  You will be asked to lie on an exam table. This table will slide in and out of the CT machine during the procedure.  Contrast dye will be injected into the IV tube. You might feel warm, or you may get a metallic taste in your mouth.  You will be given a medicine (nitroglycerin) to relax (dilate) the arteries in your heart.  The table that you are lying on will move into the CT machine tunnel for the scan.  The person running the machine will give you instructions while the scans are being done. You may be asked to: ? Keep your arms above your head. ? Hold your breath. ? Stay very still, even if the table  is moving.  When the scanning is complete, you will be moved out of the machine.  The IV tube will be removed. The procedure may vary among health care providers and hospitals. What happens after the procedure?  You might feel warm, or you may get a metallic taste in your mouth from the contrast dye.  You may have a headache from the nitroglycerin.  After the procedure, drink water or other fluids to wash (flush) the contrast material out of your body.  Contact a health care provider if you have any symptoms of allergy to the contrast. These symptoms include: ? Shortness of breath. ? Rash or hives. ? A racing heartbeat.  Most people can return to their normal activities right after the procedure. Ask your health care provider what activities are safe for you.  It is up to you to get the results of your procedure. Ask your health care provider, or the department that is doing the procedure, when your results will be ready. Summary  A cardiac CT angiogram is a procedure to look at the heart and the area around the heart. It may be done to help find the cause of chest pains or other symptoms of heart disease.  During this procedure, a large X-ray machine, called a CT scanner, takes detailed pictures of the heart and the surrounding area after a dye (contrast material) has been injected into blood vessels in the area.  Ask your health care provider about changing or stopping your regular medicines before the procedure. This is especially important if you are taking diabetes medicines, blood thinners, or medicines to treat erectile dysfunction.  After the procedure, drink water or other fluids to wash (flush) the contrast material out of your body. This information is not intended to replace advice given to you by your health care provider. Make sure you discuss any questions you have with your health care provider. Document Released: 06/14/2008 Document Revised: 05/21/2016 Document  Reviewed: 05/21/2016 Elsevier Interactive Patient Education  2017 [place].     Holter Monitoring A Holter monitor is a small device that is used to detect abnormal heart rhythms. It clips to your clothing and is connected by wires to flat, sticky disks (electrodes) that attach to your chest. It is worn continuously for 24-48 hours. Follow these instructions at home:  Wear your Holter monitor at all times, even while exercising and sleeping, for as long as directed by your health care provider.  Make sure that the Holter monitor is safely clipped to  your clothing or close to your body as recommended by your health care provider.  Do not get the monitor or wires wet.  Do not put body lotion or moisturizer on your chest.  Keep your skin clean.  Keep a diary of your daily activities, such as walking and doing chores. If you feel that your heartbeat is abnormal or that your heart is fluttering or skipping a beat: ? Record what you are doing when it happens. ? Record what time of day the symptoms occur.  Return your Holter monitor as directed by your health care provider.  Keep all follow-up visits as directed by your health care provider. This is important. Get help right away if:  You feel lightheaded or you faint.  You have trouble breathing.  You feel pain in your chest, upper arm, or jaw.  You feel sick to your stomach and your skin is pale, cool, or damp.  You heartbeat feels unusual or abnormal. This information is not intended to replace advice given to you by your health care provider. Make sure you discuss any questions you have with your health care provider. Document Released: 03/30/2004 Document Revised: 12/08/2015 Document Reviewed: 02/08/2014 Elsevier Interactive Patient Education  2018 [employer].   Echocardiogram An echocardiogram, or echocardiography, uses sound waves (ultrasound) to produce an image of your heart. The echocardiogram is simple,  painless, obtained within a short period of time, and offers valuable information to your health care provider. The images from an echocardiogram can provide information such as:  Evidence of coronary artery disease (CAD).  Heart size.  Heart muscle function.  Heart valve function.  Aneurysm detection.  Evidence of a past heart attack.  Fluid buildup around the heart.  Heart muscle thickening.  Assess heart valve function.  Tell a health care provider about:  Any allergies you have.  All medicines you are taking, including vitamins, herbs, eye drops, creams, and over-the-counter medicines.  Any problems you or family members have had with anesthetic medicines.  Any blood disorders you have.  Any surgeries you have had.  Any medical conditions you have.  Whether you are pregnant or may be pregnant. What happens before the procedure? No special preparation is needed. Eat and drink normally. What happens during the procedure?  In order to produce an image of your heart, gel will be applied to your chest and a wand-like tool (transducer) will be moved over your chest. The gel will help transmit the sound waves from the transducer. The sound waves will harmlessly bounce off your heart to allow the heart images to be captured in real-time motion. These images will then be recorded.  You may need an IV to receive a medicine that improves the quality of the pictures. What happens after the procedure? You may return to your normal schedule including diet, activities, and medicines, unless your health care provider tells you otherwise. This information is not intended to replace advice given to you by your health care provider. Make sure you discuss any questions you have with your health care provider. Document Released: 06/29/2000 Document Revised: 02/18/2016 Document Reviewed: 03/09/2013 Elsevier Interactive Patient Education  2017 [employer].

## 2017-09-13 ENCOUNTER — Encounter: Payer: Self-pay | Admitting: Internal Medicine

## 2017-09-13 DIAGNOSIS — G4733 Obstructive sleep apnea (adult) (pediatric): Secondary | ICD-10-CM | POA: Diagnosis not present

## 2017-09-13 DIAGNOSIS — G4719 Other hypersomnia: Secondary | ICD-10-CM

## 2017-09-20 ENCOUNTER — Telehealth: Payer: Self-pay | Admitting: *Deleted

## 2017-09-20 DIAGNOSIS — G4733 Obstructive sleep apnea (adult) (pediatric): Secondary | ICD-10-CM

## 2017-09-20 NOTE — Telephone Encounter (Signed)
Patient contacted with results of HST. Orders placed. Nothing further needed.

## 2017-09-25 ENCOUNTER — Ambulatory Visit: Payer: Self-pay | Admitting: Internal Medicine

## 2017-09-26 ENCOUNTER — Other Ambulatory Visit: Payer: Self-pay | Admitting: Internal Medicine

## 2017-09-26 DIAGNOSIS — Z8249 Family history of ischemic heart disease and other diseases of the circulatory system: Secondary | ICD-10-CM

## 2017-09-26 DIAGNOSIS — R0789 Other chest pain: Secondary | ICD-10-CM

## 2017-09-26 DIAGNOSIS — R000 Tachycardia, unspecified: Secondary | ICD-10-CM

## 2017-09-30 ENCOUNTER — Telehealth: Payer: Self-pay | Admitting: *Deleted

## 2017-09-30 ENCOUNTER — Telehealth: Payer: Self-pay | Admitting: Internal Medicine

## 2017-09-30 NOTE — Telephone Encounter (Signed)
error 

## 2017-09-30 NOTE — Telephone Encounter (Signed)
Lmov for patient to call back Had a change in schedule.  Moved patient Echo to 1030 and Holter to be at 1130 am

## 2017-10-01 NOTE — Telephone Encounter (Signed)
Pt confirmed new times  Nothing else needed.

## 2017-10-04 ENCOUNTER — Ambulatory Visit (INDEPENDENT_AMBULATORY_CARE_PROVIDER_SITE_OTHER): Payer: BLUE CROSS/BLUE SHIELD

## 2017-10-04 ENCOUNTER — Other Ambulatory Visit: Payer: BLUE CROSS/BLUE SHIELD

## 2017-10-04 ENCOUNTER — Other Ambulatory Visit: Payer: Self-pay | Admitting: Internal Medicine

## 2017-10-04 DIAGNOSIS — R0789 Other chest pain: Secondary | ICD-10-CM | POA: Diagnosis not present

## 2017-10-04 DIAGNOSIS — Z8249 Family history of ischemic heart disease and other diseases of the circulatory system: Secondary | ICD-10-CM | POA: Diagnosis not present

## 2017-10-04 DIAGNOSIS — R000 Tachycardia, unspecified: Secondary | ICD-10-CM

## 2017-10-08 ENCOUNTER — Telehealth: Payer: Self-pay

## 2017-10-08 NOTE — Telephone Encounter (Signed)
lmov for patient to schedule holter 48 (came off )   Melanie Villarreal,   I cannot reinstate the order can you add a new one to link

## 2017-10-09 ENCOUNTER — Other Ambulatory Visit: Payer: Self-pay

## 2017-10-09 DIAGNOSIS — R55 Syncope and collapse: Secondary | ICD-10-CM

## 2017-10-09 DIAGNOSIS — R000 Tachycardia, unspecified: Secondary | ICD-10-CM

## 2017-10-09 NOTE — Telephone Encounter (Signed)
Yes and order is in.

## 2017-10-10 NOTE — Telephone Encounter (Signed)
lmov to schedule  °

## 2017-10-11 ENCOUNTER — Other Ambulatory Visit: Payer: Self-pay | Admitting: Family Medicine

## 2017-10-12 ENCOUNTER — Other Ambulatory Visit: Payer: Self-pay | Admitting: Family Medicine

## 2017-10-14 NOTE — Telephone Encounter (Signed)
She needs follow up

## 2017-10-15 ENCOUNTER — Ambulatory Visit
Admission: RE | Admit: 2017-10-15 | Discharge: 2017-10-15 | Disposition: A | Discharge status: Home / Self Care | Payer: BLUE CROSS/BLUE SHIELD | Source: Ambulatory Visit | Attending: Internal Medicine | Admitting: Internal Medicine

## 2017-10-15 ENCOUNTER — Telehealth: Payer: Self-pay | Admitting: Family Medicine

## 2017-10-15 DIAGNOSIS — R000 Tachycardia, unspecified: Secondary | ICD-10-CM | POA: Insufficient documentation

## 2017-10-15 NOTE — Telephone Encounter (Signed)
[phone]  Called [phone] @ 8:37am left voice message informing pt to schedule appt with Dr Ancil Boozer or with Benjamine Mola

## 2017-10-15 NOTE — Telephone Encounter (Signed)
PT had an appt with Raquel Sarna but we had to cancel and she has another one but is out of her medication below. 1. Cholesterol meds 2. Blood pressure Pt is totally out and her pharm is [facility] ( [address] [phone]

## 2017-10-16 ENCOUNTER — Other Ambulatory Visit: Payer: Self-pay | Admitting: Nurse Practitioner

## 2017-10-16 DIAGNOSIS — E119 Type 2 diabetes mellitus without complications: Secondary | ICD-10-CM

## 2017-10-16 MED ORDER — METOPROLOL TARTRATE 25 MG PO TABS
25.0000 mg | ORAL_TABLET | Freq: Two times a day (BID) | ORAL | 0 refills | Status: DC
Start: 2017-10-16 — End: 2018-01-20

## 2017-10-16 MED ORDER — OLMESARTAN MEDOXOMIL 5 MG PO TABS: 5.0000 mg | ORAL_TABLET | Freq: Every day | ORAL | 0 refills | Status: DC

## 2017-10-16 MED ORDER — METFORMIN HCL 1000 MG PO TABS: 1000.0000 mg | ORAL_TABLET | Freq: Two times a day (BID) | ORAL | 0 refills | Status: DC

## 2017-10-16 MED ORDER — ATORVASTATIN CALCIUM 20 MG PO TABS: 20.0000 mg | ORAL_TABLET | Freq: Every day | ORAL | 0 refills | Status: DC

## 2017-10-16 NOTE — Telephone Encounter (Signed)
Can you refill her medication last visit January 2019

## 2017-10-16 NOTE — Telephone Encounter (Signed)
Patient says she no longer needs to reapply per Ivin Booty.

## 2017-10-16 NOTE — Telephone Encounter (Signed)
lvm on [phone] @ 8:35 informing pt that prescription has been called into the pharmacy. But for her to keep her appt with Benjamine Mola

## 2017-10-17 NOTE — Telephone Encounter (Signed)
Left message for patient

## 2017-10-21 ENCOUNTER — Encounter: Payer: Self-pay | Admitting: Nurse Practitioner

## 2017-10-21 ENCOUNTER — Encounter: Payer: Self-pay | Admitting: Emergency Medicine

## 2017-10-21 ENCOUNTER — Ambulatory Visit: Payer: BLUE CROSS/BLUE SHIELD | Admitting: Nurse Practitioner

## 2017-10-21 DIAGNOSIS — I10 Essential (primary) hypertension: Secondary | ICD-10-CM | POA: Diagnosis not present

## 2017-10-21 DIAGNOSIS — F902 Attention-deficit hyperactivity disorder, combined type: Secondary | ICD-10-CM

## 2017-10-21 DIAGNOSIS — E119 Type 2 diabetes mellitus without complications: Secondary | ICD-10-CM | POA: Diagnosis not present

## 2017-10-21 DIAGNOSIS — E041 Nontoxic single thyroid nodule: Secondary | ICD-10-CM | POA: Diagnosis not present

## 2017-10-21 DIAGNOSIS — R000 Tachycardia, unspecified: Secondary | ICD-10-CM | POA: Diagnosis not present

## 2017-10-21 DIAGNOSIS — F330 Major depressive disorder, recurrent, mild: Secondary | ICD-10-CM | POA: Diagnosis not present

## 2017-10-21 DIAGNOSIS — E782 Mixed hyperlipidemia: Secondary | ICD-10-CM

## 2017-10-21 DIAGNOSIS — F411 Generalized anxiety disorder: Secondary | ICD-10-CM

## 2017-10-21 MED ORDER — SEMAGLUTIDE(0.25 OR 0.5MG/DOS) 2 MG/1.5ML ~~LOC~~ SOPN: 0.2500 mg | PEN_INJECTOR | SUBCUTANEOUS | 0 refills | Status: DC

## 2017-10-21 MED ORDER — METFORMIN HCL 1000 MG PO TABS: 1000.0000 mg | ORAL_TABLET | Freq: Two times a day (BID) | ORAL | 0 refills | Status: DC

## 2017-10-21 NOTE — Progress Notes (Addendum)
Name: Melanie Villarreal   MRN: [id number]    DOB: 1975/09/20   Date:10/21/2017       Progress Note  Subjective  Chief Complaint  Chief Complaint  Patient presents with  . Diabetes    ozempic refills  . Hypertension  . Hyperlipidemia    HPI  Hypertension olmesartan 5mg  and metoprolol 25mg ; 124/86 today. Limits salt in diet.   Obesity Has tried [employer] in the past.BMI 39.7 today. Has been in [employer] for 6 weeks so far, has lost 7 pounds and 18.5 inches. States not craving sweets as much but if she eats some it makes her nausea.   Diabetes Metformin and ozempic. States felt metformin prevented weight loss so she didn't pick up her refill 2-3 weeks ago. Patient states ozempic decreased cravings and increased energy. Patient states checks sugars when she feels it might be off but hasn't felt that way for a while. A few weeks ago checked it 150's. Drinks at least 64 ounces of water a day and goal is to drink 90 ounces of water. Denies polydipsa, polyphagia, polyuria   Lab Results  Component Value Date   HGBA1C 6.8 (H) 08/07/2017    Hyperlipidemia   Atorvastatin 20mg  take every night. Denies chest pain myalgias   Lab Results  Component Value Date   CHOL 145 08/07/2017   HDL 38 (L) 08/07/2017   LDLCALC 75 08/07/2017   TRIG 231 (H) 08/07/2017   CHOLHDL 3.8 08/07/2017   GAD/ADHD/MDD Managed by psychiatry- [facility] on vyvanse and sertraline and ativan. States is well-controlled on regimen; endorses increased energy since weight loss. No SI/HI  Tachycardia  Seen by cards- cleared; no more episodes since she stopped drinking caffeine.   Patient Active Problem List   Diagnosis Date Noted  . Family history of premature CAD 09/11/2017  . Thyroid nodule 08/15/2017  . LVH (left ventricular hypertrophy) 02/01/2017  . Elevated serum glutamic pyruvic transaminase (SGPT) level 07/24/2016  . Morbid obesity ([place]) 03/23/2016  . Sinus tachycardia 02/24/2016  . Valvular regurgitation  01/11/2016  . Chronic prescription benzodiazepine use 12/30/2015  . ADHD (attention deficit hyperactivity disorder), combined type 08/30/2015  . Cough due to ACE inhibitor 03/14/2015  . Generalized anxiety disorder 12/31/2014  . Major depressive disorder, recurrent episode ([place]) 10/25/2014  . Diabetes mellitus type 2, controlled, without complications ([place]) 84/16/6063  . HLD (hyperlipidemia) 10/25/2014  . Essential hypertension 10/25/2014    Past Medical History:  Diagnosis Date  . ADHD (attention deficit hyperactivity disorder), combined type   . Allergy   . Anxiety   . Depression, controlled   . Diabetes mellitus without complication ([place])   . Generalized anxiety disorder 12/31/2014  . HTN, goal below 140/90   . Hyperlipidemia   . Hypertension goal BP (blood pressure) < 130/80 10/25/2014  . LVH (left ventricular hypertrophy) 02/01/2017   No CHF, on echo 2016, Dr. Clayborn Bigness  . Morbid obesity ([place])   . Panic disorder with agoraphobia and moderate panic attacks 12/30/2015  . Thyroid nodule 08/15/2017    Past Surgical History:  Procedure Laterality Date  . CESAREAN SECTION    . thyroid nodule biopsy    . TUBAL LIGATION      Social History   Tobacco Use  . Smoking status: Former Smoker    Types: Cigarettes, E-cigarettes    Last attempt to quit: 11/28/2015    Years since quitting: 1.8  . Smokeless tobacco: Never Used  . Tobacco comment: On and off since 18 yrs of  age; now using e-cigarettes  Substance Use Topics  . Alcohol use: Yes    Alcohol/week: 0.0 oz    Frequency: Never    Comment: rare     Current Outpatient Medications:  .  aspirin EC 81 MG tablet, Take 1 tablet (81 mg total) by mouth daily., Disp: , Rfl:  .  atorvastatin (LIPITOR) 20 MG tablet, Take 1 tablet (20 mg total) by mouth at bedtime., Disp: 30 tablet, Rfl: 0 .  LORazepam (ATIVAN) 0.5 MG tablet, Take 1 mg by mouth daily. , Disp: , Rfl: 0 .  metoprolol tartrate (LOPRESSOR) 25 MG tablet, Take 1 tablet (25  mg total) by mouth 2 (two) times daily., Disp: 60 tablet, Rfl: 0 .  olmesartan (BENICAR) 5 MG tablet, Take 1 tablet (5 mg total) by mouth daily., Disp: 30 tablet, Rfl: 0 .  Semaglutide (OZEMPIC) 0.25 or 0.5 MG/DOSE SOPN, Inject into the skin., Disp: , Rfl:  .  sertraline (ZOLOFT) 100 MG tablet, Take 1.5 tablets (150 mg total) by mouth daily. (Patient taking differently: Take 100 mg by mouth daily. ), Disp: 45 tablet, Rfl: 6 .  VYVANSE 50 MG capsule, Take 50 mg by mouth daily., Disp: , Rfl: 0 .  metFORMIN (GLUCOPHAGE) 1000 MG tablet, Take 1 tablet (1,000 mg total) by mouth 2 (two) times daily with a meal. (Patient not taking: Reported on 10/21/2017), Disp: 60 tablet, Rfl: 0  Allergies  Allergen Reactions  . Penicillins Anaphylaxis  . Latex Hives and Rash    ROS   Constitutional: Negative for fever or unintentional weight change.  Respiratory: Negative for cough and shortness of breath.   Cardiovascular: Negative for chest pain or palpitations.  Gastrointestinal: Negative for abdominal pain, no bowel changes.  Musculoskeletal: Negative for gait problem or joint swelling.  Skin: Negative for rash.  Neurological: Negative for dizziness or headache-used to have migraines states has resolved when she got daith-ear pierced.  No other specific complaints in a complete review of systems (except as listed in HPI above).  Objective  Vitals:   10/21/17 0828  BP: 124/86  Pulse: 89  Resp: 16  Temp: 98.4 F (36.9 C)  TempSrc: Oral  SpO2: 96%  Weight: 238 lb 4.8 oz (108.1 kg)  Height: 5\' 5"  (1.651 m)    Body mass index is 39.66 kg/m.  Nursing Note and Vital Signs reviewed.  Physical Exam   Constitutional: Patient appears well-developed and well-nourished. Obese .No distress.  HEENT: head atraumatic, normocephalic, pupils equal and reactive to light, T  no maxillary or frontal sinus tenderness , neck supple without lymphadenopathy thyroimegaly, oropharynx pink and moist without exudate,  no nasal discharge Cardiovascular: Normal rate, regular rhythm, S1/S2 present.  No murmur or rub heard. No carotid btuir Pulmonary/Chest: Effort normal and breath sounds clear. No respiratory distress or retractions. Abdominal: Soft and non-tender, bowel sounds present, no CVA tenderness  Psychiatric: Patient has a normal mood and affect. behavior is normal. Judgment and thought content normal.  No results found for this or any previous visit (from the past 72 hour(s)).  Assessment & Plan  1. Controlled type 2 diabetes mellitus without complication, without long-term current use of insulin (HCC)  - metFORMIN (GLUCOPHAGE) 1000 MG tablet; Take 1 tablet (1,000 mg total) by mouth 2 (two) times daily with a meal.  Dispense: 60 tablet; Refill: 0 - Semaglutide (OZEMPIC) 0.25 or 0.5 MG/DOSE SOPN; Inject 0.25 mg into the skin once a week for 14 days. Then go up to 0.5 mg once a  week.  Dispense: 4 pen; Refill: 0  2. Morbid obesity ([place]) Great job with your weight loss-continue weight watchers, ozempic, drinking lots of water and eating well - metFORMIN (GLUCOPHAGE) 1000 MG tablet; Take 1 tablet (1,000 mg total) by mouth 2 (two) times daily with a meal.  Dispense: 60 tablet; Refill: 0 - Semaglutide (OZEMPIC) 0.25 or 0.5 MG/DOSE SOPN; Inject 0.25 mg into the skin once a week for 14 days. Then go up to 0.5 mg once a week.  Dispense: 4 pen; Refill: 0  3. Essential hypertension Controlled today on Lopressor and Benicar; patient has cuff at home we will continue to monitor due to mildly elevated diastolic.  Continue DASH diet  4. Thyroid nodule Fine aspiration biopsy of thyroid nodule completed in February 2019 noted to be consistent with benign follicular nodule  5. Mixed hyperlipidemia Patient takes statin nightly; last lipids controlled; continue with healthy diet, weight loss  6. ADHD (attention deficit hyperactivity disorder), combined type Patient notes is well controlled continue with current  therapies  7. Sinus tachycardia Patient followed up with cards in the past-states was released due to resolving symptoms when stopping caffeine  8. Mild episode of recurrent major depressive disorder (HCC) -stable, no changes in treatment regimen  9. Generalized anxiety disorder -stable, no changes in treatment regimen    -Red flags and when to present for emergency care or RTC including fever >101.82F, chest pain, shortness of breath, new/worsening/un-resolving symptoms,  reviewed with patient at time of visit. Follow up and care instructions discussed and provided in AVS. -Reviewed Health Maintenance: will get papsmear next  Visit   I have reviewed this encounter including the documentation in this note and/or discussed this patient with the provider, Suezanne Cheshire DNP AGNP-C. I am certifying that I agree with the content of this note as supervising physician. Steele Sizer, MD [facility] 10/21/2017, 5:48 PM

## 2017-10-21 NOTE — Patient Instructions (Signed)
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 :)

## 2017-11-05 ENCOUNTER — Ambulatory Visit: Payer: BLUE CROSS/BLUE SHIELD | Admitting: Family Medicine

## 2017-11-14 ENCOUNTER — Other Ambulatory Visit: Payer: Self-pay | Admitting: Nurse Practitioner

## 2017-11-14 DIAGNOSIS — E119 Type 2 diabetes mellitus without complications: Secondary | ICD-10-CM

## 2017-12-31 ENCOUNTER — Telehealth: Payer: Self-pay | Admitting: *Deleted

## 2017-12-31 NOTE — Telephone Encounter (Signed)
Melanie Villarreal who scheduled Cardiac CT tried several times to reach out to patient to schedule. After multiple messages left with patient to call back to schedule, patient did not call back.  Attempted to reach patient today. No answer. Left message to call back. Patient has appointment with Dr End tomorrow.

## 2017-12-31 NOTE — Progress Notes (Deleted)
Follow-up Outpatient Visit Date: 01/01/2018  Primary Care Provider: [facility], [place] [id number]  Chief Complaint: ***  HPI:  Melanie Villarreal is a 42 y.o. year-old female with history of hypertension, hyperlipidemia, DM2,, obesity, and anxiety/depression, who presents for follow-up of tachycardia.  I met her in February, with which time she noted elevated heart rates over the preceding 2 to 3 years.  She noted occasional dizziness and lightheadedness as well as to syncopal/near syncopal episodes during her lifetime.  She also reported occasional atypical chest pain.  We agreed to obtain a 48-hour Holter monitor and transthoracic echocardiogram (see details below).  I also recommended that she stop Vyvanse, as this could be contributing to her elevated heart rate.  We also ordered a coronary CTA for evaluation of her chest pain, though she never went through with this.  --------------------------------------------------------------------------------------------------  Cardiovascular History & Procedures: Cardiovascular Problems:  Tachycardia  Risk Factors:  Hypertension, hyperlipidemia, diabetes, obesity, family history, and tobacco use  Cath/PCI:  None  CV Surgery:  None  EP Procedures and Devices:  48-hour Holter monitor (10/04/2017): Sinus rhythm.  No arrhythmias.  Average heart rate 85 bpm (range 50 to 150 bpm).  Non-Invasive Evaluation(s):  TTE (10/04/2017): Normal LV size.  LVEF 60 to 65% with normal wall motion.  Grade 1 diastolic dysfunction.  Mild MR.  Normal RV size and function.  Normal PA pressure.  TTE (05/31/15, [facility]): Normal LV size with mild LVH. LVEF >55%. Mild MR, PR, and TR. Normal RV size and function.  Exercise MPI (05/31/15): Normal myocardial perfusion. Patient exercised 9:00 minutes (10.4) without significant EKG changes.   Recent CV Pertinent Labs: Lab Results  Component Value Date   CHOL 145 08/07/2017   HDL 38 (L) 08/07/2017   LDLCALC 75 08/07/2017   TRIG 231 (H) 08/07/2017   CHOLHDL 3.8 08/07/2017   K 4.1 08/07/2017   K 3.7 08/18/2014   BUN 14 08/07/2017   BUN 11 08/18/2014   CREATININE 0.75 08/07/2017    Past medical and surgical history were reviewed and updated in EPIC.  No outpatient medications have been marked as taking for the 01/01/18 encounter (Appointment) with Wakisha Alberts, Melanie Gave, MD.    Allergies: Penicillins and Latex  Social History   Tobacco Use  . Smoking status: Former Smoker    Types: Cigarettes, E-cigarettes    Last attempt to quit: 06/29/2017    Years since quitting: 0.5  . Smokeless tobacco: Never Used  . Tobacco comment: On and off since 34 yrs of age; now using e-cigarettes  Substance Use Topics  . Alcohol use: Yes    Alcohol/week: 0.0 oz    Frequency: Never    Comment: rare  . Drug use: No    Family History  Problem Relation Age of Onset  . Heart disease Mother   . Hyperlipidemia Mother   . Hypertension Mother   . Miscarriages / Stillbirths Mother        2  . Anxiety disorder Mother   . Coronary artery disease Mother 49       11 stents  . Alcohol abuse Father   . Cancer Father        testicular  . Depression Father   . Heart disease Sister   . Hyperlipidemia Sister   . Hypertension Sister   . Drug abuse Brother   . Alcohol abuse Brother   . Depression Brother   . ADD / ADHD Son   .  Mental illness Maternal Aunt   . Alcohol abuse Maternal Grandfather   . Depression Maternal Grandfather   . Anxiety disorder Maternal Grandfather   . Cancer Paternal Grandmother   . Alcohol abuse Paternal Grandfather     Review of Systems: A 12-system review of systems was performed and was negative except as noted in the HPI.  --------------------------------------------------------------------------------------------------  Physical Exam: There were no vitals taken for this visit.  General:  *** HEENT: No conjunctival  pallor or scleral icterus. Moist mucous membranes.  OP clear. Neck: Supple without lymphadenopathy, thyromegaly, JVD, or HJR. No carotid bruit. Lungs: Normal work of breathing. Clear to auscultation bilaterally without wheezes or crackles. Heart: Regular rate and rhythm without murmurs, rubs, or gallops. Non-displaced PMI. Abd: Bowel sounds present. Soft, NT/ND without hepatosplenomegaly Ext: No lower extremity edema. Radial, PT, and DP pulses are 2+ bilaterally. Skin: Warm and dry without rash.  EKG:  ***  Lab Results  Component Value Date   WBC 10.0 08/07/2017   HGB 14.7 08/07/2017   HCT 43.5 08/07/2017   MCV 83.3 08/07/2017   PLT 389 08/07/2017    Lab Results  Component Value Date   NA 139 08/07/2017   K 4.1 08/07/2017   CL 102 08/07/2017   CO2 26 08/07/2017   BUN 14 08/07/2017   CREATININE 0.75 08/07/2017   GLUCOSE 150 (H) 08/07/2017   ALT 37 (H) 08/07/2017    Lab Results  Component Value Date   CHOL 145 08/07/2017   HDL 38 (L) 08/07/2017   LDLCALC 75 08/07/2017   TRIG 231 (H) 08/07/2017   CHOLHDL 3.8 08/07/2017    --------------------------------------------------------------------------------------------------  ASSESSMENT AND PLAN: Melanie Gave Mccartney Chuba, MD 12/31/2017 9:00 PM

## 2018-01-01 ENCOUNTER — Ambulatory Visit: Payer: BLUE CROSS/BLUE SHIELD | Admitting: Internal Medicine

## 2018-01-02 ENCOUNTER — Encounter: Payer: Self-pay | Admitting: Internal Medicine

## 2018-01-08 ENCOUNTER — Encounter: Payer: Self-pay | Admitting: Internal Medicine

## 2018-01-17 ENCOUNTER — Other Ambulatory Visit: Payer: Self-pay | Admitting: Nurse Practitioner

## 2018-01-17 DIAGNOSIS — E119 Type 2 diabetes mellitus without complications: Secondary | ICD-10-CM

## 2018-01-20 ENCOUNTER — Encounter: Payer: Self-pay | Admitting: Nurse Practitioner

## 2018-01-20 ENCOUNTER — Ambulatory Visit (INDEPENDENT_AMBULATORY_CARE_PROVIDER_SITE_OTHER): Payer: BLUE CROSS/BLUE SHIELD | Admitting: Nurse Practitioner

## 2018-01-20 ENCOUNTER — Other Ambulatory Visit (HOSPITAL_COMMUNITY)
Admission: RE | Admit: 2018-01-20 | Discharge: 2018-01-20 | Disposition: A | Discharge status: Home / Self Care | Payer: BLUE CROSS/BLUE SHIELD | Source: Ambulatory Visit | Attending: Family Medicine | Admitting: Family Medicine

## 2018-01-20 VITALS — BP 100/88 | HR 94 | Temp 98.2°F | Resp 16 | Ht 64.96 in | Wt 229.2 lb

## 2018-01-20 DIAGNOSIS — Z124 Encounter for screening for malignant neoplasm of cervix: Secondary | ICD-10-CM

## 2018-01-20 DIAGNOSIS — F3342 Major depressive disorder, recurrent, in full remission: Secondary | ICD-10-CM | POA: Diagnosis not present

## 2018-01-20 DIAGNOSIS — Z113 Encounter for screening for infections with a predominantly sexual mode of transmission: Secondary | ICD-10-CM

## 2018-01-20 DIAGNOSIS — E119 Type 2 diabetes mellitus without complications: Secondary | ICD-10-CM

## 2018-01-20 DIAGNOSIS — I10 Essential (primary) hypertension: Secondary | ICD-10-CM

## 2018-01-20 DIAGNOSIS — Z0000 Encounter for general adult medical examination without abnormal findings: Secondary | ICD-10-CM | POA: Diagnosis not present

## 2018-01-20 DIAGNOSIS — Z114 Encounter for screening for human immunodeficiency virus [HIV]: Secondary | ICD-10-CM

## 2018-01-20 DIAGNOSIS — Z5181 Encounter for therapeutic drug level monitoring: Secondary | ICD-10-CM

## 2018-01-20 MED ORDER — SEMAGLUTIDE(0.25 OR 0.5MG/DOS) 2 MG/1.5ML ~~LOC~~ SOPN: 0.5000 mg | PEN_INJECTOR | SUBCUTANEOUS | 2 refills | Status: DC

## 2018-01-20 MED ORDER — METOPROLOL TARTRATE 25 MG PO TABS: 25.0000 mg | ORAL_TABLET | Freq: Two times a day (BID) | ORAL | 1 refills | Status: DC

## 2018-01-20 MED ORDER — SERTRALINE HCL 100 MG PO TABS: 100.0000 mg | ORAL_TABLET | Freq: Every day | ORAL | 0 refills | Status: DC

## 2018-01-20 NOTE — Patient Instructions (Signed)
-   goal is for 64 ounces of water a day - jump back in boot camp classes - amazing job with weight loss so far and quitting smoking! - keep skin fold areas as clean and dry as you can

## 2018-01-20 NOTE — Addendum Note (Signed)
Addended by: Reda Gettis G on: 01/20/2018 03:25 PM   Modules accepted: Orders

## 2018-01-20 NOTE — Progress Notes (Addendum)
Name: Melanie Villarreal   MRN: [id number]    DOB: October 06, 1975   Date:01/20/2018       Progress Note  Subjective  Chief Complaint  Chief Complaint  Patient presents with  . Annual Exam    HPI  Patient presents for annual CPE .  Diet: has been doing intermittent fasting with [employer], eats first meal at 1pm and snacks on cucumbers, cereal, rarely ice cream. Then eats dinner- one grilled meat and veggies. Drinks a lot of water; recently has been drinking soda even though she hasn't in a few years- but is aware and is planning to cut it out again.  Exercise: boot camp- hasn't gone in 4 weeks because family was in town; but had been going 3 times a week doing cardio, stretching, and strength training. States has been playing with kids; and gone swimming a few times a week.   USPSTF grade A and B recommendations  Depression:  Depression screen [facility] 2/9 01/20/2018 08/07/2017 02/01/2017 01/04/2017 07/24/2016  Decreased Interest 0 0 0 0 0  Down, Depressed, Hopeless 0 0 0 0 0  PHQ - 2 Score 0 0 0 0 0   Hypertension: BP Readings from Last 3 Encounters:  01/20/18 100/88  10/21/17 124/86  09/11/17 132/90   Obesity: Wt Readings from Last 3 Encounters:  01/20/18 229 lb 3.2 oz (104 kg)  10/21/17 238 lb 4.8 oz (108.1 kg)  09/11/17 244 lb 4 oz (110.8 kg)   BMI Readings from Last 3 Encounters:  01/20/18 38.19 kg/m  10/21/17 39.66 kg/m  09/11/17 40.65 kg/m    Alcohol: rarely  Tobacco use: not since may; smoked till dec 2018- switched to vaping and then cut out completely in May  HIV, hep B, hep C: will test for HIV- works with three HIV clients, no bodily contact but precautions  STD testing and prevention (chl/gon/syphilis): will test, monogomous but would still like testing just in case.  Intimate partner violence: denies  Sexual History/Pain during Intercourse: states daily sex with husband; uses toys with some pain with toys- discussed need for lubrication and prevention of injury trauma.   Menstrual History/LMP/Abnormal Bleeding: denies abnormal bleeding Incontinence Symptoms: notes stress incontinence when bending; discussed kegels   Advanced Care Planning: A voluntary discussion about advance care planning including the explanation and discussion of advance directives.  Discussed health care proxy and Living will, and the patient was able to identify a health care proxy as husband- brandon Agricultural engineer .  Patient does have a living will at present time. If patient does have living will, I have requested they bring this to the clinic to be scanned in to their chart.  Breast cancer: paternal grandmother had breast cancer, father had testicular cancer.  Cervical cancer screening: due today   Lipids:  Lab Results  Component Value Date   CHOL 145 08/07/2017   CHOL 121 05/03/2017   CHOL 123 08/28/2016   Lab Results  Component Value Date   HDL 38 (L) 08/07/2017   HDL 33 (L) 05/03/2017   HDL 33 (L) 08/28/2016   Lab Results  Component Value Date   LDLCALC 75 08/07/2017   LDLCALC 64 05/03/2017   LDLCALC 51 08/28/2016   Lab Results  Component Value Date   TRIG 231 (H) 08/07/2017   TRIG 161 (H) 05/03/2017   TRIG 197 (H) 08/28/2016   Lab Results  Component Value Date   CHOLHDL 3.8 08/07/2017   CHOLHDL 3.7 05/03/2017   CHOLHDL 3.7 08/28/2016  No results found for: LDLDIRECT  Glucose:  Glucose  Date Value Ref Range Status  08/18/2014 135 (H) 65 - 99 mg/dL Final   Glucose, Bld  Date Value Ref Range Status  08/07/2017 150 (H) 65 - 99 mg/dL Final    Comment:    .            Fasting reference interval . For someone without known diabetes, a glucose value >125 mg/dL indicates that they may have diabetes and this should be confirmed with a follow-up test. .   05/03/2017 122 (H) 65 - 99 mg/dL Final    Comment:    .            Fasting reference interval . For someone without known diabetes, a glucose value between 100 and 125 mg/dL is consistent  with prediabetes and should be confirmed with a follow-up test. .   08/28/2016 131 (H) 65 - 99 mg/dL Final    Skin cancer: no personal or family history, does not sunscreen     Patient Active Problem List   Diagnosis Date Noted  . Family history of premature CAD 09/11/2017  . Thyroid nodule 08/15/2017  . LVH (left ventricular hypertrophy) 02/01/2017  . Elevated serum glutamic pyruvic transaminase (SGPT) level 07/24/2016  . Morbid obesity ([place]) 03/23/2016  . Sinus tachycardia 02/24/2016  . Valvular regurgitation 01/11/2016  . Chronic prescription benzodiazepine use 12/30/2015  . ADHD (attention deficit hyperactivity disorder), combined type 08/30/2015  . Cough due to ACE inhibitor 03/14/2015  . Generalized anxiety disorder 12/31/2014  . Major depressive disorder, recurrent episode ([place]) 10/25/2014  . Diabetes mellitus type 2, controlled, without complications ([place]) 70/62/3762  . HLD (hyperlipidemia) 10/25/2014  . Essential hypertension 10/25/2014    Past Surgical History:  Procedure Laterality Date  . CESAREAN SECTION    . thyroid nodule biopsy    . TUBAL LIGATION      Family History  Problem Relation Age of Onset  . Heart disease Mother   . Hyperlipidemia Mother   . Hypertension Mother   . Miscarriages / Stillbirths Mother        2  . Anxiety disorder Mother   . Coronary artery disease Mother 34       11 stents  . Alcohol abuse Father   . Cancer Father        testicular  . Depression Father   . Heart disease Sister   . Hyperlipidemia Sister   . Hypertension Sister   . Drug abuse Brother   . Alcohol abuse Brother   . Depression Brother   . ADD / ADHD Son   . Mental illness Maternal Aunt   . Alcohol abuse Maternal Grandfather   . Depression Maternal Grandfather   . Anxiety disorder Maternal Grandfather   . Cancer Paternal Grandmother   . Alcohol abuse Paternal Grandfather     Social History   Socioeconomic History  . Marital status: Married     Spouse name: Erlene Quan  . Number of children: 3  . Years of education: 2 Masters  . Highest education level: Master's degree (e.g., MA, MS, MEng, MEd, MSW, MBA)  Occupational History  . Not on file  Social Needs  . Financial resource strain: Not very hard  . Food insecurity:    Worry: Patient refused    Inability: Patient refused  . Transportation needs:    Medical: Patient refused    Non-medical: Patient refused  Tobacco Use  . Smoking status: Former Smoker  Types: Cigarettes, E-cigarettes    Last attempt to quit: 06/29/2017    Years since quitting: 0.5  . Smokeless tobacco: Never Used  . Tobacco comment: On and off since 60 yrs of age; now using e-cigarettes  Substance and Sexual Activity  . Alcohol use: Yes    Alcohol/week: 0.0 oz    Frequency: Never    Comment: rare  . Drug use: No  . Sexual activity: Yes    Partners: Male  Lifestyle  . Physical activity:    Days per week: 3 days    Minutes per session: 60 min  . Stress: Only a little  Relationships  . Social connections:    Talks on phone: More than three times a week    Gets together: Once a week    Attends religious service: More than 4 times per year    Active member of club or organization: Yes    Attends meetings of clubs or organizations: 1 to 4 times per year    Relationship status: Married  . Intimate partner violence:    Fear of current or ex partner: No    Emotionally abused: No    Physically abused: No    Forced sexual activity: No  Other Topics Concern  . Not on file  Social History Narrative  . Not on file     Current Outpatient Medications:  .  aspirin EC 81 MG tablet, Take 1 tablet (81 mg total) by mouth daily., Disp: , Rfl:  .  atorvastatin (LIPITOR) 20 MG tablet, Take 1 tablet (20 mg total) by mouth at bedtime., Disp: 30 tablet, Rfl: 0 .  LORazepam (ATIVAN) 0.5 MG tablet, Take 1 mg by mouth daily. , Disp: , Rfl: 0 .  metFORMIN (GLUCOPHAGE) 1000 MG tablet, Take 1 tablet (1,000 mg total)  by mouth 2 (two) times daily with a meal., Disp: 60 tablet, Rfl: 0 .  olmesartan (BENICAR) 5 MG tablet, Take 1 tablet (5 mg total) by mouth daily., Disp: 30 tablet, Rfl: 0 .  Semaglutide (OZEMPIC) 0.25 or 0.5 MG/DOSE SOPN, Inject 0.5 mg into the skin once a week., Disp: 4 pen, Rfl: 1 .  sertraline (ZOLOFT) 100 MG tablet, Take 1.5 tablets (150 mg total) by mouth daily. (Patient taking differently: Take 100 mg by mouth daily. ), Disp: 45 tablet, Rfl: 6 .  VYVANSE 50 MG capsule, Take 50 mg by mouth daily., Disp: , Rfl: 0 .  metoprolol tartrate (LOPRESSOR) 25 MG tablet, Take 1 tablet (25 mg total) by mouth 2 (two) times daily., Disp: 60 tablet, Rfl: 0  Allergies  Allergen Reactions  . Penicillins Anaphylaxis  . Latex Hives and Rash     ROS  Constitutional: Negative for fever or unintentional weight change.  Respiratory: Negative for cough and shortness of breath.   Cardiovascular: Negative for chest pain or palpitations.  Gastrointestinal: Negative for abdominal pain, no bowel changes.  Musculoskeletal: Negative for gait problem or joint swelling.  Skin: Negative for rash.  Neurological: Negative for dizziness or headache.  No other specific complaints in a complete review of systems (except as listed in HPI above).   Objective  Vitals:   01/20/18 0920  BP: 100/88  Pulse: 94  Resp: 16  Temp: 98.2 F (36.8 C)  TempSrc: Oral  SpO2: 97%  Weight: 229 lb 3.2 oz (104 kg)  Height: 5' 4.96" (1.65 m)    Body mass index is 38.19 kg/m.  Physical Exam  Constitutional: Patient appears well-developed and well-nourished. No distress.  HENT: Head: Normocephalic and atraumatic. Ears: B TMs ok, no erythema or effusion; Nose: Nose normal. Mouth/Throat: Oropharynx is clear and moist. No oropharyngeal exudate.  Eyes: Conjunctivae and EOM are normal. Pupils are equal, round, and reactive to light. No scleral icterus.  Neck: Normal range of motion. Neck supple. No JVD present. Goiter present-  pt notes it is stable states sees ENT; has had normal [place].  Cardiovascular: Normal rate, regular rhythm and normal heart sounds.  No murmur heard. No BLE edema. Pulmonary/Chest: Effort normal and breath sounds normal. No respiratory distress. Abdominal: Soft. Bowel sounds are normal, no distension. There is no tenderness. no masses Breast: no lumps or masses, no nipple discharge or rashes FEMALE GENITALIA:  External genitalia normal External urethra normal Vaginal vault normal without discharge or lesions Cervix -difficult to visualized with white non-foul smelling discharge no lesions Musculoskeletal: Normal range of motion, no joint effusions. No gross deformities Neurological: he is alert and oriented to person, place, and time. No cranial nerve deficit. Coordination, balance, strength, speech and gait are normal.  Skin: Skin is warm and dry. No rash noted. No erythema.  Psychiatric: Patient has a normal mood and affect. behavior is normal. Judgment and thought content normal.   No results found for this or any previous visit (from the past 2160 hour(s)).    Fall Risk: Fall Risk  01/20/2018 08/07/2017 02/01/2017 01/04/2017 07/24/2016  Falls in the past year? No No No No No  Number falls in past yr: - - - - -  Injury with Fall? - - - - -     Functional Status Survey: Is the patient deaf or have difficulty hearing?: No Does the patient have difficulty seeing, even when wearing glasses/contacts?: No Does the patient have difficulty concentrating, remembering, or making decisions?: No Does the patient have difficulty walking or climbing stairs?: No Does the patient have difficulty dressing or bathing?: No Does the patient have difficulty doing errands alone such as visiting a doctor's office or shopping?: No  Assessment & Plan  1. Routine general medical examination at a health care facility - goal is for 64 ounces of water a day - jump back in boot camp classes - amazing job with  weight loss so far and quitting smoking! - C. trachomatis/N. gonorrhoeae RNA - COMPLETE METABOLIC PANEL WITH GFR - CBC - Lipid Profile - HgB A1c  2. Controlled type 2 diabetes mellitus without complication, without long-term current use of insulin (HCC) - Semaglutide (OZEMPIC) 0.25 or 0.5 MG/DOSE SOPN; Inject 0.5 mg into the skin once a week.  Dispense: 4 pen; Refill: 2  3. Morbid obesity ([place]) - Semaglutide (OZEMPIC) 0.25 or 0.5 MG/DOSE SOPN; Inject 0.5 mg into the skin once a week.  Dispense: 4 pen; Refill: 2  4. Major depressive disorder, recurrent, in full remission with anxious distress ([place]) Cont med management and visit with psychiatry   5. Essential hypertension - metoprolol tartrate (LOPRESSOR) 25 MG tablet; Take 1 tablet (25 mg total) by mouth 2 (two) times daily.  Dispense: 180 tablet; Refill: 1  6. Screening for cervical cancer - Pap IG and HPV (high risk) DNA detection  7. Screening for HIV (human immunodeficiency virus) - HIV antibody (with reflex)  8. Screen for STD (sexually transmitted disease) - C. trachomatis/N. gonorrhoeae RNA     -USPSTF grade A and B recommendations reviewed with patient; age-appropriate recommendations, preventive care, screening tests, etc discussed and encouraged; healthy living encouraged; see AVS for patient education given to patient -Discussed  importance of 150 minutes of physical activity weekly, eat two servings of fish weekly, eat one serving of tree nuts ( cashews, pistachios, pecans, almonds.Marland Kitchen) every other day, eat 6 servings of fruit/vegetables daily and drink plenty of water and avoid sweet beverages.  -Red flags and when to present for emergency care or RTC including fever >101.51F, chest pain, shortness of breath, new/worsening/un-resolving symptoms,  reviewed with patient at time of visit. Follow up and care instructions discussed and provided in AVS.  --------------------------------- I have reviewed this encounter including  the documentation in this note and/or discussed this patient with the provider, Suezanne Cheshire DNP AGNP-C. I am certifying that I agree with the content of this note as supervising physician. Enid Derry, [facility] 01/20/2018, 4:52 PM

## 2018-01-21 LAB — COMPLETE METABOLIC PANEL WITH GFR
AG Ratio: 1.7 (calc) (ref 1.0–2.5)
ALT: 24 U/L (ref 6–29)
AST: 20 U/L (ref 10–30)
Albumin: 4.7 g/dL (ref 3.6–5.1)
Alkaline phosphatase (APISO): 78 U/L (ref 33–115)
BUN: 14 mg/dL (ref 7–25)
CALCIUM: 9.6 mg/dL (ref 8.6–10.2)
CHLORIDE: 107 mmol/L (ref 98–110)
CO2: 21 mmol/L (ref 20–32)
Creat: 0.73 mg/dL (ref 0.50–1.10)
GFR, EST AFRICAN AMERICAN: 120 mL/min/{1.73_m2} (ref >=60)
GFR, Est Non African American: 104 mL/min/{1.73_m2} (ref >=60)
GLUCOSE: 117 mg/dL — AB (ref 65–99)
Globulin: 2.8 g/dL (calc) (ref 1.9–3.7)
Potassium: 4.2 mmol/L (ref 3.5–5.3)
Sodium: 139 mmol/L (ref 135–146)
Total Bilirubin: 1.2 mg/dL (ref 0.2–1.2)
Total Protein: 7.5 g/dL (ref 6.1–8.1)

## 2018-01-21 LAB — CERVICOVAGINAL ANCILLARY ONLY
Chlamydia: NEGATIVE
Neisseria Gonorrhea: NEGATIVE
TRICH (WINDOWPATH): NEGATIVE

## 2018-01-21 LAB — CBC
HEMATOCRIT: 41.8 % (ref 35.0–45.0)
Hemoglobin: 14.4 g/dL (ref 11.7–15.5)
MCH: 28.5 pg (ref 27.0–33.0)
MCHC: 34.4 g/dL (ref 32.0–36.0)
MCV: 82.8 fL (ref 80.0–100.0)
MPV: 10.8 fL (ref 7.5–12.5)
Platelets: 348 10*3/uL (ref 140–400)
RBC: 5.05 10*6/uL (ref 3.80–5.10)
RDW: 13.5 % (ref 11.0–15.0)
WBC: 9.5 10*3/uL (ref 3.8–10.8)

## 2018-01-21 LAB — CYTOLOGY - PAP
Adequacy: ABSENT
DIAGNOSIS: NEGATIVE

## 2018-01-21 LAB — LIPID PANEL
CHOL/HDL RATIO: 4.1 (calc) (ref <5.0)
CHOLESTEROL: 160 mg/dL (ref <200)
HDL: 39 mg/dL — AB (ref >50)
LDL CHOLESTEROL (CALC): 94 mg/dL
NON-HDL CHOLESTEROL (CALC): 121 mg/dL (ref <130)
Triglycerides: 162 mg/dL — ABNORMAL HIGH (ref <150)

## 2018-01-21 LAB — HEMOGLOBIN A1C
HEMOGLOBIN A1C: 6.3 %{Hb} — AB (ref <5.7)
MEAN PLASMA GLUCOSE: 134 (calc)
eAG (mmol/L): 7.4 (calc)

## 2018-01-21 LAB — HIV ANTIBODY (ROUTINE TESTING W REFLEX): HIV: NONREACTIVE

## 2018-01-27 ENCOUNTER — Encounter: Payer: Self-pay | Admitting: Internal Medicine

## 2018-01-27 ENCOUNTER — Other Ambulatory Visit: Payer: Self-pay

## 2018-01-27 DIAGNOSIS — R0789 Other chest pain: Secondary | ICD-10-CM

## 2018-01-27 NOTE — Progress Notes (Signed)
Reordered cardiac CT per Pt.

## 2018-01-29 ENCOUNTER — Ambulatory Visit: Payer: BLUE CROSS/BLUE SHIELD | Admitting: Internal Medicine

## 2018-02-16 ENCOUNTER — Other Ambulatory Visit: Payer: Self-pay | Admitting: Nurse Practitioner

## 2018-02-19 ENCOUNTER — Other Ambulatory Visit: Payer: Self-pay | Admitting: Nurse Practitioner

## 2018-02-21 ENCOUNTER — Ambulatory Visit: Payer: BLUE CROSS/BLUE SHIELD | Admitting: Family Medicine

## 2018-02-25 ENCOUNTER — Ambulatory Visit: Payer: BLUE CROSS/BLUE SHIELD | Admitting: Family Medicine

## 2018-02-28 ENCOUNTER — Encounter (HOSPITAL_COMMUNITY): Payer: Self-pay

## 2018-02-28 ENCOUNTER — Ambulatory Visit (HOSPITAL_COMMUNITY)
Admission: RE | Admit: 2018-02-28 | Discharge: 2018-02-28 | Disposition: A | Discharge status: Home / Self Care | Payer: BLUE CROSS/BLUE SHIELD | Source: Ambulatory Visit | Attending: Internal Medicine | Admitting: Internal Medicine

## 2018-02-28 ENCOUNTER — Ambulatory Visit (HOSPITAL_COMMUNITY): Payer: BLUE CROSS/BLUE SHIELD

## 2018-02-28 DIAGNOSIS — R0789 Other chest pain: Secondary | ICD-10-CM | POA: Insufficient documentation

## 2018-02-28 MED ORDER — METOPROLOL TARTRATE 5 MG/5ML IV SOLN
5.0000 mg | INTRAVENOUS | Status: DC | PRN
  Administered 2018-02-28 (×2): 5 mg via INTRAVENOUS

## 2018-02-28 MED ORDER — METOPROLOL TARTRATE 5 MG/5ML IV SOLN
INTRAVENOUS | Status: AC
  Administered 2018-02-28: 5 mg via INTRAVENOUS
  Filled 2018-02-28: qty 20

## 2018-02-28 MED ORDER — IOPAMIDOL (ISOVUE-370) INJECTION 76%
100.0000 mL | Freq: Once | INTRAVENOUS | Status: AC | PRN
  Administered 2018-02-28: 100 mL via INTRAVENOUS

## 2018-02-28 MED ORDER — SODIUM CHLORIDE 0.9 % IV BOLUS
500.0000 mL | Freq: Once | INTRAVENOUS | Status: AC
  Administered 2018-02-28: 500 mL via INTRAVENOUS

## 2018-02-28 MED ORDER — NITROGLYCERIN 0.4 MG SL SUBL
SUBLINGUAL_TABLET | SUBLINGUAL | Status: AC
  Administered 2018-02-28: 0.8 mg via SUBLINGUAL
  Filled 2018-02-28: qty 2

## 2018-02-28 MED ORDER — NITROGLYCERIN 0.4 MG SL SUBL
0.8000 mg | SUBLINGUAL_TABLET | SUBLINGUAL | Status: DC | PRN
  Administered 2018-02-28: 0.8 mg via SUBLINGUAL

## 2018-03-01 DIAGNOSIS — R0789 Other chest pain: Secondary | ICD-10-CM

## 2018-03-04 ENCOUNTER — Encounter: Payer: Self-pay | Admitting: Family Medicine

## 2018-03-04 ENCOUNTER — Ambulatory Visit: Payer: BLUE CROSS/BLUE SHIELD | Admitting: Family Medicine

## 2018-03-04 VITALS — BP 124/80 | HR 95 | Temp 98.5°F | Resp 16 | Ht 65.0 in | Wt 225.3 lb

## 2018-03-04 DIAGNOSIS — Z23 Encounter for immunization: Secondary | ICD-10-CM

## 2018-03-04 DIAGNOSIS — F411 Generalized anxiety disorder: Secondary | ICD-10-CM

## 2018-03-04 DIAGNOSIS — E119 Type 2 diabetes mellitus without complications: Secondary | ICD-10-CM

## 2018-03-04 DIAGNOSIS — F330 Major depressive disorder, recurrent, mild: Secondary | ICD-10-CM

## 2018-03-04 DIAGNOSIS — Z9189 Other specified personal risk factors, not elsewhere classified: Secondary | ICD-10-CM | POA: Diagnosis not present

## 2018-03-04 DIAGNOSIS — E782 Mixed hyperlipidemia: Secondary | ICD-10-CM | POA: Diagnosis not present

## 2018-03-04 DIAGNOSIS — I10 Essential (primary) hypertension: Secondary | ICD-10-CM

## 2018-03-04 DIAGNOSIS — E041 Nontoxic single thyroid nodule: Secondary | ICD-10-CM

## 2018-03-04 DIAGNOSIS — F902 Attention-deficit hyperactivity disorder, combined type: Secondary | ICD-10-CM

## 2018-03-04 MED ORDER — OLMESARTAN MEDOXOMIL 5 MG PO TABS: ORAL_TABLET | ORAL | 0 refills | Status: DC

## 2018-03-04 MED ORDER — SEMAGLUTIDE(0.25 OR 0.5MG/DOS) 2 MG/1.5ML ~~LOC~~ SOPN: 0.5000 mg | PEN_INJECTOR | SUBCUTANEOUS | 2 refills | Status: DC

## 2018-03-04 MED ORDER — METFORMIN HCL 1000 MG PO TABS: 1000.0000 mg | ORAL_TABLET | Freq: Every day | ORAL | 0 refills | Status: DC

## 2018-03-04 MED ORDER — ATORVASTATIN CALCIUM 20 MG PO TABS: 20.0000 mg | ORAL_TABLET | Freq: Every day | ORAL | 0 refills | Status: DC

## 2018-03-04 NOTE — Progress Notes (Signed)
Name: Melanie Villarreal   MRN: [id number]    DOB: 16-May-1976   Date:03/04/2018       Progress Note  Subjective  Chief Complaint  Chief Complaint  Patient presents with  . Follow-up  . Medication Refill    HPI  Diabetes: Last A1C was 61month ago and was 6.3%, we will not check today. Taking ozempic - taking it at night to help with SE's (would get tired the first day). Diabetes mellitus type 2 Checking sugars?  yes How often? Daily Range (low to high) over last two weeks:  111-117 Does patient feel additional teaching/training would be helpful?  No  Have they attended Diabetes education classes? yes  Trying to limit white bread, white rice, white potatoes, sweets?  yes Trying to limit sweetened drinks like iced tea, soft drinks, sports drinks, fruit juices?  yes Checking feet every day/night?  yes Last eye exam:  07/22/2017 Denies: Polyuria, polydipsia, polyphagia, vision changes, or neuropathy.  Most recent A1C:  Lab Results  Component Value Date   HGBA1C 6.3 (H) 01/20/2018   Urine Micro UTD? Yes - needs repeat after October Current Medication Management: Ozempic and Metformin 1000mg  once daily Diabetic Medications:  ACEI/ARB: Yes Statin: Yes Aspirin therapy: Yes  HTN: Taking olmesartan, metoprolol; at goal today; compliant with medications (see discussion below regarding metoprolol).  Denies lightheadedness, dizziness, chest pain, shortness of breath, BLE edema, vision changes; has chronic intermittent headaches.    Obesity: Down 4lbs since last visit, she has been continuously losing weight at a steady rate.  Trying to eat healthy; working out regularly - doing boot camp weekly, but has not been in a week or two due to her Grandmother being in Hospice.  Thyroid Nodule: Saw Dr. Gabriel Carina and was told the nodule is okay to watch and wait - will monitor once a year with Dr. Gabriel Carina.  Chest Pain and Sinus Tachycardia and HLD: Saw Dr. Saunders Revel, was told to only follow up as needed as her  Cardiac CTA was clear. She denies chest pain, palpitations, or shortness of breath.  Last lipids were 01/20/18 and showed elevated triglycerides and HDL was low - discussed lifestyle changes to help.  She is taking meoprolol 25mg  1-2 times a day (She takes it twice daily 2 days a week and the rest of the days she takes it once daily).  Taking atorvastatin, olmesartan, and metoprolol.  ADHD: She would like to come off of her Lorazepam and wants to have her Vyvanse prescribed in our office instead.  She sees psychiatry for ADHD, GAD, and Depression.  She has next appointment on 03/20/2018 and will discuss transferring her care to our office at this visit.   GAD and Depression: She is taking Lorazepam 1mg  daily and would like to come off of this medication.  She is doing well on Zoloft and would like to take PRN hydroxyzine instead or lorazepam. She has been seeing psychiatry, but would like to transfer her psychiatric care to our office once she is off of benzos.  She is seeing her psychiatrist 03/20/2018 and will discuss with her then.  We will request records after the next appointment and call to confirm transfer of care.   Patient Active Problem List   Diagnosis Date Noted  . Family history of premature CAD 09/11/2017  . Thyroid nodule 08/15/2017  . LVH (left ventricular hypertrophy) 02/01/2017  . Elevated serum glutamic pyruvic transaminase (SGPT) level 07/24/2016  . Morbid obesity ([place]) 03/23/2016  . Sinus tachycardia  02/24/2016  . Valvular regurgitation 01/11/2016  . Chronic prescription benzodiazepine use 12/30/2015  . ADHD (attention deficit hyperactivity disorder), combined type 08/30/2015  . Cough due to ACE inhibitor 03/14/2015  . Generalized anxiety disorder 12/31/2014  . Major depressive disorder, recurrent episode ([place]) 10/25/2014  . Diabetes mellitus type 2, controlled, without complications ([place]) 32/35/5732  . HLD (hyperlipidemia) 10/25/2014  . Essential hypertension 10/25/2014     Past Surgical History:  Procedure Laterality Date  . CESAREAN SECTION    . thyroid nodule biopsy    . TUBAL LIGATION      Family History  Problem Relation Age of Onset  . Heart disease Mother   . Hyperlipidemia Mother   . Hypertension Mother   . Miscarriages / Stillbirths Mother        2  . Anxiety disorder Mother   . Coronary artery disease Mother 31       11 stents  . Alcohol abuse Father   . Cancer Father        testicular  . Depression Father   . Heart disease Sister   . Hyperlipidemia Sister   . Hypertension Sister   . Drug abuse Brother   . Alcohol abuse Brother   . Depression Brother   . ADD / ADHD Son   . Mental illness Maternal Aunt   . Alcohol abuse Maternal Grandfather   . Depression Maternal Grandfather   . Anxiety disorder Maternal Grandfather   . Cancer Paternal Grandmother   . Alcohol abuse Paternal Grandfather     Social History   Socioeconomic History  . Marital status: Married    Spouse name: Erlene Quan  . Number of children: 3  . Years of education: 2 Masters  . Highest education level: Master's degree (e.g., MA, MS, MEng, MEd, MSW, MBA)  Occupational History  . Not on file  Social Needs  . Financial resource strain: Not very hard  . Food insecurity:    Worry: Patient refused    Inability: Patient refused  . Transportation needs:    Medical: Patient refused    Non-medical: Patient refused  Tobacco Use  . Smoking status: Former Smoker    Types: Cigarettes, E-cigarettes    Last attempt to quit: 06/29/2017    Years since quitting: 0.6  . Smokeless tobacco: Never Used  . Tobacco comment: On and off since 7 yrs of age; now using e-cigarettes  Substance and Sexual Activity  . Alcohol use: Yes    Alcohol/week: 0.0 standard drinks    Frequency: Never    Comment: rare  . Drug use: No  . Sexual activity: Yes    Partners: Male  Lifestyle  . Physical activity:    Days per week: 3 days    Minutes per session: 60 min  . Stress: Only a  little  Relationships  . Social connections:    Talks on phone: More than three times a week    Gets together: Once a week    Attends religious service: More than 4 times per year    Active member of club or organization: Yes    Attends meetings of clubs or organizations: 1 to 4 times per year    Relationship status: Married  . Intimate partner violence:    Fear of current or ex partner: No    Emotionally abused: No    Physically abused: No    Forced sexual activity: No  Other Topics Concern  . Not on file  Social History Narrative  .  Not on file     Current Outpatient Medications:  .  aspirin EC 81 MG tablet, Take 1 tablet (81 mg total) by mouth daily., Disp: , Rfl:  .  atorvastatin (LIPITOR) 20 MG tablet, TAKE 1 TABLET(20 MG) BY MOUTH AT BEDTIME, Disp: 30 tablet, Rfl: 0 .  LORazepam (ATIVAN) 0.5 MG tablet, Take 1 mg by mouth daily. , Disp: , Rfl: 0 .  metFORMIN (GLUCOPHAGE) 1000 MG tablet, Take 1 tablet (1,000 mg total) by mouth 2 (two) times daily with a meal., Disp: 60 tablet, Rfl: 0 .  metoprolol tartrate (LOPRESSOR) 25 MG tablet, Take 1 tablet (25 mg total) by mouth 2 (two) times daily., Disp: 180 tablet, Rfl: 1 .  olmesartan (BENICAR) 5 MG tablet, TAKE 1 TABLET(5 MG) BY MOUTH DAILY, Disp: 30 tablet, Rfl: 0 .  Semaglutide (OZEMPIC) 0.25 or 0.5 MG/DOSE SOPN, Inject 0.5 mg into the skin once a week., Disp: 4 pen, Rfl: 2 .  sertraline (ZOLOFT) 100 MG tablet, Take 1 tablet (100 mg total) by mouth daily., Disp: 1 tablet, Rfl: 0 .  VYVANSE 50 MG capsule, Take 50 mg by mouth daily., Disp: , Rfl: 0  Allergies  Allergen Reactions  . Penicillins Anaphylaxis  . Latex Hives and Rash    ROS Constitutional: Negative for fever or weight change.  Respiratory: Negative for cough and shortness of breath.   Cardiovascular: Negative for chest pain or palpitations.  Gastrointestinal: Negative for abdominal pain, no bowel changes.  Musculoskeletal: Negative for gait problem or joint  swelling.  Skin: Negative for rash.  Neurological: Negative for dizziness; endorses occasional headache.  No other specific complaints in a complete review of systems (except as listed in HPI above).  Objective  Vitals:   03/04/18 0916  BP: 124/80  Pulse: 95  Resp: 16  Temp: 98.5 F (36.9 C)  TempSrc: Oral  SpO2: 98%  Weight: 225 lb 4.8 oz (102.2 kg)  Height: 5\' 5"  (1.651 m)   Body mass index is 37.49 kg/m.  Physical Exam Constitutional: Patient appears well-developed and well-nourished. No distress.  HENT: Head: Normocephalic and atraumatic. Nose: Nose normal Eyes: Conjunctivae and EOM are normal. Pupils are equal, round, and reactive to light. No scleral icterus.  Neck: Normal range of motion. Neck supple. No JVD present. Thyromegaly present and asymmetric with LEFT larger than the right, left sided nodule palpable. Cardiovascular: Normal rate, regular rhythm and normal heart sounds.  No murmur heard. No BLE edema. Pulmonary/Chest: Effort normal and breath sounds normal. No respiratory distress. Abdominal: Soft. Bowel sounds are normal, no distension. There is no tenderness. no masses Musculoskeletal: Normal range of motion, no joint effusions. No gross deformities Neurological: she is alert and oriented to person, place, and time. No cranial nerve deficit. Coordination, balance, strength, speech and gait are normal.  Skin: Skin is warm and dry. No rash noted. No erythema.  Psychiatric: Patient has a normal mood and affect. behavior is normal. Judgment and thought content normal.  No results found for this or any previous visit (from the past 72 hour(s)).  PHQ2/9: Depression screen [facility] 2/9 03/04/2018 01/20/2018 01/20/2018 08/07/2017 02/01/2017  Decreased Interest 0 0 0 0 0  Down, Depressed, Hopeless 0 0 0 0 0  PHQ - 2 Score 0 0 0 0 0  Altered sleeping 0 0 - - -  Tired, decreased energy 0 0 - - -  Change in appetite 0 0 - - -  Feeling bad or failure about yourself  0 0 - - -  Trouble concentrating 0 1 - - -  Moving slowly or fidgety/restless 0 0 - - -  Suicidal thoughts 0 0 - - -  PHQ-9 Score 0 1 - - -  Difficult doing work/chores Not difficult at all Somewhat difficult - - -   Fall Risk: Fall Risk  01/20/2018 08/07/2017 02/01/2017 01/04/2017 07/24/2016  Falls in the past year? No No No No No  Number falls in past yr: - - - - -  Injury with Fall? - - - - -   Assessment & Plan  1. Controlled type 2 diabetes mellitus without complication, without long-term current use of insulin ([place]) - Not due for A1C today, will check at next visit. - Semaglutide (OZEMPIC) 0.25 or 0.5 MG/DOSE SOPN; Inject 0.5 mg into the skin once a week.  Dispense: 4 pen; Refill: 2 - metFORMIN (GLUCOPHAGE) 1000 MG tablet; Take 1 tablet (1,000 mg total) by mouth daily with breakfast.  Dispense: 90 tablet; Refill: 0 - olmesartan (BENICAR) 5 MG tablet; TAKE 1 TABLET(5 MG) BY MOUTH DAILY  Dispense: 90 tablet; Refill: 0 - atorvastatin (LIPITOR) 20 MG tablet; Take 1 tablet (20 mg total) by mouth at bedtime.  Dispense: 90 tablet; Refill: 0  2. Morbid obesity ([place]) - Continuing to lose weight, doing well; discussed some nutritional improvements that could be made and encouraged returning to her exercise routine. - Semaglutide (OZEMPIC) 0.25 or 0.5 MG/DOSE SOPN; Inject 0.5 mg into the skin once a week.  Dispense: 4 pen; Refill: 2 - metFORMIN (GLUCOPHAGE) 1000 MG tablet; Take 1 tablet (1,000 mg total) by mouth daily with breakfast.  Dispense: 90 tablet; Refill: 0  3. Mixed hyperlipidemia - Discussed importance of 150 minutes of physical activity weekly, eat two servings of fish weekly, eat one serving of tree nuts ( cashews, pistachios, pecans, almonds.Marland Kitchen) every other day, eat 6 servings of fruit/vegetables daily and drink plenty of water and avoid sweet beverages.  - atorvastatin (LIPITOR) 20 MG tablet; Take 1 tablet (20 mg total) by mouth at bedtime.  Dispense: 90 tablet; Refill: 0  4. Essential  hypertension - Low sodium diet recommended. At goal today - olmesartan (BENICAR) 5 MG tablet; TAKE 1 TABLET(5 MG) BY MOUTH DAILY  Dispense: 90 tablet; Refill: 0  5. ADHD (attention deficit hyperactivity disorder), combined type 6. Generalized anxiety disorder 7. Mild episode of recurrent major depressive disorder (HCC) - Pt is aware that our clinic does not prescribe benzodiazepines.  She has been taking lorazepam 1mg  daily for well over a year at this point, and I do recommend she speak with her psychiatrist prior to weaning herself off.  I advised that I will be able to take over her zoloft and vyvanse prescriptions with the permission of her psychiatrist.  Ms. Budzik will speak with her psychiatrist at her next follow up and have her records faxed here.  I advised that if for any reason the psychiatrist does not feel she can be managed in primary care, then I will defer to the psychiatrists judgement. She verbalizes understanding.  8. Thyroid nodule - Stable, keep follow up with endocrinology.  9. Pneumococcal vaccination indicated - Pneumococcal polysaccharide vaccine 23-valent greater than or equal to 2yo subcutaneous/IM  10. Needs flu shot - Flu Vaccine QUAD 6+ mos PF IM (Fluarix Quad PF)

## 2018-03-05 ENCOUNTER — Ambulatory Visit: Payer: BLUE CROSS/BLUE SHIELD | Admitting: Internal Medicine

## 2018-04-01 ENCOUNTER — Encounter: Payer: Self-pay | Admitting: *Deleted

## 2018-04-23 ENCOUNTER — Ambulatory Visit: Payer: BLUE CROSS/BLUE SHIELD | Admitting: Family Medicine

## 2018-04-29 ENCOUNTER — Ambulatory Visit: Payer: BLUE CROSS/BLUE SHIELD | Admitting: Family Medicine

## 2018-05-13 ENCOUNTER — Ambulatory Visit: Payer: BLUE CROSS/BLUE SHIELD | Admitting: Family Medicine

## 2018-05-13 ENCOUNTER — Encounter: Payer: Self-pay | Admitting: Family Medicine

## 2018-05-13 VITALS — BP 110/80 | HR 100 | Temp 98.5°F | Resp 16 | Ht 65.0 in | Wt 236.6 lb

## 2018-05-13 DIAGNOSIS — I38 Endocarditis, valve unspecified: Secondary | ICD-10-CM

## 2018-05-13 DIAGNOSIS — R55 Syncope and collapse: Secondary | ICD-10-CM

## 2018-05-13 DIAGNOSIS — F330 Major depressive disorder, recurrent, mild: Secondary | ICD-10-CM

## 2018-05-13 DIAGNOSIS — F430 Acute stress reaction: Secondary | ICD-10-CM | POA: Diagnosis not present

## 2018-05-13 DIAGNOSIS — R295 Transient paralysis: Secondary | ICD-10-CM

## 2018-05-13 DIAGNOSIS — I517 Cardiomegaly: Secondary | ICD-10-CM

## 2018-05-13 DIAGNOSIS — F902 Attention-deficit hyperactivity disorder, combined type: Secondary | ICD-10-CM

## 2018-05-13 DIAGNOSIS — F411 Generalized anxiety disorder: Secondary | ICD-10-CM

## 2018-05-13 DIAGNOSIS — R000 Tachycardia, unspecified: Secondary | ICD-10-CM

## 2018-05-13 DIAGNOSIS — I10 Essential (primary) hypertension: Secondary | ICD-10-CM

## 2018-05-13 DIAGNOSIS — E119 Type 2 diabetes mellitus without complications: Secondary | ICD-10-CM

## 2018-05-13 DIAGNOSIS — E041 Nontoxic single thyroid nodule: Secondary | ICD-10-CM

## 2018-05-13 NOTE — Progress Notes (Signed)
Name: Melanie Villarreal   MRN: [id number]    DOB: Apr 04, 1976   Date:05/13/2018       Progress Note  Subjective  Chief Complaint  Chief Complaint  Patient presents with  . Follow-up      HPI   Diabetes mellitus type 2 Checking sugars?  yes How often? Only when she thinks they're abnormal - 1x/week Range (low to high) over last two weeks:  165 post-prandial; 70's when it's low - feels bad when this happens and will eat something - this happens very rarely. Does patient feel additional teaching/training would be helpful?  no  Have they attended Diabetes education classes? yes  Trying to limit white bread, white rice, white potatoes, sweets?  no, but doesn't eat much of it. Trying to limit sweetened drinks like iced tea, soft drinks, sports drinks, fruit juices?  Very occasional sweet tea Checking feet every day/night?  yes Last eye exam:  Has one coming up in the next month or so. Denies: Polyuria, polydipsia, polyphagia, vision changes, or neuropathy. Most recent A1C:  Lab Results  Component Value Date   HGBA1C 6.3 (H) 01/20/2018    We will recheck today. Last CMP Results : is due for repeat today Urine Micro UTD? No Current Medication Management: Ozempic 0.5mg ; not taking metformin due to GI upset.  Diabetic Medications:  ACEI/ARB: Yes Statin: Yes Aspirin therapy: Yes  HTN: Taking olmesartan, metoprolol; at goal today; compliant with medications. Denies lightheadedness, dizziness, chest pain, shortness of breath, BLE edema, vision changes; has chronic intermittent headaches.  She does not follow a low sodium diet.  Obesity: Trying to eat healthy; not currently working out due to multiple life events. Discussed lifestyle modifications. On Ozempic, but did miss 1 week of it.  Stopped Metformin, has been under a lot of stress lately - she is up 11lbs.  Thyroid Nodule: Pt reports she has been "cleared" by endocrinology after negative biopsy, reports she will have one follow up  with them next year (Due for this in February 2019).  FNA of thyroid nodule was benign in February 2019.  We will refer to Dr. Gabriel Carina as no notes are found stating follow up timeframe or ongoing follow up.  Acute Stress/?syncopal episode: Pt in today discussing several significant family events and deaths in the last month Pt discussing multiple events throughout her life where she had either syncopal events or transient paralysis, events occurred when she was 74, 47, and 55. Most recent event occurred two days ago, she describes it as feeling like she cannot move or talk, felt dizzy, then projectile vomited. This lasted approximately 3 minutes. Pt states she was able to talk to her husband through the event but could not move either arms or legs, movement returned to her right arm before her left. Denies pain during event, unsure of vision changes during event due to baseline vision, describes tunnel vision during event.  Chest Pain and Sinus Tachycardia and HLD and LVH/Valvular Regurgitation: Saw Dr. Saunders Revel, was told to only follow up as needed as her Cardiac CTA was clear. She denies chest pain, palpitations, or shortness of breath.  Last lipids were 01/20/18 and showed elevated triglycerides and HDL was low - again discussed lifestyle changes to help.  She is taking meoprolol 25mg  1-2 times a day (She takes it twice daily 2 days a week and the rest of the days she takes it once daily).  Taking atorvastatin, olmesartan, and metoprolol. Unchanged from last visit, stable.  ADHD:  She would like to come off of her Lorazepam and wants to have her Vyvanse prescribed in our office instead.  She sees psychiatry for ADHD, GAD, and Depression.  She is going to continue to see psychiatry for the time being as she is still taking lorazepam.   GAD and Depression: She is taking Lorazepam 1mg  daily and would like to come off of this medication.  She is doing well on Zoloft and would like to take PRN hydroxyzine instead or  lorazepam. She has been seeing psychiatry, but would like to transfer her psychiatric care to our office once she is off of benzos.  She is going to continue to see psychiatry for the time being as she is still taking lorazepam.   Patient Active Problem List   Diagnosis Date Noted  . Family history of premature CAD 09/11/2017  . Thyroid nodule 08/15/2017  . LVH (left ventricular hypertrophy) 02/01/2017  . Elevated serum glutamic pyruvic transaminase (SGPT) level 07/24/2016  . Morbid obesity ([place]) 03/23/2016  . Sinus tachycardia 02/24/2016  . Valvular regurgitation 01/11/2016  . Chronic prescription benzodiazepine use 12/30/2015  . ADHD (attention deficit hyperactivity disorder), combined type 08/30/2015  . Generalized anxiety disorder 12/31/2014  . Major depressive disorder, recurrent episode ([place]) 10/25/2014  . Diabetes mellitus type 2, controlled, without complications ([place]) 97/67/3419  . Essential hypertension 10/25/2014    Past Surgical History:  Procedure Laterality Date  . CESAREAN SECTION    . thyroid nodule biopsy    . TUBAL LIGATION      Family History  Problem Relation Age of Onset  . Heart disease Mother   . Hyperlipidemia Mother   . Hypertension Mother   . Miscarriages / Stillbirths Mother        2  . Anxiety disorder Mother   . Coronary artery disease Mother 27       11 stents  . Alcohol abuse Father   . Cancer Father        testicular  . Depression Father   . Heart disease Sister   . Hyperlipidemia Sister   . Hypertension Sister   . Drug abuse Brother   . Alcohol abuse Brother   . Depression Brother   . ADD / ADHD Son   . Mental illness Maternal Aunt   . Alcohol abuse Maternal Grandfather   . Depression Maternal Grandfather   . Anxiety disorder Maternal Grandfather   . Cancer Paternal Grandmother   . Alcohol abuse Paternal Grandfather     Social History   Socioeconomic History  . Marital status: Married    Spouse name: Erlene Quan  . Number of  children: 3  . Years of education: 2 Masters  . Highest education level: Master's degree (e.g., MA, MS, MEng, MEd, MSW, MBA)  Occupational History  . Not on file  Social Needs  . Financial resource strain: Not very hard  . Food insecurity:    Worry: Patient refused    Inability: Patient refused  . Transportation needs:    Medical: Patient refused    Non-medical: Patient refused  Tobacco Use  . Smoking status: Former Smoker    Types: Cigarettes, E-cigarettes    Last attempt to quit: 06/29/2017    Years since quitting: 0.8  . Smokeless tobacco: Never Used  . Tobacco comment: On and off since 89 yrs of age; now using e-cigarettes  Substance and Sexual Activity  . Alcohol use: Yes    Alcohol/week: 0.0 standard drinks    Frequency: Never  Comment: rare  . Drug use: No  . Sexual activity: Yes    Partners: Male  Lifestyle  . Physical activity:    Days per week: 3 days    Minutes per session: 60 min  . Stress: Only a little  Relationships  . Social connections:    Talks on phone: More than three times a week    Gets together: Once a week    Attends religious service: More than 4 times per year    Active member of club or organization: Yes    Attends meetings of clubs or organizations: 1 to 4 times per year    Relationship status: Married  . Intimate partner violence:    Fear of current or ex partner: No    Emotionally abused: No    Physically abused: No    Forced sexual activity: No  Other Topics Concern  . Not on file  Social History Narrative  . Not on file     Current Outpatient Medications:  .  aspirin EC 81 MG tablet, Take 1 tablet (81 mg total) by mouth daily., Disp: , Rfl:  .  atorvastatin (LIPITOR) 20 MG tablet, Take 1 tablet (20 mg total) by mouth at bedtime., Disp: 90 tablet, Rfl: 0 .  LORazepam (ATIVAN) 0.5 MG tablet, Take 1 mg by mouth daily. , Disp: , Rfl: 0 .  olmesartan (BENICAR) 5 MG tablet, TAKE 1 TABLET(5 MG) BY MOUTH DAILY, Disp: 90 tablet, Rfl:  0 .  Semaglutide (OZEMPIC) 0.25 or 0.5 MG/DOSE SOPN, Inject 0.5 mg into the skin once a week., Disp: 4 pen, Rfl: 2 .  Vilazodone HCl (VIIBRYD) 40 MG TABS, , Disp: , Rfl:  .  VYVANSE 50 MG capsule, Take 50 mg by mouth daily., Disp: , Rfl: 0 .  metFORMIN (GLUCOPHAGE) 1000 MG tablet, Take 1 tablet (1,000 mg total) by mouth daily with breakfast. (Patient not taking: Reported on 05/13/2018), Disp: 90 tablet, Rfl: 0 .  metoprolol tartrate (LOPRESSOR) 25 MG tablet, Take 1 tablet (25 mg total) by mouth 2 (two) times daily., Disp: 180 tablet, Rfl: 1 .  sertraline (ZOLOFT) 100 MG tablet, Take 1 tablet (100 mg total) by mouth daily., Disp: 1 tablet, Rfl: 0  Allergies  Allergen Reactions  . Penicillins Anaphylaxis  . Latex Hives and Rash    I personally reviewed active problem list, medication list, allergies, family history, social history, health maintenance, notes from last encounter, lab results with the patient/caregiver today.   ROS  Constitutional: Negative for fever or weight change.  Respiratory: Negative for cough and shortness of breath.   Cardiovascular: Negative for chest pain or palpitations.  Gastrointestinal: Negative for abdominal pain, no bowel changes.  Musculoskeletal: Negative for gait problem or joint swelling.  Skin: Negative for rash.  Neurological: See HPI No other specific complaints in a complete review of systems (except as listed in HPI above).  Objective  Vitals:   05/13/18 0907  BP: 110/80  Pulse: 100  Resp: 16  Temp: 98.5 F (36.9 C)  TempSrc: Oral  SpO2: 98%  Weight: 236 lb 9.6 oz (107.3 kg)  Height: 5\' 5"  (1.651 m)   Body mass index is 39.37 kg/m.  Physical Exam  Constitutional: Patient appears well-developed and well-nourished. No distress.  HENT: Head: Normocephalic and atraumatic.  Nose: Nose normal. Mouth/Throat: Oropharynx is clear and moist. No oropharyngeal exudate or tonsillar swelling.  Eyes: Conjunctivae and EOM are normal. No scleral  icterus.  Pupils are equal, round, and reactive to  light.  Neck: Normal range of motion. Neck supple. No JVD present. Goiter present, asymmetric right. Cardiovascular: Normal rate, regular rhythm and normal heart sounds.  No murmur heard. No BLE edema. Pulmonary/Chest: Effort normal and breath sounds normal. No respiratory distress. Abdominal: Soft. Bowel sounds are normal, no distension. There is no tenderness. No masses. Musculoskeletal: Normal range of motion, no joint effusions. No gross deformities Neurological: Pt is alert and oriented to person, place, and time. No cranial nerve deficit. Coordination, balance, strength, speech and gait are normal.  Skin: Skin is warm and dry. No rash noted. No erythema.  Psychiatric: Patient has a normal mood and affect. behavior is normal. Judgment and thought content normal.  No results found for this or any previous visit (from the past 72 hour(s)).  PHQ2/9: Depression screen [facility] 2/9 05/13/2018 03/04/2018 01/20/2018 01/20/2018 08/07/2017  Decreased Interest 0 0 0 0 0  Down, Depressed, Hopeless 0 0 0 0 0  PHQ - 2 Score 0 0 0 0 0  Altered sleeping 0 0 0 - -  Tired, decreased energy 0 0 0 - -  Change in appetite 0 0 0 - -  Feeling bad or failure about yourself  0 0 0 - -  Trouble concentrating 0 0 1 - -  Moving slowly or fidgety/restless 0 0 0 - -  Suicidal thoughts 0 0 0 - -  PHQ-9 Score 0 0 1 - -  Difficult doing work/chores Not difficult at all Not difficult at all Somewhat difficult - -   Fall Risk: Fall Risk  05/13/2018 01/20/2018 08/07/2017 02/01/2017 01/04/2017  Falls in the past year? No No No No No  Number falls in past yr: - - - - -  Injury with Fall? - - - - -   Assessment & Plan  1. Near syncope - We discussed possible neurologic issue vs possible conversion disorder for these episodes. Recommend she present for emergency care if another event occurs. - Thyroid Panel With TSH - COMPLETE METABOLIC PANEL WITH GFR - CBC w/Diff/Platelet -  Ambulatory referral to Neurology  2. Transient paralysis - We discussed possible neurologic issue vs possible conversion disorder for these episodes. Recommend she present for emergency care if another event occurs. - COMPLETE METABOLIC PANEL WITH GFR - CBC w/Diff/Platelet - Ambulatory referral to Neurology  3. Acute stress reaction - We discussed possible neurologic issue vs possible conversion disorder for these episodes. Recommend she present for emergency care if another event occurs. - Continue seeing psychiatry and counseling for anxiety and depression; she is experiencing grief over the loss of two grandparents in the last month. - Ambulatory referral to Neurology  4. Essential hypertension - Stable, continue medications. - Thyroid Panel With TSH - COMPLETE METABOLIC PANEL WITH GFR  5. LVH (left ventricular hypertrophy) - Stable, asymptomatic  6. Thyroid nodule - Thyroid Panel With TSH - Ambulatory referral to Endocrinology  7. Controlled type 2 diabetes mellitus without complication, without long-term current use of insulin ([place]) - Continue ozempic, she is off of Metformin due to GI upset. - Hemoglobin A1c - COMPLETE METABOLIC PANEL WITH GFR - Urine Microalbumin w/creat. ratio  8. Valvular regurgitation - Stable, asymptomatic  9. Morbid obesity ([place]) - Discussed importance of 150 minutes of physical activity weekly, eat two servings of fish weekly, eat one serving of tree nuts ( cashews, pistachios, pecans, almonds.Marland Kitchen) every other day, eat 6 servings of fruit/vegetables daily and drink plenty of water and avoid sweet beverages.  - Continue Ozempic -  Hemoglobin A1c - Thyroid Panel With TSH - COMPLETE METABOLIC PANEL WITH GFR - CBC w/Diff/Platelet  10. Sinus tachycardia - HR 100bpm today; stable  11. Generalized anxiety disorder - Keep follow up with psychiatry  12. Mild episode of recurrent major depressive disorder ([place]) - Keep follow up with psychiatry  13.  ADHD (attention deficit hyperactivity disorder), combined type - Keep follow up with psychiatry

## 2018-05-14 ENCOUNTER — Other Ambulatory Visit: Payer: Self-pay | Admitting: Family Medicine

## 2018-05-14 ENCOUNTER — Encounter: Payer: Self-pay | Admitting: Family Medicine

## 2018-05-14 DIAGNOSIS — I10 Essential (primary) hypertension: Secondary | ICD-10-CM

## 2018-05-14 DIAGNOSIS — E119 Type 2 diabetes mellitus without complications: Secondary | ICD-10-CM

## 2018-05-14 DIAGNOSIS — E782 Mixed hyperlipidemia: Secondary | ICD-10-CM

## 2018-05-14 LAB — COMPLETE METABOLIC PANEL WITH GFR
AG Ratio: 1.7 (calc) (ref 1.0–2.5)
ALKALINE PHOSPHATASE (APISO): 71 U/L (ref 33–115)
ALT: 28 U/L (ref 6–29)
AST: 19 U/L (ref 10–30)
Albumin: 4.3 g/dL (ref 3.6–5.1)
BILIRUBIN TOTAL: 0.9 mg/dL (ref 0.2–1.2)
BUN: 12 mg/dL (ref 7–25)
CHLORIDE: 103 mmol/L (ref 98–110)
CO2: 27 mmol/L (ref 20–32)
CREATININE: 0.75 mg/dL (ref 0.50–1.10)
Calcium: 9.2 mg/dL (ref 8.6–10.2)
GFR, Est African American: 116 mL/min/{1.73_m2} (ref >=60)
GFR, Est Non African American: 100 mL/min/{1.73_m2} (ref >=60)
GLUCOSE: 139 mg/dL (ref 65–139)
Globulin: 2.5 g/dL (calc) (ref 1.9–3.7)
Potassium: 4.3 mmol/L (ref 3.5–5.3)
SODIUM: 139 mmol/L (ref 135–146)
Total Protein: 6.8 g/dL (ref 6.1–8.1)

## 2018-05-14 LAB — CBC WITH DIFFERENTIAL/PLATELET
BASOS PCT: 0.4 %
Basophils Absolute: 34 cells/uL (ref 0–200)
EOS PCT: 1.1 %
Eosinophils Absolute: 92 cells/uL (ref 15–500)
HCT: 41 % (ref 35.0–45.0)
Hemoglobin: 14 g/dL (ref 11.7–15.5)
Lymphs Abs: 3234 cells/uL (ref 850–3900)
MCH: 28.5 pg (ref 27.0–33.0)
MCHC: 34.1 g/dL (ref 32.0–36.0)
MCV: 83.5 fL (ref 80.0–100.0)
MONOS PCT: 5.1 %
MPV: 11.1 fL (ref 7.5–12.5)
NEUTROS ABS: 4612 {cells}/uL (ref 1500–7800)
Neutrophils Relative %: 54.9 %
PLATELETS: 323 10*3/uL (ref 140–400)
RBC: 4.91 10*6/uL (ref 3.80–5.10)
RDW: 12.5 % (ref 11.0–15.0)
Total Lymphocyte: 38.5 %
WBC mixed population: 428 cells/uL (ref 200–950)
WBC: 8.4 10*3/uL (ref 3.8–10.8)

## 2018-05-14 LAB — THYROID PANEL WITH TSH
FREE THYROXINE INDEX: 2 (ref 1.4–3.8)
T3 UPTAKE: 31 % (ref 22–35)
T4 TOTAL: 6.4 ug/dL (ref 5.1–11.9)
TSH: 1.56 mIU/L

## 2018-05-14 LAB — MICROALBUMIN / CREATININE URINE RATIO
CREATININE, URINE: 185 mg/dL (ref 20–275)
MICROALB UR: 1.1 mg/dL
Microalb Creat Ratio: 6 mcg/mg creat (ref <30)

## 2018-05-14 LAB — HEMOGLOBIN A1C
Hgb A1c MFr Bld: 6.7 % of total Hgb — ABNORMAL HIGH (ref <5.7)
Mean Plasma Glucose: 146 (calc)
eAG (mmol/L): 8.1 (calc)

## 2018-05-19 ENCOUNTER — Encounter: Payer: Self-pay | Admitting: Family Medicine

## 2018-05-20 NOTE — Telephone Encounter (Signed)
Hi Melissa - could you look into this and update the patient? Thanks so much!

## 2018-06-07 ENCOUNTER — Encounter: Payer: Self-pay | Admitting: Family Medicine

## 2018-06-09 ENCOUNTER — Encounter: Payer: Self-pay | Admitting: Family Medicine

## 2018-06-10 ENCOUNTER — Other Ambulatory Visit: Payer: Self-pay | Admitting: Emergency Medicine

## 2018-06-17 ENCOUNTER — Encounter: Payer: Self-pay | Admitting: Family Medicine

## 2018-06-17 DIAGNOSIS — R51 Headache: Secondary | ICD-10-CM

## 2018-06-17 DIAGNOSIS — R519 Headache, unspecified: Secondary | ICD-10-CM | POA: Insufficient documentation

## 2018-06-17 DIAGNOSIS — R404 Transient alteration of awareness: Secondary | ICD-10-CM | POA: Insufficient documentation

## 2018-06-18 NOTE — Telephone Encounter (Signed)
Rescheduled patient for Dec. 12, 2019

## 2018-06-20 ENCOUNTER — Other Ambulatory Visit: Payer: Self-pay | Admitting: Family Medicine

## 2018-06-20 ENCOUNTER — Encounter: Payer: BLUE CROSS/BLUE SHIELD | Admitting: Family Medicine

## 2018-06-20 DIAGNOSIS — E119 Type 2 diabetes mellitus without complications: Secondary | ICD-10-CM

## 2018-06-20 DIAGNOSIS — I10 Essential (primary) hypertension: Secondary | ICD-10-CM

## 2018-06-20 MED ORDER — OLMESARTAN MEDOXOMIL 5 MG PO TABS: ORAL_TABLET | ORAL | 0 refills | Status: DC

## 2018-06-20 NOTE — Telephone Encounter (Signed)
Copied from [place] [id number]. Topic: Quick Communication - Rx Refill/Question >> Jun 20, 2018  9:56 AM Bea Graff, NT wrote: Medication: olmesartan (BENICAR) 5 MG tablet   Has the patient contacted their pharmacy? Yes.   (Agent: If no, request that the patient contact the pharmacy for the refill.) (Agent: If yes, when and what did the pharmacy advise?)  Preferred Pharmacy (with phone number or street name): [id number] - [address], [place] - [place] [phone] (Phone) [phone] (Fax)    Agent: Please be advised that RX refills may take up to 3 business days. We ask that you follow-up with your pharmacy.

## 2018-06-21 ENCOUNTER — Encounter: Payer: Self-pay | Admitting: Family Medicine

## 2018-06-26 ENCOUNTER — Encounter: Payer: BLUE CROSS/BLUE SHIELD | Admitting: Family Medicine

## 2018-07-14 DIAGNOSIS — R569 Unspecified convulsions: Secondary | ICD-10-CM | POA: Insufficient documentation

## 2018-07-15 LAB — HM DIABETES EYE EXAM

## 2018-07-18 ENCOUNTER — Other Ambulatory Visit: Payer: Self-pay | Admitting: Family Medicine

## 2018-07-18 DIAGNOSIS — E119 Type 2 diabetes mellitus without complications: Secondary | ICD-10-CM

## 2018-07-18 DIAGNOSIS — E782 Mixed hyperlipidemia: Secondary | ICD-10-CM

## 2018-08-13 ENCOUNTER — Ambulatory Visit: Payer: BLUE CROSS/BLUE SHIELD | Admitting: Family Medicine

## 2018-08-15 ENCOUNTER — Encounter: Payer: Self-pay | Admitting: Family Medicine

## 2018-08-15 ENCOUNTER — Ambulatory Visit (INDEPENDENT_AMBULATORY_CARE_PROVIDER_SITE_OTHER): Payer: BLUE CROSS/BLUE SHIELD | Admitting: Family Medicine

## 2018-08-15 VITALS — BP 128/84 | HR 100 | Temp 98.8°F | Resp 16 | Ht 65.0 in | Wt 230.4 lb

## 2018-08-15 DIAGNOSIS — R569 Unspecified convulsions: Secondary | ICD-10-CM

## 2018-08-15 DIAGNOSIS — I10 Essential (primary) hypertension: Secondary | ICD-10-CM | POA: Diagnosis not present

## 2018-08-15 DIAGNOSIS — N3941 Urge incontinence: Secondary | ICD-10-CM | POA: Insufficient documentation

## 2018-08-15 DIAGNOSIS — R51 Headache: Secondary | ICD-10-CM

## 2018-08-15 DIAGNOSIS — I517 Cardiomegaly: Secondary | ICD-10-CM

## 2018-08-15 DIAGNOSIS — I38 Endocarditis, valve unspecified: Secondary | ICD-10-CM

## 2018-08-15 DIAGNOSIS — E118 Type 2 diabetes mellitus with unspecified complications: Secondary | ICD-10-CM

## 2018-08-15 DIAGNOSIS — E041 Nontoxic single thyroid nodule: Secondary | ICD-10-CM

## 2018-08-15 DIAGNOSIS — R519 Headache, unspecified: Secondary | ICD-10-CM

## 2018-08-15 DIAGNOSIS — F330 Major depressive disorder, recurrent, mild: Secondary | ICD-10-CM

## 2018-08-15 DIAGNOSIS — F902 Attention-deficit hyperactivity disorder, combined type: Secondary | ICD-10-CM

## 2018-08-15 DIAGNOSIS — R000 Tachycardia, unspecified: Secondary | ICD-10-CM

## 2018-08-15 DIAGNOSIS — F411 Generalized anxiety disorder: Secondary | ICD-10-CM

## 2018-08-15 DIAGNOSIS — R404 Transient alteration of awareness: Secondary | ICD-10-CM

## 2018-08-15 LAB — POCT GLYCOSYLATED HEMOGLOBIN (HGB A1C): HbA1c, POC (controlled diabetic range): 6.6 % (ref 0.0–7.0)

## 2018-08-15 MED ORDER — OLMESARTAN MEDOXOMIL 5 MG PO TABS: ORAL_TABLET | ORAL | 0 refills | Status: DC

## 2018-08-15 MED ORDER — METFORMIN HCL ER 500 MG PO TB24: 500.0000 mg | ORAL_TABLET | Freq: Two times a day (BID) | ORAL | 1 refills | Status: DC

## 2018-08-15 NOTE — Assessment & Plan Note (Signed)
Continue seeing psychiatry; taking Vybrid now.  Stopped Zoloft. Continue weaning Lorazepam.

## 2018-08-15 NOTE — Assessment & Plan Note (Signed)
EEG scheduled for next week

## 2018-08-15 NOTE — Assessment & Plan Note (Signed)
DASH diet discussed, continue current medication regimen as she is at goal today.

## 2018-08-15 NOTE — Assessment & Plan Note (Signed)
Needs to attend scheduled referral to Dr. Gabriel Carina for follow up.

## 2018-08-15 NOTE — Assessment & Plan Note (Signed)
Stable, seeing Dr. Saunders Revel PRN only.

## 2018-08-15 NOTE — Assessment & Plan Note (Signed)
Taking Vyvanse, on Ozempic.  Discussed importance of 150 minutes of physical activity weekly, eat two servings of fish weekly, eat one serving of tree nuts ( cashews, pistachios, pecans, almonds.Marland Kitchen) every other day, eat 6 servings of fruit/vegetables daily and drink plenty of water and avoid sweet beverages.

## 2018-08-15 NOTE — Assessment & Plan Note (Signed)
Rate controlled today, continue metoprolol

## 2018-08-15 NOTE — Progress Notes (Signed)
Established Patient Office Visit  Subjective:  Patient ID: Melanie Villarreal, female    DOB: [birth year]  Age: 43 y.o. MRN: [id number]  CC:  Chief Complaint  Patient presents with  . Follow-up    3 month recheck    HPI Melanie Villarreal presents for follow up:  Diabetes mellitus type 2 Checking sugars?  yes How often? Only when not feeling well Range (low to high) over last two weeks:  91-189 Does patient feel additional teaching/training would be helpful?  no  Have they attended Diabetes education classes? yes  Trying to limit white bread, white rice, white potatoes, sweets?  no Trying to limit sweetened drinks like iced tea, soft drinks, sports drinks, fruit juices?  no - eating more sweets lately. Checking feet every day/night?  no Last eye exam:  Did have by Dr. Ellin Mayhew recently.  We will request records. Denies: Polyuria, polydipsia, polyphagia, vision changes, or neuropathy. Most recent A1C:  Lab Results  Component Value Date   HGBA1C 6.6 08/15/2018    We will recheck today. Last CMP Results : is not due for repeat today Urine Micro UTD? Yes Current Medication Management: Diabetic Medications:  ACEI/ARB: Yes Statin: Yes Aspirin therapy: Yes  HTN:  -does take medications as prescribed - current regimen includes olmesartan, metoprolol.  - taking medications as instructed, no medication side effects noted, no TIAs, no chest pain on exertion, no dyspnea on exertion, no swelling of ankles - DASH diet discussed - pt does not follow a low sodium diet; salt added to cooking and salt shaker not on table - The following  are contributing factors: stress, sedentary lifestyle, corticosteroid use, saturated fats in diet, sweets including black licorice, smoking, sleep apnea, decongestant use.  Obesity: Body mass index is 38.34 kg/m. Weight Management History: Diet: poor meal pattern and frequent dining out Exercise: not active Co-Morbid Conditions: diabetes mellitus and  dyslipidemias; 2 or more of these conditions combined with BMI >30 is considered morbid obesity; is this diagnosis appropriate and/or added to patient's problem list? Yes  Wearing CPAP now - received one from a family member - needs to see Dr. Ashby Dawes for adjustments, will give her this information from referral scoordinator/.  Thyroid Nodule: Pt reports she has been "cleared" by endocrinology after negative biopsy, reports she will have one follow up with them next year (Due for this in February 2019).  FNA of thyroid nodule was benign in February 2019.  Was referred to Dr. Gabriel Carina at last visit, but she has not gone yet.  She does have upcoming appointment.  Denies heat/cold intolerance, no hair/skin/nail changes, or palpitations.   Altered Awareness/Seizure-like activity/Headaches: Pt was referred and went to see Dr. Melrose Nakayama in December due to multiple episodes of reported altered awareness with unclear etiology. Dr. Melrose Nakayama ordered EEG, will consider brain MRI if symptoms worsen.  He started her on nortriptyline daily and maxalt 10mg  for headaches. PT states recent episode last week that lasted 24 minutes and her husband was there to observe her.  She has taken Maxalt only a few times, and it has been effective when needed. Has EEG scheduled for next week.  Chest Pain and Sinus Tachycardia and HLD and LVH/Valvular Regurgitation: Saw Dr. Saunders Revel, was told to only follow up as needed as her Cardiac CTA was clear. She denies chest pain, palpitations, or shortness of breath. Last lipids were 01/20/18 and showed elevated triglycerides and HDL was low - again discussed lifestyle changes to help. She is taking meoprolol  50mg  daily. times a day.Taking atorvastatin, olmesartan, and metoprolol. We will check lipids at next visit because she was out of atorvastatin for 5 weeks and only restarted a few weeks ago.  ADHD: She sees psychiatry for ADHD, GAD, and Depression. She did start Vybrid and this seems to be  working much better for her.  She is going to continue to see psychiatry for the time being as she is still taking lorazepam.   GAD and Depression: She is taking Lorazepam 1mg  daily and would like to come off of this medication. She is doing well on Zoloft and would like to take PRN hydroxyzine instead or lorazepam. She has been seeing psychiatry, but would like to transfer her psychiatric care to our office once she is off of benzos. She is going to continue to see psychiatry for the time being as she is still taking lorazepam.   Urge Incontinence: She has noticed this worsening over the last few months.  She has trouble making it to the bathroom - maybe once every 2 weeks. She has tried Dealer and this has not helped her. No abdominal pain, no abnormal vaginal bleeding, no hematuria, no dysuria, no bulging or mass in vaginal canal.   Past Medical History:  Diagnosis Date  . ADHD (attention deficit hyperactivity disorder), combined type   . Allergy   . Anxiety   . Depression, controlled   . Diabetes mellitus without complication ([place])   . Generalized anxiety disorder 12/31/2014  . HTN, goal below 140/90   . Hyperlipidemia   . Hypertension goal BP (blood pressure) < 130/80 10/25/2014  . LVH (left ventricular hypertrophy) 02/01/2017   No CHF, on echo 2016, Dr. Clayborn Bigness  . Morbid obesity ([place])   . Panic disorder with agoraphobia and moderate panic attacks 12/30/2015  . Thyroid nodule 08/15/2017    Past Surgical History:  Procedure Laterality Date  . CESAREAN SECTION    . thyroid nodule biopsy    . TUBAL LIGATION      Family History  Problem Relation Age of Onset  . Heart disease Mother   . Hyperlipidemia Mother   . Hypertension Mother   . Miscarriages / Stillbirths Mother        2  . Anxiety disorder Mother   . Coronary artery disease Mother 27       11 stents  . Alcohol abuse Father   . Cancer Father        testicular  . Depression Father   . Heart disease Sister   .  Hyperlipidemia Sister   . Hypertension Sister   . Drug abuse Brother   . Alcohol abuse Brother   . Depression Brother   . ADD / ADHD Son   . Mental illness Maternal Aunt   . Alcohol abuse Maternal Grandfather   . Depression Maternal Grandfather   . Anxiety disorder Maternal Grandfather   . Cancer Paternal Grandmother   . Alcohol abuse Paternal Grandfather     Social History   Socioeconomic History  . Marital status: Married    Spouse name: Erlene Quan  . Number of children: 3  . Years of education: 2 Masters  . Highest education level: Master's degree (e.g., MA, MS, MEng, MEd, MSW, MBA)  Occupational History  . Not on file  Social Needs  . Financial resource strain: Not very hard  . Food insecurity:    Worry: Patient refused    Inability: Patient refused  . Transportation needs:    Medical: Patient refused  Non-medical: Patient refused  Tobacco Use  . Smoking status: Former Smoker    Types: Cigarettes, E-cigarettes    Last attempt to quit: 06/29/2017    Years since quitting: 1.1  . Smokeless tobacco: Never Used  . Tobacco comment: On and off since 36 yrs of age; now using e-cigarettes  Substance and Sexual Activity  . Alcohol use: Yes    Alcohol/week: 0.0 standard drinks    Frequency: Never    Comment: rare  . Drug use: No  . Sexual activity: Yes    Partners: Male  Lifestyle  . Physical activity:    Days per week: 3 days    Minutes per session: 60 min  . Stress: Only a little  Relationships  . Social connections:    Talks on phone: More than three times a week    Gets together: Once a week    Attends religious service: More than 4 times per year    Active member of club or organization: Yes    Attends meetings of clubs or organizations: 1 to 4 times per year    Relationship status: Married  . Intimate partner violence:    Fear of current or ex partner: No    Emotionally abused: No    Physically abused: No    Forced sexual activity: No  Other Topics  Concern  . Not on file  Social History Narrative  . Not on file    Outpatient Medications Prior to Visit  Medication Sig Dispense Refill  . aspirin EC 81 MG tablet Take 1 tablet (81 mg total) by mouth daily.    Marland Kitchen atorvastatin (LIPITOR) 20 MG tablet TAKE 1 TABLET(20 MG) BY MOUTH AT BEDTIME 90 tablet 1  . LORazepam (ATIVAN) 0.5 MG tablet Take 1 mg by mouth daily.   0  . nortriptyline (PAMELOR) 10 MG capsule Take 1 pill at night for one week then increase to 2 pills at night    . Semaglutide (OZEMPIC) 0.25 or 0.5 MG/DOSE SOPN Inject 0.5 mg into the skin once a week. 4 pen 2  . Vilazodone HCl (VIIBRYD) 40 MG TABS     . VYVANSE 50 MG capsule Take 50 mg by mouth daily.  0  . metFORMIN (GLUCOPHAGE) 500 MG tablet Take 500 mg by mouth 2 (two) times daily with a meal.    . olmesartan (BENICAR) 5 MG tablet TAKE 1 TABLET(5 MG) BY MOUTH DAILY 90 tablet 0  . metoprolol tartrate (LOPRESSOR) 25 MG tablet Take 1 tablet (25 mg total) by mouth 2 (two) times daily. 180 tablet 1  . rizatriptan (MAXALT-MLT) 10 MG disintegrating tablet DISSOLVE 1 T PO ONCE PRF MIGRAINE. MAY TK A SECOND DOSE AFTER 2 H IF NEEDED    . sertraline (ZOLOFT) 100 MG tablet Take 1 tablet (100 mg total) by mouth daily. (Patient not taking: Reported on 08/15/2018) 1 tablet 0   No facility-administered medications prior to visit.     Allergies  Allergen Reactions  . Penicillins Anaphylaxis  . Latex Hives and Rash    ROS Review of Systems  Constitutional: Negative for activity change, appetite change, fatigue and unexpected weight change.  HENT: Negative.   Eyes: Negative for visual disturbance.  Respiratory: Negative for cough, chest tightness and wheezing.   Cardiovascular: Negative for chest pain, palpitations and leg swelling.  Gastrointestinal: Positive for diarrhea (with Metformin). Negative for abdominal pain, constipation, nausea and vomiting.  Endocrine: Negative for cold intolerance, heat intolerance, polydipsia,  polyphagia and polyuria.  Genitourinary:  Negative for dysuria and frequency.  Musculoskeletal: Negative for back pain, joint swelling and myalgias.  Skin: Negative for rash.  Neurological: Positive for headaches. Negative for weakness and light-headedness.  Psychiatric/Behavioral: Negative for self-injury and suicidal ideas. The patient is hyperactive. The patient is not nervous/anxious.       Objective:    Physical Exam  Constitutional: She is oriented to person, place, and time. She appears well-developed and well-nourished. No distress.  HENT:  Head: Normocephalic and atraumatic.  Right Ear: External ear normal.  Left Ear: External ear normal.  Nose: Nose normal.  Mouth/Throat: Oropharynx is clear and moist. No oropharyngeal exudate.  Eyes: Pupils are equal, round, and reactive to light. Conjunctivae and EOM are normal.  Neck: Normal range of motion. Neck supple. No JVD present. No thyromegaly present.  Cardiovascular: Normal rate and regular rhythm. Exam reveals no gallop and no friction rub.  No murmur heard. Pulmonary/Chest: Breath sounds normal. She has no wheezes. She has no rales. She exhibits no tenderness.  Musculoskeletal: Normal range of motion.        General: No tenderness or edema.  Lymphadenopathy:    She has no cervical adenopathy.  Neurological: She is alert and oriented to person, place, and time. No cranial nerve deficit.  Skin: Skin is warm and dry. No rash noted.  Psychiatric: She has a normal mood and affect. Her behavior is normal. Judgment and thought content normal.  Nursing note and vitals reviewed.   BP 128/84 (BP Location: Right Arm, Patient Position: Sitting, Cuff Size: Large)   Pulse 100   Temp 98.8 F (37.1 C) (Oral)   Resp 16   Ht 5\' 5"  (1.651 m)   Wt 230 lb 6.4 oz (104.5 kg)   SpO2 99%   BMI 38.34 kg/m  Wt Readings from Last 3 Encounters:  08/15/18 230 lb 6.4 oz (104.5 kg)  05/13/18 236 lb 9.6 oz (107.3 kg)  03/04/18 225 lb 4.8 oz  (102.2 kg)   Health Maintenance Due  Topic Date Due  . OPHTHALMOLOGY EXAM  07/22/2018  . FOOT EXAM  08/07/2018    There are no preventive care reminders to display for this patient.  Lab Results  Component Value Date   TSH 1.56 05/13/2018   Lab Results  Component Value Date   WBC 8.4 05/13/2018   HGB 14.0 05/13/2018   HCT 41.0 05/13/2018   MCV 83.5 05/13/2018   PLT 323 05/13/2018   Lab Results  Component Value Date   NA 139 05/13/2018   K 4.3 05/13/2018   CO2 27 05/13/2018   GLUCOSE 139 05/13/2018   BUN 12 05/13/2018   CREATININE 0.75 05/13/2018   BILITOT 0.9 05/13/2018   ALKPHOS 72 08/28/2016   AST 19 05/13/2018   ALT 28 05/13/2018   PROT 6.8 05/13/2018   ALBUMIN 4.6 08/28/2016   CALCIUM 9.2 05/13/2018   ANIONGAP 7 08/18/2014   Lab Results  Component Value Date   CHOL 160 01/20/2018   Lab Results  Component Value Date   HDL 39 (L) 01/20/2018   Lab Results  Component Value Date   LDLCALC 94 01/20/2018   Lab Results  Component Value Date   TRIG 162 (H) 01/20/2018   Lab Results  Component Value Date   CHOLHDL 4.1 01/20/2018   Lab Results  Component Value Date   HGBA1C 6.6 08/15/2018      Assessment & Plan:   Problem List Items Addressed This Visit      Cardiovascular and Mediastinum  Essential hypertension (Chronic)    DASH diet discussed, continue current medication regimen as she is at goal today.      Relevant Medications   olmesartan (BENICAR) 5 MG tablet   LVH (left ventricular hypertrophy)    Stable, seeing Dr. Saunders Revel PRN only.      Relevant Medications   olmesartan (BENICAR) 5 MG tablet     Endocrine   Diabetes mellitus type 2, controlled, without complications ([place]) - Primary (Chronic)    Will check A1C and adjust medications if needed.  Needs eye exam faxed to our office. Foot exam is normal. Change Metformin to XR to help with GI SE's.      Relevant Medications   olmesartan (BENICAR) 5 MG tablet   metFORMIN (GLUCOPHAGE  XR) 500 MG 24 hr tablet   Thyroid nodule    Needs to attend scheduled referral to Dr. Gabriel Carina for follow up.        Other   Major depressive disorder, recurrent episode ([place])    Continue seeing psychiatry; taking Vybrid now.  Stopped Zoloft. Continue weaning Lorazepam.      Relevant Medications   nortriptyline (PAMELOR) 10 MG capsule   Generalized anxiety disorder    Tapering down on Lorazepam with goal of coming off - continue to work with psychiatry.      Relevant Medications   nortriptyline (PAMELOR) 10 MG capsule   ADHD (attention deficit hyperactivity disorder), combined type    Taking vyvanse and doing well on this. Keep follow up with psychiatry.      Valvular regurgitation    Saw Dr. Saunders Revel in the past, was told to follow up only PRN.      Sinus tachycardia    Rate controlled today, continue metoprolol      Morbid obesity (HCC)    Taking Vyvanse, on Ozempic.  Discussed importance of 150 minutes of physical activity weekly, eat two servings of fish weekly, eat one serving of tree nuts ( cashews, pistachios, pecans, almonds.Marland Kitchen) every other day, eat 6 servings of fruit/vegetables daily and drink plenty of water and avoid sweet beverages.        Relevant Medications   metFORMIN (GLUCOPHAGE XR) 500 MG 24 hr tablet   Altered awareness, transient    Keep follow up with neurology, and has EEG scheduled. Continue current medication regimen      Seizure-like activity (HCC)    EEG scheduled for next week      Headache disorder    She is taking Nortriptyline, Maxalt PRN      Relevant Medications   nortriptyline (PAMELOR) 10 MG capsule   rizatriptan (MAXALT-MLT) 10 MG disintegrating tablet   Urge incontinence    Discussed bladder training - needs to empty bladder at least every 4 hours and not hold her urine for several hours at a time.  Declines pelvic floor PT.         Meds ordered this encounter  Medications  . olmesartan (BENICAR) 5 MG tablet    Sig: TAKE 1  TABLET(5 MG) BY MOUTH DAILY    Dispense:  90 tablet    Refill:  0    Order Specific Question:   Supervising Provider    Answer:   Steele Sizer [[id number]]  . metFORMIN (GLUCOPHAGE XR) 500 MG 24 hr tablet    Sig: Take 1 tablet (500 mg total) by mouth 2 (two) times daily.    Dispense:  180 tablet    Refill:  1    Order Specific Question:  Supervising Provider    Answer:   Steele Sizer [[id number]]    Follow-up: Return in about 3 months (around 11/13/2018) for follow up.   Hubbard Hartshorn, FNP

## 2018-08-15 NOTE — Assessment & Plan Note (Signed)
Keep follow up with neurology, and has EEG scheduled. Continue current medication regimen

## 2018-08-15 NOTE — Assessment & Plan Note (Signed)
Taking vyvanse and doing well on this. Keep follow up with psychiatry.

## 2018-08-15 NOTE — Assessment & Plan Note (Signed)
Will check A1C and adjust medications if needed.  Needs eye exam faxed to our office. Foot exam is normal. Change Metformin to XR to help with GI SE's.

## 2018-08-15 NOTE — Assessment & Plan Note (Signed)
Saw Dr. Saunders Revel in the past, was told to follow up only PRN.

## 2018-08-15 NOTE — Assessment & Plan Note (Signed)
She is taking Nortriptyline, Maxalt PRN

## 2018-08-15 NOTE — Assessment & Plan Note (Signed)
Tapering down on Lorazepam with goal of coming off - continue to work with psychiatry.

## 2018-08-15 NOTE — Assessment & Plan Note (Addendum)
Discussed bladder training - needs to empty bladder at least every 4 hours and not hold her urine for several hours at a time.  Declines pelvic floor PT.

## 2018-08-18 ENCOUNTER — Encounter: Payer: Self-pay | Admitting: Family Medicine

## 2018-08-24 ENCOUNTER — Encounter: Payer: Self-pay | Admitting: Family Medicine

## 2018-08-26 ENCOUNTER — Encounter: Payer: Self-pay | Admitting: Family Medicine

## 2018-08-27 NOTE — Telephone Encounter (Unsigned)
Copied from [place]. Topic: Quick Communication - See Telephone Encounter >> Aug 27, 2018  8:45 AM Nils Flack wrote: CRM for notification. See Telephone encounter for: 08/27/18. Pt insurance has changed and needs new referral place for neurology, so she can go to [place].  Thanks

## 2018-08-27 NOTE — Telephone Encounter (Unsigned)
Copied from [place]. Topic: Quick Communication - See Telephone Encounter >> Aug 27, 2018  8:45 AM Nils Flack wrote: CRM for notification. See Telephone encounter for: 08/27/18. Pt insurance has changed and needs new referral place for neurology, so she can go to [place].  Thanks

## 2018-10-07 ENCOUNTER — Encounter: Payer: Self-pay | Admitting: Family Medicine

## 2018-10-23 ENCOUNTER — Other Ambulatory Visit: Payer: Self-pay | Admitting: Nurse Practitioner

## 2018-10-23 DIAGNOSIS — I10 Essential (primary) hypertension: Secondary | ICD-10-CM

## 2018-11-14 ENCOUNTER — Encounter: Payer: Self-pay | Admitting: Family Medicine

## 2018-11-14 ENCOUNTER — Other Ambulatory Visit: Payer: Self-pay

## 2018-11-14 ENCOUNTER — Ambulatory Visit (INDEPENDENT_AMBULATORY_CARE_PROVIDER_SITE_OTHER): Payer: BLUE CROSS/BLUE SHIELD | Admitting: Family Medicine

## 2018-11-14 VITALS — BP 130/82 | HR 100 | Temp 98.1°F | Resp 16 | Ht 65.0 in | Wt 235.2 lb

## 2018-11-14 DIAGNOSIS — R519 Headache, unspecified: Secondary | ICD-10-CM

## 2018-11-14 DIAGNOSIS — E119 Type 2 diabetes mellitus without complications: Secondary | ICD-10-CM | POA: Diagnosis not present

## 2018-11-14 DIAGNOSIS — N3941 Urge incontinence: Secondary | ICD-10-CM

## 2018-11-14 DIAGNOSIS — E041 Nontoxic single thyroid nodule: Secondary | ICD-10-CM

## 2018-11-14 DIAGNOSIS — R569 Unspecified convulsions: Secondary | ICD-10-CM

## 2018-11-14 DIAGNOSIS — I10 Essential (primary) hypertension: Secondary | ICD-10-CM | POA: Diagnosis not present

## 2018-11-14 DIAGNOSIS — R404 Transient alteration of awareness: Secondary | ICD-10-CM

## 2018-11-14 DIAGNOSIS — I38 Endocarditis, valve unspecified: Secondary | ICD-10-CM

## 2018-11-14 DIAGNOSIS — F411 Generalized anxiety disorder: Secondary | ICD-10-CM

## 2018-11-14 DIAGNOSIS — I517 Cardiomegaly: Secondary | ICD-10-CM

## 2018-11-14 DIAGNOSIS — R51 Headache: Secondary | ICD-10-CM

## 2018-11-14 DIAGNOSIS — F902 Attention-deficit hyperactivity disorder, combined type: Secondary | ICD-10-CM

## 2018-11-14 DIAGNOSIS — Z1231 Encounter for screening mammogram for malignant neoplasm of breast: Secondary | ICD-10-CM

## 2018-11-14 DIAGNOSIS — E782 Mixed hyperlipidemia: Secondary | ICD-10-CM

## 2018-11-14 DIAGNOSIS — F3342 Major depressive disorder, recurrent, in full remission: Secondary | ICD-10-CM

## 2018-11-14 NOTE — Progress Notes (Signed)
Name: Melanie Villarreal   MRN: [id number]    DOB: 10/31/1975   Date:11/14/2018       Progress Note  Subjective  Chief Complaint  Chief Complaint  Patient presents with  . Follow-up    HPI   Diabetes mellitus type 2 Checking sugars?  yes How often? Daily Range (low to high) over last two weeks:  100-150 Trying to limit white bread, white rice, white potatoes, sweets?  yes Trying to limit sweetened drinks like iced tea, soft drinks, sports drinks, fruit juices?  yes Checking feet every day/night?  yes Last eye exam:  UTD Denies: Polyuria, polydipsia, polyphagia, vision changes, or neuropathy. Most recent A1C:  Lab Results  Component Value Date   HGBA1C 6.6 08/15/2018    We will recheck today. Last CMP Results : is due for repeat today Urine Micro UTD? Yes Current Medication Management: Diabetic Medications:  ACEI/ARB: Yes Statin: Yes Aspirin therapy: Yes  HTN:  -does take medications as prescribed - current regimen includes Olmesartan, metoprolol daily.  - taking medications as instructed, no medication side effects noted, no TIAs, no chest pain on exertion, no dyspnea on exertion, no swelling of ankles, no orthostatic dizziness or lightheadedness - DASH diet discussed - pt does follow a low sodium diet; salt shaker not on table - The following  are contributing factors: stress, sedentary lifestyle.  Obesity: Body mass index is 39.14 kg/m. Weight Management History: Diet: poor meal pattern, high snack intake, frequent dining out and excessive fat intake Exercise: moderately active Co-Morbid Conditions: diabetes mellitus, dyslipidemias and hypertension; 2 or more of these conditions combined with BMI >30 is considered morbid obesity; is this diagnosis appropriate and/or added to patient's problem list? Yes  Thyroid Nodule:Pt reports she has been "cleared" by endocrinology after negative biopsy, reports she will have one follow up with them next year(Due for this in  February 2019). FNA of thyroid nodule was benign in February 2019. Was referred to Dr. Gabriel Carina several months ago, but had to reschedule due to COVID 19.  She does have upcoming appointment.  Denies heat/cold intolerance, no hair/skin/nail changes, or palpitations.   Altered Awareness/Seizure-like activity/Headaches:Pt was referred and went to see Dr. Melrose Nakayama in December due to multiple episodes of reported altered awareness with unclear etiology. Dr. Melrose Nakayama ordered EEG, will consider brain MRI if symptoms worsen.  She now has appt on June 6 with [facility].  He started her on nortriptyline daily and maxalt 10mg  PRN for headaches.  She has taken Maxalt only a few times, and it has been effective when needed.  Chest Pain and Sinus Tachycardia and HLDand LVH/Valvular Regurgitation: Saw Dr. Saunders Revel, was told to only follow up as needed as her Cardiac CTA was clear. She denies chest pain, palpitations, or shortness of breath. Last lipids were 01/20/18 and showed elevated triglycerides and HDL was low -again discussedlifestyle changes to help. She is taking meoprolol 50mg  daily. times a day.Taking atorvastatin, olmesartan, and metoprolol. Lipid panel due today; stable and unchanged otherwise.  ADHD: She sees psychiatry for ADHD, GAD, and Depression.She did start Vybrid and this seems to be working much better for her.  She is going to continue to see psychiatry for the time being as she is still taking lorazepam, she is tapering down and off of Lorazepam.   GAD and Depression: She is taking Lorazepam 1mg  daily and would like to come off of this medication. She has been seeing psychiatry, but would like to transfer her psychiatric care to  our office once she is off of benzos.She is going to continue to see psychiatry for the time being as she is still taking lorazepam, but is tapering down and off slowly. Stable at this time.  Urge Incontinence: She has noticed this worsening over the last few months.   She has trouble making it to the bathroom - maybe once every few weeks. She has tried Dealer and this has not helped her. No abdominal pain, no abnormal vaginal bleeding, no hematuria, no dysuria, no bulging or mass in vaginal canal.  She has been working on training her bladder with timed urination and this seems to be working well.   Patient Active Problem List   Diagnosis Date Noted  . Urge incontinence 08/15/2018  . Seizure-like activity ([place]) 07/14/2018  . Altered awareness, transient 06/17/2018  . Headache disorder 06/17/2018  . Family history of premature CAD 09/11/2017  . Thyroid nodule 08/15/2017  . LVH (left ventricular hypertrophy) 02/01/2017  . Elevated serum glutamic pyruvic transaminase (SGPT) level 07/24/2016  . Morbid obesity ([place]) 03/23/2016  . Sinus tachycardia 02/24/2016  . Valvular regurgitation 01/11/2016  . ADHD (attention deficit hyperactivity disorder), combined type 08/30/2015  . Generalized anxiety disorder 12/31/2014  . Major depressive disorder, recurrent episode ([place]) 10/25/2014  . Diabetes mellitus type 2, controlled, without complications ([place]) 63/14/9702  . Essential hypertension 10/25/2014    Past Surgical History:  Procedure Laterality Date  . CESAREAN SECTION    . thyroid nodule biopsy    . TUBAL LIGATION      Family History  Problem Relation Age of Onset  . Heart disease Mother   . Hyperlipidemia Mother   . Hypertension Mother   . Miscarriages / Stillbirths Mother        2  . Anxiety disorder Mother   . Coronary artery disease Mother 39       11 stents  . Alcohol abuse Father   . Cancer Father        testicular  . Depression Father   . Heart disease Sister   . Hyperlipidemia Sister   . Hypertension Sister   . Other Sister         Langerhans Cell Histiocytosis  . Drug abuse Brother   . Alcohol abuse Brother   . Depression Brother   . ADD / ADHD Son   . Mental illness Maternal Aunt   . Alcohol abuse Maternal Grandfather   .  Depression Maternal Grandfather   . Anxiety disorder Maternal Grandfather   . Cancer Paternal Grandmother   . Alcohol abuse Paternal Grandfather     Social History   Socioeconomic History  . Marital status: Married    Spouse name: Erlene Quan  . Number of children: 3  . Years of education: 2 Masters  . Highest education level: Master's degree (e.g., MA, MS, MEng, MEd, MSW, MBA)  Occupational History  . Not on file  Social Needs  . Financial resource strain: Not very hard  . Food insecurity:    Worry: Patient refused    Inability: Patient refused  . Transportation needs:    Medical: Patient refused    Non-medical: Patient refused  Tobacco Use  . Smoking status: Former Smoker    Types: Cigarettes, E-cigarettes    Last attempt to quit: 06/29/2017    Years since quitting: 1.3  . Smokeless tobacco: Never Used  . Tobacco comment: On and off since 7 yrs of age; now using e-cigarettes  Substance and Sexual Activity  . Alcohol use:  Yes    Alcohol/week: 0.0 standard drinks    Frequency: Never    Comment: rare  . Drug use: No  . Sexual activity: Yes    Partners: Male  Lifestyle  . Physical activity:    Days per week: 3 days    Minutes per session: 60 min  . Stress: Only a little  Relationships  . Social connections:    Talks on phone: More than three times a week    Gets together: Once a week    Attends religious service: More than 4 times per year    Active member of club or organization: Yes    Attends meetings of clubs or organizations: 1 to 4 times per year    Relationship status: Married  . Intimate partner violence:    Fear of current or ex partner: No    Emotionally abused: No    Physically abused: No    Forced sexual activity: No  Other Topics Concern  . Not on file  Social History Narrative  . Not on file     Current Outpatient Medications:  .  aspirin EC 81 MG tablet, Take 1 tablet (81 mg total) by mouth daily., Disp: , Rfl:  .  atorvastatin (LIPITOR) 20  MG tablet, TAKE 1 TABLET(20 MG) BY MOUTH AT BEDTIME, Disp: 90 tablet, Rfl: 1 .  LORazepam (ATIVAN) 0.5 MG tablet, Take 1 mg by mouth daily. , Disp: , Rfl: 0 .  metFORMIN (GLUCOPHAGE XR) 500 MG 24 hr tablet, Take 1 tablet (500 mg total) by mouth 2 (two) times daily., Disp: 180 tablet, Rfl: 1 .  metoprolol tartrate (LOPRESSOR) 25 MG tablet, TAKE 1 TABLET(25 MG) BY MOUTH TWICE DAILY, Disp: 180 tablet, Rfl: 1 .  nortriptyline (PAMELOR) 10 MG capsule, Take 1 pill at night for one week then increase to 2 pills at night, Disp: , Rfl:  .  olmesartan (BENICAR) 5 MG tablet, TAKE 1 TABLET(5 MG) BY MOUTH DAILY, Disp: 90 tablet, Rfl: 0 .  rizatriptan (MAXALT-MLT) 10 MG disintegrating tablet, DISSOLVE 1 T PO ONCE PRF MIGRAINE. MAY TK A SECOND DOSE AFTER 2 H IF NEEDED, Disp: , Rfl:  .  Semaglutide (OZEMPIC) 0.25 or 0.5 MG/DOSE SOPN, Inject 0.5 mg into the skin once a week., Disp: 4 pen, Rfl: 2 .  Vilazodone HCl (VIIBRYD) 40 MG TABS, , Disp: , Rfl:  .  VYVANSE 50 MG capsule, Take 50 mg by mouth daily., Disp: , Rfl: 0  Allergies  Allergen Reactions  . Penicillins Anaphylaxis  . Latex Hives and Rash    I personally reviewed active problem list, medication list, allergies, notes from last encounter, lab results with the patient/caregiver today.   ROS  Constitutional: Negative for fever; + weight gain.  Respiratory: Negative for cough and shortness of breath.   Cardiovascular: Negative for chest pain or palpitations.  Gastrointestinal: Negative for abdominal pain, no bowel changes.  Musculoskeletal: Negative for gait problem or joint swelling.  Skin: Negative for rash.  Neurological: Negative for dizziness or headache.  No other specific complaints in a complete review of systems (except as listed in HPI above).  Objective  Vitals:   11/14/18 1022  BP: 130/82  Pulse: 100  Resp: 16  Temp: 98.1 F (36.7 C)  TempSrc: Oral  SpO2: 96%  Weight: 235 lb 3.2 oz (106.7 kg)  Height: 5\' 5"  (1.651 m)     Body mass index is 39.14 kg/m.  Physical Exam  Constitutional: Patient appears well-developed and well-nourished. No  distress.  HENT: Head: Normocephalic and atraumatic. Eyes: Conjunctivae and EOM are normal. No scleral icterus. Neck: Normal range of motion. Neck supple. No JVD present. No thyromegaly present.  Cardiovascular: Normal rate, regular rhythm and normal heart sounds.  No murmur heard. No BLE edema. Pulmonary/Chest: Effort normal and breath sounds normal. No respiratory distress. Musculoskeletal: Normal range of motion, no joint effusions. No gross deformities Neurological: Pt is alert and oriented to person, place, and time. No cranial nerve deficit. Coordination, balance, strength, speech and gait are normal.  Skin: Skin is warm and dry. No rash noted. No erythema.  Psychiatric: Patient has a normal mood and affect. behavior is normal. Judgment and thought content normal.  No results found for this or any previous visit (from the past 72 hour(s)).  PHQ2/9: Depression screen [facility] 2/9 11/14/2018 08/15/2018 05/13/2018 03/04/2018 01/20/2018  Decreased Interest 0 0 0 0 0  Down, Depressed, Hopeless 0 0 0 0 0  PHQ - 2 Score 0 0 0 0 0  Altered sleeping 0 0 0 0 0  Tired, decreased energy 0 0 0 0 0  Change in appetite 0 0 0 0 0  Feeling bad or failure about yourself  0 0 0 0 0  Trouble concentrating 0 0 0 0 1  Moving slowly or fidgety/restless 0 0 0 0 0  Suicidal thoughts 0 0 0 0 0  PHQ-9 Score 0 0 0 0 1  Difficult doing work/chores Not difficult at all Not difficult at all Not difficult at all Not difficult at all Somewhat difficult   PHQ-2/9 Result is negative.    Fall Risk: Fall Risk  11/14/2018 08/15/2018 05/13/2018 01/20/2018 08/07/2017  Falls in the past year? 0 0 No No No  Number falls in past yr: 0 0 - - -  Injury with Fall? 0 0 - - -  Follow up Falls evaluation completed Falls evaluation completed - - -   Assessment & Plan  1. Controlled type 2 diabetes mellitus without  complication, without long-term current use of insulin (HCC) - COMPLETE METABOLIC PANEL WITH GFR - Hemoglobin A1c  2. Essential hypertension - CMP per orders, continue current regimen, DASH diet reinforced.  3. Thyroid nodule - Needs follow up with endocrinology.  4. Seizure-like activity ([place]) - Follow up with neurology.  5. Altered awareness, transient - Follow up with neurology  6. Morbid obesity ([place]) - Lipid panel  7. Mixed hyperlipidemia - Lipid Panel  8. Valvular regurgitation - Stable  9. LVH (left ventricular hypertrophy) - Stable  10. ADHD (attention deficit hyperactivity disorder), combined type - Continue follow up with psychiatry  11. Major depressive disorder, recurrent, in full remission with anxious distress ([place]) - Continue follow up with psychiatry  12. Generalized anxiety disorder - Tapering off of Lorazepam with psychiatry.  13. Headache disorder - Continue follow up with neurology.  15. Urge incontinence - Stable  16. Breast cancer screening by mammogram - MM 3D SCREEN BREAST BILATERAL; Future

## 2018-11-15 LAB — HEMOGLOBIN A1C
Hgb A1c MFr Bld: 7.2 % of total Hgb — ABNORMAL HIGH (ref <5.7)
Mean Plasma Glucose: 160 (calc)
eAG (mmol/L): 8.9 (calc)

## 2018-11-15 LAB — COMPLETE METABOLIC PANEL WITH GFR
AG Ratio: 1.7 (calc) (ref 1.0–2.5)
ALT: 38 U/L — ABNORMAL HIGH (ref 6–29)
AST: 23 U/L (ref 10–30)
Albumin: 4.5 g/dL (ref 3.6–5.1)
Alkaline phosphatase (APISO): 77 U/L (ref 31–125)
BUN: 11 mg/dL (ref 7–25)
CO2: 22 mmol/L (ref 20–32)
Calcium: 9.5 mg/dL (ref 8.6–10.2)
Chloride: 104 mmol/L (ref 98–110)
Creat: 0.76 mg/dL (ref 0.50–1.10)
GFR, Est African American: 114 mL/min/{1.73_m2} (ref >=60)
GFR, Est Non African American: 98 mL/min/{1.73_m2} (ref >=60)
Globulin: 2.6 g/dL (calc) (ref 1.9–3.7)
Glucose, Bld: 146 mg/dL — ABNORMAL HIGH (ref 65–99)
Potassium: 4.4 mmol/L (ref 3.5–5.3)
Sodium: 137 mmol/L (ref 135–146)
Total Bilirubin: 0.7 mg/dL (ref 0.2–1.2)
Total Protein: 7.1 g/dL (ref 6.1–8.1)

## 2018-11-15 LAB — LIPID PANEL
Cholesterol: 112 mg/dL (ref <200)
HDL: 29 mg/dL — ABNORMAL LOW (ref >=50)
LDL Cholesterol (Calc): 56 mg/dL (calc)
Non-HDL Cholesterol (Calc): 83 mg/dL (calc) (ref <130)
Total CHOL/HDL Ratio: 3.9 (calc) (ref <5.0)
Triglycerides: 199 mg/dL — ABNORMAL HIGH (ref <150)

## 2018-11-17 ENCOUNTER — Encounter: Payer: Self-pay | Admitting: Family Medicine

## 2018-11-17 DIAGNOSIS — E118 Type 2 diabetes mellitus with unspecified complications: Secondary | ICD-10-CM

## 2018-11-17 MED ORDER — METFORMIN HCL ER 500 MG PO TB24: 1000.0000 mg | ORAL_TABLET | Freq: Two times a day (BID) | ORAL | 1 refills | Status: DC

## 2018-12-22 ENCOUNTER — Encounter: Payer: Self-pay | Admitting: Family Medicine

## 2018-12-23 ENCOUNTER — Other Ambulatory Visit: Payer: Self-pay | Admitting: Family Medicine

## 2018-12-23 DIAGNOSIS — I10 Essential (primary) hypertension: Secondary | ICD-10-CM

## 2019-01-23 ENCOUNTER — Encounter: Payer: Self-pay | Admitting: Family Medicine

## 2019-01-23 ENCOUNTER — Other Ambulatory Visit: Payer: Self-pay | Admitting: Family Medicine

## 2019-01-23 DIAGNOSIS — E782 Mixed hyperlipidemia: Secondary | ICD-10-CM

## 2019-01-23 DIAGNOSIS — E119 Type 2 diabetes mellitus without complications: Secondary | ICD-10-CM

## 2019-02-20 ENCOUNTER — Encounter: Payer: Self-pay | Admitting: Family Medicine

## 2019-02-20 DIAGNOSIS — E119 Type 2 diabetes mellitus without complications: Secondary | ICD-10-CM

## 2019-02-20 DIAGNOSIS — Z20828 Contact with and (suspected) exposure to other viral communicable diseases: Secondary | ICD-10-CM

## 2019-02-20 DIAGNOSIS — Z20822 Contact with and (suspected) exposure to covid-19: Secondary | ICD-10-CM

## 2019-02-21 ENCOUNTER — Encounter: Payer: Self-pay | Admitting: Family Medicine

## 2019-03-12 ENCOUNTER — Other Ambulatory Visit: Payer: Self-pay | Admitting: Nurse Practitioner

## 2019-03-12 DIAGNOSIS — E119 Type 2 diabetes mellitus without complications: Secondary | ICD-10-CM

## 2019-03-13 ENCOUNTER — Other Ambulatory Visit: Payer: Self-pay | Admitting: Nurse Practitioner

## 2019-03-13 DIAGNOSIS — E119 Type 2 diabetes mellitus without complications: Secondary | ICD-10-CM

## 2019-03-17 ENCOUNTER — Encounter: Payer: Self-pay | Admitting: Family Medicine

## 2019-03-17 ENCOUNTER — Encounter: Payer: BLUE CROSS/BLUE SHIELD | Admitting: Family Medicine

## 2019-03-27 ENCOUNTER — Other Ambulatory Visit: Payer: Self-pay | Admitting: Family Medicine

## 2019-03-27 DIAGNOSIS — I10 Essential (primary) hypertension: Secondary | ICD-10-CM

## 2019-03-31 ENCOUNTER — Encounter: Payer: Self-pay | Admitting: Family Medicine

## 2019-03-31 ENCOUNTER — Other Ambulatory Visit: Payer: Self-pay

## 2019-03-31 ENCOUNTER — Ambulatory Visit (INDEPENDENT_AMBULATORY_CARE_PROVIDER_SITE_OTHER): Payer: BLUE CROSS/BLUE SHIELD | Admitting: Family Medicine

## 2019-03-31 VITALS — BP 124/82 | HR 96 | Temp 97.6°F | Resp 16 | Ht 65.0 in | Wt 230.4 lb

## 2019-03-31 DIAGNOSIS — R51 Headache: Secondary | ICD-10-CM

## 2019-03-31 DIAGNOSIS — E782 Mixed hyperlipidemia: Secondary | ICD-10-CM

## 2019-03-31 DIAGNOSIS — F902 Attention-deficit hyperactivity disorder, combined type: Secondary | ICD-10-CM

## 2019-03-31 DIAGNOSIS — R404 Transient alteration of awareness: Secondary | ICD-10-CM

## 2019-03-31 DIAGNOSIS — E119 Type 2 diabetes mellitus without complications: Secondary | ICD-10-CM | POA: Diagnosis not present

## 2019-03-31 DIAGNOSIS — I38 Endocarditis, valve unspecified: Secondary | ICD-10-CM

## 2019-03-31 DIAGNOSIS — I10 Essential (primary) hypertension: Secondary | ICD-10-CM

## 2019-03-31 DIAGNOSIS — N3941 Urge incontinence: Secondary | ICD-10-CM

## 2019-03-31 DIAGNOSIS — R945 Abnormal results of liver function studies: Secondary | ICD-10-CM

## 2019-03-31 DIAGNOSIS — E041 Nontoxic single thyroid nodule: Secondary | ICD-10-CM | POA: Diagnosis not present

## 2019-03-31 DIAGNOSIS — F411 Generalized anxiety disorder: Secondary | ICD-10-CM

## 2019-03-31 DIAGNOSIS — R7989 Other specified abnormal findings of blood chemistry: Secondary | ICD-10-CM

## 2019-03-31 DIAGNOSIS — I517 Cardiomegaly: Secondary | ICD-10-CM

## 2019-03-31 DIAGNOSIS — F3342 Major depressive disorder, recurrent, in full remission: Secondary | ICD-10-CM

## 2019-03-31 DIAGNOSIS — R519 Headache, unspecified: Secondary | ICD-10-CM

## 2019-03-31 NOTE — Progress Notes (Signed)
Name: Melanie Villarreal   MRN: [id number]    DOB: 08-27-1975   Date:03/31/2019       Progress Note  Subjective  Chief Complaint  Chief Complaint  Patient presents with  . Diabetes    follow up  . Hyperlipidemia  . Hypertension    HPI  Diabetes mellitus type 2 Checking sugars?  yes How often? Daily Range (low to high) over last two weeks: 92-146 Trying to limit white bread, white rice, white potatoes, sweets?  trying to do this as much as possible. Trying to limit sweetened drinks like iced tea, soft drinks, sports drinks, fruit juices?  yes Checking feet every day/night?  yes Last eye exam:  UTD  Denies: Polyuria, polydipsia, polyphagia, vision changes; does note occasional numbness/tingling in bilateral toes. Most recent A1C:  Lab Results  Component Value Date   HGBA1C 7.2 (H) 11/14/2018  Last CMP Results : is due for repeat today Urine Micro UTD? Yes Current Medication Management: Diabetic Medications:  ACEI/ARB: Yes Statin: Yes Aspirin therapy: Yes  HTN:  -does take medications as prescribed - current regimen includes Olmesartan, metoprolol daily.  - taking medications as instructed, no medication side effects noted, no TIAs, no chest pain on exertion, no dyspnea on exertion, no swelling of ankles, no orthostatic dizziness or lightheadedness - DASH diet discussed - pt does follow a low sodium diet; salt shaker not on table - The following  are contributing factors: stress, sedentary lifestyle.  Obesity: Body mass index is 38.34 kg/m. Weight Management History: Diet: poor meal pattern, high snack intake, frequent dining out and excessive fat intake Exercise: she has [employer] but has not started; is planning to also walk at work.  Co-Morbid Conditions: diabetes mellitus, dyslipidemias and hypertension; 2 or more of these conditions combined with BMI >30 is considered morbid obesity; is this diagnosis appropriate and/or added to patient's problem list?  Yes  Thyroid Nodule:Pt reports she has been "cleared" by endocrinology after negative biopsy, reports she will have one follow up with them next year(Due for this in February 2019). FNA of thyroid nodule was benign in February 2019.Was referred to Dr. Gabriel Carina several months ago, but had to reschedule due to COVID 19 - she forgot to reschedule, so we will send new referral. Denies heat/cold intolerance, no hair/skin/nail changes, or palpitations.   Altered Awareness/Seizure-like activity/Headaches:Pt was referred and went to see Dr. Melrose Nakayama in December due to multiple episodes of reported altered awareness with unclear etiology. Dr. Melrose Nakayama - she had to transfer to [facility] neuro due to her insurance and is now seeing Dr. Posey Pronto. Had recent appointment with normal EEG. She is taking nortiptyline daily and maxalt 10mg  PRN for headaches. She has taken Maxalt only a few times, and it has been effective when needed.  Chest Pain and Sinus Tachycardia and HLDand LVH/Valvular Regurgitation: Saw Dr. Saunders Revel, was told to only follow up as needed as her Cardiac CTA was clear. She denies chest pain, palpitations, or shortness of breath. Last lipids were May 2020 and showed elevated triglycerides and HDL was low -again discussedlifestyle changes to help. She is taking meoprolol 50mg daily.times a day.Taking atorvastatin, olmesartan, and metoprolol. Unchanged and stable.  ADHD: She sees psychiatry for ADHD, GAD, and Depression.She did start Vybrid and this seems to be working much better for her.She is going to continue to see psychiatry, but did advise that we can Rx her vyvanse here pending notes from psychiatry stating that they are comfortable with this transfer of care.  Patient to coordinate this.  GAD and Depression: She is no longer taking Lorazepam 1mg  and is using hydroxyzine daily. She has been seeing psychiatry, but would like to transfer her psychiatric care to our office - we will wait for  records from psychiatry.   Urge Incontinence: She notes improvement over the last few months - only 3 episodes since last follow up. She has trouble making it to the bathroom - maybe once every few weeks. She has tried [employer]  and these did seem to help. No abdominal pain, no abnormal vaginal bleeding, no hematuria, no dysuria, no bulging or mass in vaginal canal.  She has been working on training her bladder with timed urination and this seems to be working well.   Patient Active Problem List   Diagnosis Date Noted  . Urge incontinence 08/15/2018  . Seizure-like activity ([place]) 07/14/2018  . Altered awareness, transient 06/17/2018  . Headache disorder 06/17/2018  . Family history of premature CAD 09/11/2017  . Thyroid nodule 08/15/2017  . LVH (left ventricular hypertrophy) 02/01/2017  . Elevated serum glutamic pyruvic transaminase (SGPT) level 07/24/2016  . Morbid obesity ([place]) 03/23/2016  . Sinus tachycardia 02/24/2016  . Valvular regurgitation 01/11/2016  . ADHD (attention deficit hyperactivity disorder), combined type 08/30/2015  . Generalized anxiety disorder 12/31/2014  . Major depressive disorder, recurrent episode ([place]) 10/25/2014  . Diabetes mellitus type 2, controlled, without complications ([place]) [id number]  . Essential hypertension 10/25/2014    Past Surgical History:  Procedure Laterality Date  . CESAREAN SECTION    . thyroid nodule biopsy    . TUBAL LIGATION      Family History  Problem Relation Age of Onset  . Heart disease Mother   . Hyperlipidemia Mother   . Hypertension Mother   . Miscarriages / Stillbirths Mother        2  . Anxiety disorder Mother   . Coronary artery disease Mother 50       11 stents  . Alcohol abuse Father   . Cancer Father        testicular  . Depression Father   . Heart disease Sister   . Hyperlipidemia Sister   . Hypertension Sister   . Other Sister         Langerhans Cell Histiocytosis  . Drug abuse Brother   . Alcohol abuse  Brother   . Depression Brother   . ADD / ADHD Son   . Mental illness Maternal Aunt   . Alcohol abuse Maternal Grandfather   . Depression Maternal Grandfather   . Anxiety disorder Maternal Grandfather   . Cancer Paternal Grandmother   . Alcohol abuse Paternal Grandfather     Social History   Socioeconomic History  . Marital status: Married    Spouse name: Erlene Quan  . Number of children: 3  . Years of education: 2 Masters  . Highest education level: Master's degree (e.g., MA, MS, MEng, MEd, MSW, MBA)  Occupational History  . Not on file  Social Needs  . Financial resource strain: Not very hard  . Food insecurity    Worry: Patient refused    Inability: Patient refused  . Transportation needs    Medical: Patient refused    Non-medical: Patient refused  Tobacco Use  . Smoking status: Former Smoker    Types: Cigarettes, E-cigarettes    Quit date: 06/29/2017    Years since quitting: 1.7  . Smokeless tobacco: Never Used  . Tobacco comment: On and off since 68 yrs of age;  now using e-cigarettes  Substance and Sexual Activity  . Alcohol use: Yes    Alcohol/week: 0.0 standard drinks    Frequency: Never    Comment: rare  . Drug use: No  . Sexual activity: Yes    Partners: Male  Lifestyle  . Physical activity    Days per week: 3 days    Minutes per session: 60 min  . Stress: Only a little  Relationships  . Social connections    Talks on phone: More than three times a week    Gets together: Once a week    Attends religious service: More than 4 times per year    Active member of club or organization: Yes    Attends meetings of clubs or organizations: 1 to 4 times per year    Relationship status: Married  . Intimate partner violence    Fear of current or ex partner: No    Emotionally abused: No    Physically abused: No    Forced sexual activity: No  Other Topics Concern  . Not on file  Social History Narrative  . Not on file     Current Outpatient Medications:  .   aspirin EC 81 MG tablet, Take 1 tablet (81 mg total) by mouth daily., Disp: , Rfl:  .  atorvastatin (LIPITOR) 20 MG tablet, TAKE 1 TABLET(20 MG) BY MOUTH AT BEDTIME, Disp: 90 tablet, Rfl: 0 .  metFORMIN (GLUCOPHAGE) 1000 MG tablet, TAKE 1 TABLET(1000 MG) BY MOUTH TWICE DAILY WITH A MEAL, Disp: 180 tablet, Rfl: 3 .  metoprolol tartrate (LOPRESSOR) 25 MG tablet, TAKE 1 TABLET(25 MG) BY MOUTH TWICE DAILY, Disp: 180 tablet, Rfl: 1 .  nortriptyline (PAMELOR) 10 MG capsule, Take 1 pill at night for one week then increase to 2 pills at night, Disp: , Rfl:  .  olmesartan (BENICAR) 5 MG tablet, TAKE 1 TABLET(5 MG) BY MOUTH DAILY, Disp: 90 tablet, Rfl: 0 .  OZEMPIC, 0.25 OR 0.5 MG/DOSE, 2 MG/1.5ML SOPN, INJECT 0.5 MG INTO THE SKIN ONCE A WEEK, Disp: 6 mL, Rfl: 0 .  rizatriptan (MAXALT-MLT) 10 MG disintegrating tablet, DISSOLVE 1 T PO ONCE PRF MIGRAINE. MAY TK A SECOND DOSE AFTER 2 H IF NEEDED, Disp: , Rfl:  .  Vilazodone HCl (VIIBRYD) 40 MG TABS, , Disp: , Rfl:  .  VYVANSE 50 MG capsule, Take 50 mg by mouth daily., Disp: , Rfl: 0 .  LORazepam (ATIVAN) 0.5 MG tablet, Take 1 mg by mouth daily. , Disp: , Rfl: 0 .  metFORMIN (GLUCOPHAGE XR) 500 MG 24 hr tablet, Take 2 tablets (1,000 mg total) by mouth 2 (two) times daily., Disp: 180 tablet, Rfl: 1  Allergies  Allergen Reactions  . Penicillins Anaphylaxis  . Latex Hives and Rash    I personally reviewed active problem list, medication list, allergies, health maintenance, notes from last encounter, lab results with the patient/caregiver today.   ROS  Constitutional: Negative for fever or weight change.  Respiratory: Negative for cough and shortness of breath.   Cardiovascular: Negative for chest pain or palpitations.  Gastrointestinal: Negative for abdominal pain, no bowel changes.  Musculoskeletal: Negative for gait problem or joint swelling.  Skin: Negative for rash.  Neurological: Negative for dizziness or headache.  No other specific complaints in  a complete review of systems (except as listed in HPI above).   Objective  Vitals:   03/31/19 0835  BP: 124/82  Pulse: 96  Resp: 16  Temp: 97.6 F (36.4 C)  TempSrc: Oral  SpO2: 98%  Weight: 230 lb 6.4 oz (104.5 kg)  Height: 5\' 5"  (1.651 m)    Body mass index is 38.34 kg/m.  Physical Exam  Constitutional: Patient appears well-developed and well-nourished. No distress.  HENT: Head: Normocephalic and atraumatic. Eyes: Conjunctivae and EOM are normal. No scleral icterus.   Neck: Normal range of motion. Neck supple. No JVD present. thyromegaly present R>L Cardiovascular: Normal rate, regular rhythm and normal heart sounds.  No murmur heard. No BLE edema. Pulmonary/Chest: Effort normal and breath sounds normal. No respiratory distress. Musculoskeletal: Normal range of motion, no joint effusions. No gross deformities Neurological: Pt is alert and oriented to person, place, and time. No cranial nerve deficit. Coordination, balance, strength, speech and gait are normal.  Skin: Skin is warm and dry. No rash noted. No erythema.  Psychiatric: Patient has a normal mood and affect. behavior is normal. Judgment and thought content normal.  No results found for this or any previous visit (from the past 72 hour(s)).  PHQ2/9: Depression screen [facility] 2/9 03/31/2019 11/14/2018 08/15/2018 05/13/2018 03/04/2018  Decreased Interest 1 0 0 0 0  Down, Depressed, Hopeless 0 0 0 0 0  PHQ - 2 Score 1 0 0 0 0  Altered sleeping 0 0 0 0 0  Tired, decreased energy 0 0 0 0 0  Change in appetite 1 0 0 0 0  Feeling bad or failure about yourself  0 0 0 0 0  Trouble concentrating 0 0 0 0 0  Moving slowly or fidgety/restless 0 0 0 0 0  Suicidal thoughts 0 0 0 0 0  PHQ-9 Score 2 0 0 0 0  Difficult doing work/chores Not difficult at all Not difficult at all Not difficult at all Not difficult at all Not difficult at all   PHQ-2/9 Result is negative.    Fall Risk: Fall Risk  03/31/2019 11/14/2018 08/15/2018  05/13/2018 01/20/2018  Falls in the past year? 0 0 0 No No  Number falls in past yr: 0 0 0 - -  Injury with Fall? 0 0 0 - -  Follow up - Falls evaluation completed Falls evaluation completed - -   Assessment & Plan  1. Controlled type 2 diabetes mellitus without complication, without long-term current use of insulin (HCC) - Continue medications as prescribed - COMPLETE METABOLIC PANEL WITH GFR - Hemoglobin A1c  2. Essential hypertension - Maintain medications at this time - COMPLETE METABOLIC PANEL WITH GFR  3. Morbid obesity ([place]) - Discussed importance of 150 minutes of physical activity weekly, eat two servings of fish weekly, eat one serving of tree nuts ( cashews, pistachios, pecans, almonds.Marland Kitchen) every other day, eat 6 servings of fruit/vegetables daily and drink plenty of water and avoid sweet beverages.  - COMPLETE METABOLIC PANEL WITH GFR  4. Thyroid nodule - Ambulatory referral to Endocrinology  5. Altered awareness, transient - Seeing neurology  6. Headache disorder - Seeing neurology  7. Mixed hyperlipidemia - taking medication as prescribed; dietary changes  8. Valvular regurgitation 9. LVH (left ventricular hypertrophy) - No longer seeing cardiology  10. ADHD (attention deficit hyperactivity disorder), combined type - Seeing psychiatry for management at this time, though we will consider taking over care pending notes from psychiatry  11. Major depressive disorder, recurrent, in full remission with anxious distress ([place]) - Seeing psychiatry for management at this time, though we will consider taking over care pending notes from psychiatry  12. Generalized anxiety disorder - Seeing psychiatry for management at  this time, though we will consider taking over care pending notes from psychiatry.  Off of benzo therapy which she is congratulated on.  13. Urge incontinence - doing better with bladder training and kegel exercises.  14. Elevated LFTs - encouraged  weight loss and dietary changes - COMPLETE METABOLIC PANEL WITH GFR

## 2019-04-01 ENCOUNTER — Telehealth: Payer: Self-pay | Admitting: Emergency Medicine

## 2019-04-01 ENCOUNTER — Other Ambulatory Visit: Payer: Self-pay | Admitting: *Deleted

## 2019-04-01 DIAGNOSIS — Z20822 Contact with and (suspected) exposure to covid-19: Secondary | ICD-10-CM

## 2019-04-01 LAB — HEMOGLOBIN A1C
Hgb A1c MFr Bld: 6.8 % of total Hgb — ABNORMAL HIGH (ref <5.7)
Mean Plasma Glucose: 148 (calc)
eAG (mmol/L): 8.2 (calc)

## 2019-04-01 LAB — COMPLETE METABOLIC PANEL WITH GFR
AG Ratio: 2 (calc) (ref 1.0–2.5)
ALT: 32 U/L — ABNORMAL HIGH (ref 6–29)
AST: 17 U/L (ref 10–30)
Albumin: 4.3 g/dL (ref 3.6–5.1)
Alkaline phosphatase (APISO): 64 U/L (ref 31–125)
BUN: 12 mg/dL (ref 7–25)
CO2: 25 mmol/L (ref 20–32)
Calcium: 9.1 mg/dL (ref 8.6–10.2)
Chloride: 104 mmol/L (ref 98–110)
Creat: 0.81 mg/dL (ref 0.50–1.10)
GFR, Est African American: 105 mL/min/{1.73_m2} (ref >=60)
GFR, Est Non African American: 90 mL/min/{1.73_m2} (ref >=60)
Globulin: 2.1 g/dL (calc) (ref 1.9–3.7)
Glucose, Bld: 120 mg/dL — ABNORMAL HIGH (ref 65–99)
Potassium: 4.4 mmol/L (ref 3.5–5.3)
Sodium: 139 mmol/L (ref 135–146)
Total Bilirubin: 0.9 mg/dL (ref 0.2–1.2)
Total Protein: 6.4 g/dL (ref 6.1–8.1)

## 2019-04-01 NOTE — Telephone Encounter (Signed)
FYI

## 2019-04-01 NOTE — Telephone Encounter (Signed)
Documentation reviewed 

## 2019-04-01 NOTE — Telephone Encounter (Signed)
She is being tested today. She just wanted you to know

## 2019-04-01 NOTE — Telephone Encounter (Signed)
Copied from [place] [id number]. Topic: Quick Communication - See Telephone Encounter >> Mar 31, 2019  4:46 PM Loma Boston wrote: CRM for notification. See Telephone encounter for: 03/31/19. 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 She was in to see Raquel Sarna today. She is going to get tested herself tomorrow.

## 2019-04-01 NOTE — Telephone Encounter (Signed)
If she needs order for COVID test, please let me know.

## 2019-04-02 LAB — NOVEL CORONAVIRUS, NAA: SARS-CoV-2, NAA: NOT DETECTED

## 2019-04-20 ENCOUNTER — Other Ambulatory Visit: Payer: Self-pay | Admitting: Family Medicine

## 2019-04-20 DIAGNOSIS — I10 Essential (primary) hypertension: Secondary | ICD-10-CM

## 2019-04-25 ENCOUNTER — Other Ambulatory Visit: Payer: Self-pay | Admitting: Family Medicine

## 2019-04-25 DIAGNOSIS — E782 Mixed hyperlipidemia: Secondary | ICD-10-CM

## 2019-04-25 DIAGNOSIS — E119 Type 2 diabetes mellitus without complications: Secondary | ICD-10-CM

## 2019-04-25 NOTE — Telephone Encounter (Signed)
Requested medication (s) are due for refill today: yes  Requested medication (s) are on the active medication list: yes  Last refill:  01/25/2019  Future visit scheduled: yes  Notes to clinic:  antiipid - stains failed, HDL and triglycerides not in normal range within 180 days.  Requested Prescriptions  Pending Prescriptions Disp Refills   atorvastatin (LIPITOR) 20 MG tablet [Pharmacy Med Name: ATORVASTATIN 20MG  TABLETS] 90 tablet 0    Sig: TAKE 1 TABLET(20 MG) BY MOUTH AT BEDTIME     Cardiovascular:  Antilipid - Statins Failed - 04/25/2019  3:31 AM      Failed - HDL in normal range and within 360 days    HDL  Date Value Ref Range Status  11/14/2018 29 (L) > OR = 50 mg/dL Final         Failed - Triglycerides in normal range and within 360 days    Triglycerides  Date Value Ref Range Status  11/14/2018 199 (H) <150 mg/dL Final         Passed - Total Cholesterol in normal range and within 360 days    Cholesterol  Date Value Ref Range Status  11/14/2018 112 <200 mg/dL Final         Passed - LDL in normal range and within 360 days    LDL Cholesterol (Calc)  Date Value Ref Range Status  11/14/2018 56 mg/dL (calc) Final    Comment:    Reference range: <100 . Desirable range <100 mg/dL for primary prevention;   <70 mg/dL for patients with CHD or diabetic patients  with > or = 2 CHD risk factors. Marland Kitchen LDL-C is now calculated using the Martin-Hopkins  calculation, which is a validated novel method providing  better accuracy than the Friedewald equation in the  estimation of LDL-C.  Cresenciano Genre et al. Annamaria Helling. [id number]): [id number]  (http://education.[facility].com/faq/FAQ164)          Passed - Patient is not pregnant      Passed - Valid encounter within last 12 months    Recent Outpatient Visits          3 weeks ago Controlled type 2 diabetes mellitus without complication, without long-term current use of insulin [facility])   [place], [place], FNP   5 months ago Controlled type 2 diabetes mellitus without complication, without long-term current use of insulin [facility])   [place], [place], FNP   8 months ago Controlled type 2 diabetes mellitus with complication, without long-term current use of insulin [facility])   [place], [place], FNP   11 months ago Near syncope   [place], Astrid Divine, FNP   1 year ago Controlled type 2 diabetes mellitus without complication, without long-term current use of insulin [facility])   [place], [place], FNP      Future Appointments            In 2 months Hubbard Hartshorn, [facility], [facility]

## 2019-06-13 ENCOUNTER — Encounter: Payer: Self-pay | Admitting: Family Medicine

## 2019-06-23 ENCOUNTER — Encounter: Payer: Self-pay | Admitting: Family Medicine

## 2019-06-26 ENCOUNTER — Other Ambulatory Visit: Payer: Self-pay | Admitting: Family Medicine

## 2019-06-26 DIAGNOSIS — I10 Essential (primary) hypertension: Secondary | ICD-10-CM

## 2019-06-28 NOTE — Telephone Encounter (Signed)
Requested Prescriptions  Pending Prescriptions Disp Refills  . olmesartan (BENICAR) 5 MG tablet [Pharmacy Med Name: OLMESARTAN MEDOXOMIL 5MG  TABLETS] 90 tablet 0    Sig: TAKE 1 TABLET(5 MG) BY MOUTH DAILY     Cardiovascular:  Angiotensin Receptor Blockers Passed - 06/26/2019  7:16 PM      Passed - Cr in normal range and within 180 days    Creat  Date Value Ref Range Status  03/31/2019 0.81 0.50 - 1.10 mg/dL Final   Creatinine, Urine  Date Value Ref Range Status  05/13/2018 185 20 - 275 mg/dL Final         Passed - K in normal range and within 180 days    Potassium  Date Value Ref Range Status  03/31/2019 4.4 3.5 - 5.3 mmol/L Final  08/18/2014 3.7 3.5 - 5.1 mmol/L Final         Passed - Patient is not pregnant      Passed - Last BP in normal range    BP Readings from Last 1 Encounters:  03/31/19 124/82         Passed - Valid encounter within last 6 months    Recent Outpatient Visits          2 months ago Controlled type 2 diabetes mellitus without complication, without long-term current use of insulin [facility])   [place], Astrid Divine, FNP   7 months ago Controlled type 2 diabetes mellitus without complication, without long-term current use of insulin [facility])   [place], Astrid Divine, FNP   10 months ago Controlled type 2 diabetes mellitus with complication, without long-term current use of insulin [facility])   [place], [place], FNP   1 year ago Near syncope   [place], Astrid Divine, FNP   1 year ago Controlled type 2 diabetes mellitus without complication, without long-term current use of insulin [facility])   [place], Astrid Divine, FNP      Future Appointments            In 4 days Hubbard Hartshorn, [facility], [facility]

## 2019-06-29 ENCOUNTER — Other Ambulatory Visit: Payer: Self-pay

## 2019-06-29 DIAGNOSIS — E119 Type 2 diabetes mellitus without complications: Secondary | ICD-10-CM

## 2019-06-30 MED ORDER — OZEMPIC (0.25 OR 0.5 MG/DOSE) 2 MG/1.5ML ~~LOC~~ SOPN: 0.5000 mg | PEN_INJECTOR | SUBCUTANEOUS | 3 refills | Status: DC

## 2019-07-02 ENCOUNTER — Other Ambulatory Visit: Payer: Self-pay

## 2019-07-02 ENCOUNTER — Encounter: Payer: Self-pay | Admitting: Family Medicine

## 2019-07-02 ENCOUNTER — Ambulatory Visit (INDEPENDENT_AMBULATORY_CARE_PROVIDER_SITE_OTHER): Payer: BLUE CROSS/BLUE SHIELD | Admitting: Family Medicine

## 2019-07-02 VITALS — BP 126/88 | Ht 63.0 in | Wt 203.0 lb

## 2019-07-02 DIAGNOSIS — N3941 Urge incontinence: Secondary | ICD-10-CM

## 2019-07-02 DIAGNOSIS — E041 Nontoxic single thyroid nodule: Secondary | ICD-10-CM | POA: Diagnosis not present

## 2019-07-02 DIAGNOSIS — F411 Generalized anxiety disorder: Secondary | ICD-10-CM

## 2019-07-02 DIAGNOSIS — E119 Type 2 diabetes mellitus without complications: Secondary | ICD-10-CM | POA: Diagnosis not present

## 2019-07-02 DIAGNOSIS — R404 Transient alteration of awareness: Secondary | ICD-10-CM

## 2019-07-02 DIAGNOSIS — F330 Major depressive disorder, recurrent, mild: Secondary | ICD-10-CM

## 2019-07-02 DIAGNOSIS — I10 Essential (primary) hypertension: Secondary | ICD-10-CM | POA: Diagnosis not present

## 2019-07-02 DIAGNOSIS — I517 Cardiomegaly: Secondary | ICD-10-CM

## 2019-07-02 DIAGNOSIS — R519 Headache, unspecified: Secondary | ICD-10-CM

## 2019-07-02 DIAGNOSIS — E1169 Type 2 diabetes mellitus with other specified complication: Secondary | ICD-10-CM

## 2019-07-02 DIAGNOSIS — E785 Hyperlipidemia, unspecified: Secondary | ICD-10-CM

## 2019-07-02 DIAGNOSIS — I38 Endocarditis, valve unspecified: Secondary | ICD-10-CM

## 2019-07-02 DIAGNOSIS — F902 Attention-deficit hyperactivity disorder, combined type: Secondary | ICD-10-CM

## 2019-07-02 NOTE — Progress Notes (Signed)
Name: Melanie Villarreal   MRN: [id number]    DOB: [birth year]   Date:07/02/2019       Progress Note  Subjective  Chief Complaint  Chief Complaint  Patient presents with  . Follow-up  . Diabetes  . Medication Refill    Headaches    I connected with  Ernest Pine on 07/02/19 at  8:40 AM EST by telephone and verified that I am speaking with the correct person using two identifiers.  I discussed the limitations, risks, security and privacy concerns of performing an evaluation and management service by telephone and the availability of in person appointments. Staff also discussed with the patient that there may be a patient responsible charge related to this service. Patient Location: Home Provider Location: Office Additional Individuals present: None  HPI  Diabetes mellitus type 2 Checking sugars?  yes How often? daily Range (low to high) over last two weeks: 67-140's; 67 was only once - started eating protein for breakfast and this has improved significantly. Diet:  Working on really improving her diet and being very compliant with her medications. Checking feet every day/night?  yes Last eye exam: UTD Denies: Polyuria, polydipsia, polyphagia, vision changes, or neuropathy. Most recent A1C:  Lab Results  Component Value Date   HGBA1C 6.8 (H) 03/31/2019    We will recheck today. Last CMP Results : is due for repeat today Urine Micro UTD? Yes Current Medication Management: Diabetic Medications:  ACEI/ARB: Yes Statin: Yes Aspirin therapy: No  HTN:  - Home BP 126/88  -doestake medications as prescribed - current regimen includes Olmesartan, metoprolol daily.  -taking medications as instructed, no medication side effects noted, no TIAs, no chest pain on exertion, no dyspnea on exertion, no swelling of ankles, no orthostatic dizziness or lightheadedness; no changes. - DASH diet discussed - ptdoesfollow a low sodium diet; salt shaker not on table - The  followingarecontributing factors: stress, sedentary lifestyle.  Obesity: Weight Management History: She has been working on cutting out carbs.  She notes she is 211lbs today at home - down 20lbs since our last visit.  Doing intermittent fasting - 8 hours of eating, 16 hours fasting.  Exercise:she has [employer] and kickboxing. Co-Morbid Conditions:diabetes mellitus, dyslipidemias and hypertension; 2 or more of these conditions combined with BMI >30 is considered morbid obesity; is this diagnosis appropriate and/or added to patient's problem list?Yes  Thyroid Nodule:Pt reports she has been "cleared" by endocrinology after negative biopsy, reports she will have one follow up with them next year(Due for this in February 2019). FNA of thyroid nodule was benign in February 2019. Denies heat/cold intolerance, no hair/skin/nail changes, or palpitations, no difficulty swallowing.  Altered Awareness/Seizure-like activity/Headaches:Pt was referred and went to see Dr. Melrose Nakayama in December due to multiple episodes of reported altered awareness with unclear etiology. Dr. Melrose Nakayama - she had to transfer to [facility] neuro due to her insurance and is now seeing Dr. Posey Pronto. Had normal EEG in August 2020. She wants to stop her nortriptyline - has only been taking 5mg  for over a week now.  Advised she may stop medication at this time and monitor. Has not needed Maxalt since September 2020, no headaches or migraines.  Chest Pain and Sinus Tachycardia and HLDand LVH/Valvular Regurgitation: Saw Dr. Saunders Revel, was told to only follow up as needed as her Cardiac CTA was clear. She denies chest pain, palpitations, or shortness of breath. Last lipids were May 2020 and showed elevated triglycerides and HDL was low -again discussedlifestyle  changes to help. She is taking meoprolol 50mg daily.times a day.Taking atorvastatin, olmesartan, and metoprolol.Due for labs.  ADHD/GAD/Depression: She sees psychiatry for  ADHD, GAD, and Depression.She is Vybrid and Vyvanse daily; also taking hydroxyzine PRN.  Regimen is working well for her overall.She is going to continue to see psychiatry. No longer taking Lorazepam 1mg . No recent panic attacks, has some crying spells, no SI/HI.   Urge Incontinence: She notes improvement.  She has trouble making it to the bathroom - maybe once everyfew weeks. She has tried [employer]  and these did seem to help. No abdominal pain, no abnormal vaginal bleeding, no hematuria, no dysuria, no bulging or mass in vaginal canal.She has not been doingo timed urination/bladder training.  She declines   Patient Active Problem List   Diagnosis Date Noted  . Urge incontinence 08/15/2018  . Altered awareness, transient 06/17/2018  . Headache disorder 06/17/2018  . Family history of premature CAD 09/11/2017  . Thyroid nodule 08/15/2017  . LVH (left ventricular hypertrophy) 02/01/2017  . Elevated serum glutamic pyruvic transaminase (SGPT) level 07/24/2016  . Morbid obesity ([place]) 03/23/2016  . Sinus tachycardia 02/24/2016  . Valvular regurgitation 01/11/2016  . ADHD (attention deficit hyperactivity disorder), combined type 08/30/2015  . Generalized anxiety disorder 12/31/2014  . Major depressive disorder, recurrent episode ([place]) 10/25/2014  . Diabetes mellitus type 2, controlled, without complications ([place]) [id number]  . Essential hypertension 10/25/2014    Past Surgical History:  Procedure Laterality Date  . CESAREAN SECTION    . thyroid nodule biopsy    . TUBAL LIGATION      Family History  Problem Relation Age of Onset  . Heart disease Mother   . Hyperlipidemia Mother   . Hypertension Mother   . Miscarriages / Stillbirths Mother        2  . Anxiety disorder Mother   . Coronary artery disease Mother 107       11 stents  . Alcohol abuse Father   . Cancer Father        testicular  . Depression Father   . Heart disease Sister   . Hyperlipidemia Sister   .  Hypertension Sister   . Other Sister         Langerhans Cell Histiocytosis  . Drug abuse Brother   . Alcohol abuse Brother   . Depression Brother   . ADD / ADHD Son   . Mental illness Maternal Aunt   . Alcohol abuse Maternal Grandfather   . Depression Maternal Grandfather   . Anxiety disorder Maternal Grandfather   . Cancer Paternal Grandmother   . Alcohol abuse Paternal Grandfather     Social History   Socioeconomic History  . Marital status: Married    Spouse name: Erlene Quan  . Number of children: 3  . Years of education: 2 Masters  . Highest education level: Master's degree (e.g., MA, MS, MEng, MEd, MSW, MBA)  Occupational History  . Not on file  Tobacco Use  . Smoking status: Former Smoker    Types: Cigarettes, E-cigarettes    Quit date: 06/29/2017    Years since quitting: 2.0  . Smokeless tobacco: Never Used  . Tobacco comment: On and off since 51 yrs of age; now using e-cigarettes  Substance and Sexual Activity  . Alcohol use: Yes    Alcohol/week: 0.0 standard drinks    Comment: rare  . Drug use: No  . Sexual activity: Yes    Partners: Male  Other Topics Concern  . Not on file  Social History Narrative  . Not on file   Social Determinants of Health   Financial Resource Strain:   . Difficulty of Paying Living Expenses: Not on file  Food Insecurity:   . Worried About Charity fundraiser in the Last Year: Not on file  . Ran Out of Food in the Last Year: Not on file  Transportation Needs:   . Lack of Transportation (Medical): Not on file  . Lack of Transportation (Non-Medical): Not on file  Physical Activity:   . Days of Exercise per Week: Not on file  . Minutes of Exercise per Session: Not on file  Stress:   . Feeling of Stress : Not on file  Social Connections:   . Frequency of Communication with Friends and Family: Not on file  . Frequency of Social Gatherings with Friends and Family: Not on file  . Attends Religious Services: Not on file  . Active  Member of Clubs or Organizations: Not on file  . Attends Archivist Meetings: Not on file  . Marital Status: Not on file  Intimate Partner Violence:   . Fear of Current or Ex-Partner: Not on file  . Emotionally Abused: Not on file  . Physically Abused: Not on file  . Sexually Abused: Not on file     Current Outpatient Medications:  .  aspirin EC 81 MG tablet, Take 1 tablet (81 mg total) by mouth daily., Disp: , Rfl:  .  atorvastatin (LIPITOR) 20 MG tablet, TAKE 1 TABLET(20 MG) BY MOUTH AT BEDTIME, Disp: 90 tablet, Rfl: 1 .  hydrOXYzine (ATARAX/VISTARIL) 25 MG tablet, Take 25 mg by mouth 3 (three) times daily as needed., Disp: , Rfl:  .  metFORMIN (GLUCOPHAGE) 1000 MG tablet, TAKE 1 TABLET(1000 MG) BY MOUTH TWICE DAILY WITH A MEAL, Disp: 180 tablet, Rfl: 3 .  metoprolol tartrate (LOPRESSOR) 25 MG tablet, TAKE 1 TABLET(25 MG) BY MOUTH TWICE DAILY, Disp: 180 tablet, Rfl: 2 .  nortriptyline (PAMELOR) 10 MG capsule, Take 1 pill at night for one week then increase to 2 pills at night, Disp: , Rfl:  .  olmesartan (BENICAR) 5 MG tablet, TAKE 1 TABLET(5 MG) BY MOUTH DAILY, Disp: 90 tablet, Rfl: 0 .  rizatriptan (MAXALT-MLT) 10 MG disintegrating tablet, DISSOLVE 1 T PO ONCE PRF MIGRAINE. MAY TK A SECOND DOSE AFTER 2 H IF NEEDED, Disp: , Rfl:  .  Semaglutide,0.25 or 0.5MG /DOS, (OZEMPIC, 0.25 OR 0.5 MG/DOSE,) 2 MG/1.5ML SOPN, Inject 0.5 mg into the skin once a week., Disp: 6 mL, Rfl: 3 .  Vilazodone HCl (VIIBRYD) 40 MG TABS, , Disp: , Rfl:  .  VYVANSE 50 MG capsule, Take 50 mg by mouth daily., Disp: , Rfl: 0  Allergies  Allergen Reactions  . Penicillins Anaphylaxis  . Latex Hives and Rash    I personally reviewed active problem list, medication list, allergies, notes from last encounter, lab results with the patient/caregiver today.   ROS  Ten systems reviewed and is negative except as mentioned in HPI  Objective  Virtual encounter, vitals not obtained.  Body mass index is  35.96 kg/m.  Physical Exam  Constitutional: Patient appears well-developed and well-nourished. No distress.  HENT: Head: Normocephalic and atraumatic.  Neck: Normal range of motion. Pulmonary/Chest: Effort normal. No respiratory distress. Speaking in complete sentences Neurological: Pt is alert and oriented to person, place, and time. Coordination, speech and gait are normal.  Psychiatric: Patient has a normal mood and affect. behavior is normal. Judgment and thought  content normal.   No results found for this or any previous visit (from the past 72 hour(s)).  PHQ2/9: Depression screen [facility] 2/9 07/02/2019 03/31/2019 11/14/2018 08/15/2018 05/13/2018  Decreased Interest 0 1 0 0 0  Down, Depressed, Hopeless 0 0 0 0 0  PHQ - 2 Score 0 1 0 0 0  Altered sleeping 0 0 0 0 0  Tired, decreased energy 0 0 0 0 0  Change in appetite 0 1 0 0 0  Feeling bad or failure about yourself  0 0 0 0 0  Trouble concentrating 0 0 0 0 0  Moving slowly or fidgety/restless 0 0 0 0 0  Suicidal thoughts 0 0 0 0 0  PHQ-9 Score 0 2 0 0 0  Difficult doing work/chores Not difficult at all Not difficult at all Not difficult at all Not difficult at all Not difficult at all  Some recent data might be hidden   PHQ-2/9 Result is negative.    Fall Risk: Fall Risk  07/02/2019 03/31/2019 11/14/2018 08/15/2018 05/13/2018  Falls in the past year? 0 0 0 0 No  Number falls in past yr: 0 0 0 0 -  Injury with Fall? 0 0 0 0 -  Follow up - - Falls evaluation completed Falls evaluation completed -    Assessment & Plan  1. Controlled type 2 diabetes mellitus without complication, without long-term current use of insulin ([place]) - Doing well with weight loss and dietary changes. - Hemoglobin A1c - Microalbumin / creatinine urine ratio  2. Essential hypertension - DASH diet. Exercising regularly - COMPLETE METABOLIC PANEL WITH GFR - CBC with Differential/Platelet  3. Morbid obesity ([place]) - Discussed importance of 150 minutes  of physical activity weekly, eat two servings of fish weekly, eat one serving of tree nuts ( cashews, pistachios, pecans, almonds.Marland Kitchen) every other day, eat 6 servings of fruit/vegetables daily and drink plenty of water and avoid sweet beverages.  - Lipid panel - COMPLETE METABOLIC PANEL WITH GFR - Hemoglobin A1c - CBC with Differential/Platelet - Thyroid Panel With TSH  4. Thyroid nodule - Thyroid Panel With TSH  5. Altered awareness, transient - No recent episodes  6. Headache disorder - No recent headaches, is stopping her nortriptyline at this time. - COMPLETE METABOLIC PANEL WITH GFR - CBC with Differential/Platelet  7. Generalized anxiety disorder - COMPLETE METABOLIC PANEL WITH GFR - CBC with Differential/Platelet  8. Mild episode of recurrent major depressive disorder (HCC) - COMPLETE METABOLIC PANEL WITH GFR - CBC with Differential/Platelet  9. ADHD (attention deficit hyperactivity disorder), combined type - Vyvanse, following with pyschiatry.  10. Valvular regurgitation - COMPLETE METABOLIC PANEL WITH GFR - CBC with Differential/Platelet  11. Mixed hyperlipidemia - Lipid panel  12. LVH (left ventricular hypertrophy) - No concerns today; will refer back to cardiology if any concerning symptoms.  14. Urge incontinence - Does not want to see urology at this time.   I discussed the assessment and treatment plan with the patient. The patient was provided an opportunity to ask questions and all were answered. The patient agreed with the plan and demonstrated an understanding of the instructions.   The patient was advised to call back or seek an in-person evaluation if the symptoms worsen or if the condition fails to improve as anticipated.  I provided 26 minutes of non-face-to-face time during this encounter.  Hubbard Hartshorn, FNP

## 2019-08-24 ENCOUNTER — Encounter: Payer: Self-pay | Admitting: Family Medicine

## 2019-08-27 ENCOUNTER — Other Ambulatory Visit: Payer: Self-pay | Admitting: Certified Nurse Midwife

## 2019-08-27 DIAGNOSIS — N6325 Unspecified lump in the left breast, overlapping quadrants: Secondary | ICD-10-CM

## 2019-09-01 ENCOUNTER — Other Ambulatory Visit: Payer: Self-pay | Admitting: Certified Nurse Midwife

## 2019-09-01 DIAGNOSIS — N6325 Unspecified lump in the left breast, overlapping quadrants: Secondary | ICD-10-CM

## 2019-09-07 ENCOUNTER — Ambulatory Visit
Admission: RE | Admit: 2019-09-07 | Discharge: 2019-09-07 | Disposition: A | Discharge status: Home / Self Care | Payer: 59 | Source: Ambulatory Visit | Attending: Certified Nurse Midwife | Admitting: Certified Nurse Midwife

## 2019-09-07 DIAGNOSIS — N6325 Unspecified lump in the left breast, overlapping quadrants: Secondary | ICD-10-CM

## 2019-09-27 ENCOUNTER — Other Ambulatory Visit: Payer: Self-pay | Admitting: Family Medicine

## 2019-09-27 DIAGNOSIS — I10 Essential (primary) hypertension: Secondary | ICD-10-CM

## 2019-09-27 NOTE — Telephone Encounter (Signed)
Requested Prescriptions  Pending Prescriptions Disp Refills  . olmesartan (BENICAR) 5 MG tablet [Pharmacy Med Name: OLMESARTAN MEDOXOMIL 5MG  TABLETS] 90 tablet 0    Sig: TAKE 1 TABLET(5 MG) BY MOUTH DAILY     Cardiovascular:  Angiotensin Receptor Blockers Passed - 09/27/2019  4:05 PM      Passed - Cr in normal range and within 180 days    Creat  Date Value Ref Range Status  03/31/2019 0.81 0.50 - 1.10 mg/dL Final   Creatinine, Urine  Date Value Ref Range Status  05/13/2018 185 20 - 275 mg/dL Final         Passed - K in normal range and within 180 days    Potassium  Date Value Ref Range Status  03/31/2019 4.4 3.5 - 5.3 mmol/L Final  08/18/2014 3.7 3.5 - 5.1 mmol/L Final         Passed - Patient is not pregnant      Passed - Last BP in normal range    BP Readings from Last 1 Encounters:  07/02/19 126/88         Passed - Valid encounter within last 6 months    Recent Outpatient Visits          2 months ago Controlled type 2 diabetes mellitus without complication, without long-term current use of insulin [facility])   [place], Astrid Divine, FNP   6 months ago Controlled type 2 diabetes mellitus without complication, without long-term current use of insulin [facility])   [place], Astrid Divine, FNP   10 months ago Controlled type 2 diabetes mellitus without complication, without long-term current use of insulin [facility])   [place], [place], FNP   1 year ago Controlled type 2 diabetes mellitus with complication, without long-term current use of insulin [facility])   [place], FNP   1 year ago Near syncope   [place], FNP      Future Appointments            In 2 months Hubbard Hartshorn, [facility], [place]   In 2 months Uvaldo Rising, Astrid Divine, [facility], [facility]

## 2019-10-30 ENCOUNTER — Telehealth: Payer: Self-pay | Admitting: Family Medicine

## 2019-10-30 NOTE — Telephone Encounter (Signed)
Copied from [place] [id number]. Topic: General - Other >> Oct 30, 2019 12:26 PM Celene Kras wrote: Reason for CRM: Pt called stating that some of the medications that she is taking based on weight is too much for her. Pt states that she has been losing 2-3 pounds a week and is requesting to know if her medications should be changed. Please advise.

## 2019-11-05 ENCOUNTER — Encounter: Payer: Self-pay | Admitting: Family Medicine

## 2019-11-05 ENCOUNTER — Other Ambulatory Visit: Payer: Self-pay | Admitting: Family Medicine

## 2019-11-05 DIAGNOSIS — E782 Mixed hyperlipidemia: Secondary | ICD-10-CM

## 2019-11-05 DIAGNOSIS — E119 Type 2 diabetes mellitus without complications: Secondary | ICD-10-CM

## 2019-11-06 NOTE — Telephone Encounter (Signed)
MBS

## 2019-11-06 NOTE — Telephone Encounter (Signed)
Spoke with pt and she will wait until her appt to discuss this

## 2019-11-06 NOTE — Telephone Encounter (Signed)
Please make patient appointment 

## 2019-12-09 ENCOUNTER — Ambulatory Visit: Payer: BLUE CROSS/BLUE SHIELD | Admitting: Family Medicine

## 2019-12-09 ENCOUNTER — Ambulatory Visit (INDEPENDENT_AMBULATORY_CARE_PROVIDER_SITE_OTHER): Payer: 59 | Admitting: Family Medicine

## 2019-12-09 ENCOUNTER — Encounter: Payer: Self-pay | Admitting: Family Medicine

## 2019-12-09 ENCOUNTER — Other Ambulatory Visit: Payer: Self-pay

## 2019-12-09 VITALS — BP 120/84 | HR 98 | Temp 96.9°F | Resp 16 | Ht 63.0 in | Wt 186.1 lb

## 2019-12-09 DIAGNOSIS — E669 Obesity, unspecified: Secondary | ICD-10-CM | POA: Insufficient documentation

## 2019-12-09 DIAGNOSIS — E041 Nontoxic single thyroid nodule: Secondary | ICD-10-CM

## 2019-12-09 DIAGNOSIS — I10 Essential (primary) hypertension: Secondary | ICD-10-CM

## 2019-12-09 DIAGNOSIS — E1169 Type 2 diabetes mellitus with other specified complication: Secondary | ICD-10-CM | POA: Diagnosis not present

## 2019-12-09 DIAGNOSIS — F330 Major depressive disorder, recurrent, mild: Secondary | ICD-10-CM

## 2019-12-09 DIAGNOSIS — E1159 Type 2 diabetes mellitus with other circulatory complications: Secondary | ICD-10-CM | POA: Diagnosis not present

## 2019-12-09 DIAGNOSIS — E782 Mixed hyperlipidemia: Secondary | ICD-10-CM

## 2019-12-09 DIAGNOSIS — F902 Attention-deficit hyperactivity disorder, combined type: Secondary | ICD-10-CM

## 2019-12-09 DIAGNOSIS — I517 Cardiomegaly: Secondary | ICD-10-CM

## 2019-12-09 DIAGNOSIS — I152 Hypertension secondary to endocrine disorders: Secondary | ICD-10-CM

## 2019-12-09 LAB — POCT GLYCOSYLATED HEMOGLOBIN (HGB A1C): Hemoglobin A1C: 5.8 % — AB (ref 4.0–5.6)

## 2019-12-09 MED ORDER — LISDEXAMFETAMINE DIMESYLATE 50 MG PO CAPS: 50.0000 mg | ORAL_CAPSULE | Freq: Every day | ORAL | 0 refills | Status: DC

## 2019-12-09 MED ORDER — VIIBRYD 20 MG PO TABS: 1.0000 | ORAL_TABLET | Freq: Every day | ORAL | 1 refills | Status: DC

## 2019-12-09 MED ORDER — VYVANSE 50 MG PO CAPS: 50.0000 mg | ORAL_CAPSULE | Freq: Every day | ORAL | 0 refills | Status: DC

## 2019-12-09 MED ORDER — METOPROLOL SUCCINATE ER 25 MG PO TB24: 25.0000 mg | ORAL_TABLET | Freq: Every day | ORAL | 1 refills | Status: DC

## 2019-12-09 MED ORDER — OLMESARTAN MEDOXOMIL 5 MG PO TABS: 5.0000 mg | ORAL_TABLET | Freq: Every day | ORAL | 1 refills | Status: DC

## 2019-12-09 NOTE — Progress Notes (Addendum)
Name: Melanie Villarreal   MRN: [id number]    DOB: 1976-02-12   Date:12/09/2019       Progress Note  Subjective  Chief Complaint  Chief Complaint  Patient presents with  . Hypertension  . Diabetes  . Hyperlipidemia  . ADHD  . Obesity    HPI  DMII: she has associated hypertension, dyslipidemia, obese, she also has burning pain on both legs. She has been on Ozempic since May 2020 and glucose has been controlled , she states only gets hypoglycemic episodes when she skips meals. She denies polyphagia, polydipsia or polyuria . A1C is down to 5.8 %. We will stop Metformin and if A1C goes up we will adjust dose of Ozempic   HTN: bp is at goal , taking medication, no side effects. Denies chest pain , palpitation or SOB. Discussed teratogenic effects but she had a tubal ligation   Morbid Obesity: she has been obese since age 8. She states worse since the last pregnancy. She states a glutten free diet, walking 2 miles three days a week, snacking on protein smoothies at night. Started diet was May  1st 2020 , her weight was 235 lbs and is down today to 186 lbs . She is doing great.   ADHD: she states symptoms started in childhood, but was treated at age 55 , she has tried Adderal - caused anger outburst, felt tired at the end of the day, Strattera caused sedation, Ritalin caused palpitation. She has been on Vyvanse for the past four   years and is working well for her . 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 Her parents were going through a divorce, mother left and stayed with her step-father, she has one brother that died as an infant and another one that died from drug overdose at age 12 ( she was  76 ). There is a family history of depression. She was on Zoloft from age 74 until year 2019 when she was switched from Zoloft to [place] because she had a relapse. ( she also states while on  zoloft tried some other SSRI - not sure of the name). She has been doing well on Vibrid. 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 She stopped seeing psychiatrist because of turn over of providers.    Patient Active Problem List   Diagnosis Date Noted  . Hypertension associated with type 2 diabetes mellitus ([place]) 12/09/2019  . Diabetes mellitus type 2 in obese ([place]) 12/09/2019  . Hyperlipidemia associated with type 2 diabetes mellitus ([place]) 07/02/2019  . Urge incontinence 08/15/2018  . Altered awareness, transient 06/17/2018  . Headache disorder 06/17/2018  . Family history of premature CAD 09/11/2017  . Thyroid nodule 08/15/2017  . LVH (left ventricular hypertrophy) 02/01/2017  . Elevated serum glutamic pyruvic transaminase (SGPT) level 07/24/2016  . Morbid obesity ([place]) 03/23/2016  . Sinus tachycardia 02/24/2016  . Valvular regurgitation 01/11/2016  . ADHD (attention deficit hyperactivity disorder), combined type 08/30/2015  . Generalized anxiety disorder 12/31/2014  . Major depressive disorder, recurrent episode ([place]) 10/25/2014  . Essential hypertension 10/25/2014    Past Surgical History:  Procedure Laterality Date  . CESAREAN SECTION    . thyroid nodule biopsy    . TUBAL LIGATION      Family History  Problem Relation Age of Onset  .  Heart disease Mother   . Hyperlipidemia Mother   . Hypertension Mother   . Miscarriages / Stillbirths Mother        2  . Anxiety disorder Mother   . Coronary artery disease Mother 62       11 stents  . Alcohol abuse Father   . Cancer Father        testicular  . Depression Father   . Heart disease Sister   . Hyperlipidemia Sister   . Hypertension Sister   . Other Sister         Langerhans Cell Histiocytosis  . Drug abuse Brother   . Alcohol abuse Brother   . Depression Brother   . ADD / ADHD Son   . Mental illness Maternal Aunt   . Alcohol abuse Maternal Grandfather    . Depression Maternal Grandfather   . Anxiety disorder Maternal Grandfather   . Cancer Paternal Grandmother   . Breast cancer Paternal Grandmother        late 10's  . Alcohol abuse Paternal Grandfather     Social History   Tobacco Use  . Smoking status: Former Smoker    Types: Cigarettes, E-cigarettes    Quit date: 06/29/2017    Years since quitting: 2.4  . Smokeless tobacco: Never Used  . Tobacco comment: On and off since 40 yrs of age; now using e-cigarettes  Substance Use Topics  . Alcohol use: Yes    Alcohol/week: 0.0 standard drinks    Comment: rare     Current Outpatient Medications:  .  aspirin EC 81 MG tablet, Take 1 tablet (81 mg total) by mouth daily., Disp: , Rfl:  .  atorvastatin (LIPITOR) 20 MG tablet, TAKE 1 TABLET(20 MG) BY MOUTH AT BEDTIME, Disp: 90 tablet, Rfl: 2 .  hydrOXYzine (ATARAX/VISTARIL) 25 MG tablet, Take 25 mg by mouth 3 (three) times daily as needed., Disp: , Rfl:  .  olmesartan (BENICAR) 5 MG tablet, Take 1 tablet (5 mg total) by mouth daily., Disp: 90 tablet, Rfl: 1 .  rizatriptan (MAXALT-MLT) 10 MG disintegrating tablet, DISSOLVE 1 T PO ONCE PRF MIGRAINE. MAY TK A SECOND DOSE AFTER 2 H IF NEEDED, Disp: , Rfl:  .  Semaglutide,0.25 or 0.5MG /DOS, (OZEMPIC, 0.25 OR 0.5 MG/DOSE,) 2 MG/1.5ML SOPN, Inject 0.5 mg into the skin once a week., Disp: 6 mL, Rfl: 3 .  VYVANSE 50 MG capsule, Take 1 capsule (50 mg total) by mouth daily., Disp: 30 capsule, Rfl: 0 .  lisdexamfetamine (VYVANSE) 50 MG capsule, Take 1 capsule (50 mg total) by mouth daily., Disp: 30 capsule, Rfl: 0 .  lisdexamfetamine (VYVANSE) 50 MG capsule, Take 1 capsule (50 mg total) by mouth daily., Disp: 30 capsule, Rfl: 0 .  metoprolol succinate (TOPROL-XL) 25 MG 24 hr tablet, Take 1 tablet (25 mg total) by mouth daily., Disp: 90 tablet, Rfl: 1 .  Vilazodone HCl (VIIBRYD) 20 MG TABS, Take 1 tablet (20 mg total) by mouth daily., Disp: 90 tablet, Rfl: 1  Allergies  Allergen Reactions  .  Penicillins Anaphylaxis  . Latex Hives and Rash    I personally reviewed active problem list, medication list, allergies, family history, social history, health maintenance with the patient/caregiver today.   ROS  Constitutional: Negative for fever, positive weight change.  Respiratory: Negative for cough and shortness of breath.   Cardiovascular: Negative for chest pain or palpitations.  Gastrointestinal: Negative for abdominal pain, no bowel changes.  Musculoskeletal: Negative for gait problem or joint swelling.  Skin: Negative for rash.  Neurological: Negative for dizziness or headache.  No other specific complaints in a complete review of systems (except as listed in HPI above).  Objective  Vitals:   12/09/19 0916  BP: 120/84  Pulse: 98  Resp: 16  Temp: (!) 96.9 F (36.1 C)  TempSrc: Temporal  SpO2: 98%  Weight: 186 lb 1.6 oz (84.4 kg)  Height: 5\' 3"  (1.6 m)    Body mass index is 32.97 kg/m.  Physical Exam  Constitutional: Patient appears well-developed and well-nourished. Obese  No distress.  HEENT: head atraumatic, normocephalic, pupils equal and reactive to light, neck supple Cardiovascular: Normal rate, regular rhythm and normal heart sounds.  No murmur heard. No BLE edema. Pulmonary/Chest: Effort normal and breath sounds normal. No respiratory distress. Abdominal: Soft.  There is no tenderness. Psychiatric: Patient has a normal mood and affect. behavior is normal. Judgment and thought content normal.  Recent Results (from the past 2160 hour(s))  POCT HgB A1C     Status: Abnormal   Collection Time: 12/09/19  9:32 AM  Result Value Ref Range   Hemoglobin A1C 5.8 (A) 4.0 - 5.6 %   HbA1c POC (<> result, manual entry)     HbA1c, POC (prediabetic range)     HbA1c, POC (controlled diabetic range)      Diabetic Foot Exam: Diabetic Foot Exam - Simple   Simple Foot Form Diabetic Foot exam was performed with the following findings: Yes 12/09/2019  9:53 AM   Visual Inspection No deformities, no ulcerations, no other skin breakdown bilaterally: Yes Sensation Testing Intact to touch and monofilament testing bilaterally: Yes Pulse Check Posterior Tibialis and Dorsalis pulse intact bilaterally: Yes Comments     PHQ2/9: Depression screen [facility] 2/9 12/09/2019 12/09/2019 07/02/2019 03/31/2019 11/14/2018  Decreased Interest 0 0 0 1 0  Down, Depressed, Hopeless 0 0 0 0 0  PHQ - 2 Score 0 0 0 1 0  Altered sleeping 0 0 0 0 0  Tired, decreased energy 0 0 0 0 0  Change in appetite 0 0 0 1 0  Feeling bad or failure about yourself  0 0 0 0 0  Trouble concentrating 1 0 0 0 0  Moving slowly or fidgety/restless 0 0 0 0 0  Suicidal thoughts 0 0 0 0 0  PHQ-9 Score 1 0 0 2 0  Difficult doing work/chores Somewhat difficult - Not difficult at all Not difficult at all Not difficult at all  Some recent data might be hidden    phq 9 is negative   Fall Risk: Fall Risk  12/09/2019 07/02/2019 03/31/2019 11/14/2018 08/15/2018  Falls in the past year? 0 0 0 0 0  Number falls in past yr: 0 0 0 0 0  Injury with Fall? 0 0 0 0 0  Follow up - - - Falls evaluation completed Falls evaluation completed     Functional Status Survey: Is the patient deaf or have difficulty hearing?: No Does the patient have difficulty seeing, even when wearing glasses/contacts?: No Does the patient have difficulty concentrating, remembering, or making decisions?: No Does the patient have difficulty walking or climbing stairs?: No Does the patient have difficulty dressing or bathing?: No Does the patient have difficulty doing errands alone such as visiting a doctor's office or shopping?: No   Assessment & Plan  1. Hypertension associated with diabetes ([place])  - POCT HgB A1C - CBC with Differential/Platelet - COMPLETE METABOLIC PANEL WITH GFR  2. Diabetes mellitus type 2 in  obese (HCC)  - Microalbumin / creatinine urine ratio  3. Essential hypertension  - metoprolol succinate  (TOPROL-XL) 25 MG 24 hr tablet; Take 1 tablet (25 mg total) by mouth daily.  Dispense: 90 tablet; Refill: 1 - olmesartan (BENICAR) 5 MG tablet; Take 1 tablet (5 mg total) by mouth daily.  Dispense: 90 tablet; Refill: 1  4. Morbid obesity ([place])  Discussed with the patient the risk posed by an increased BMI. Discussed importance of portion control, calorie counting and at least 150 minutes of physical activity weekly. Avoid sweet beverages and drink more water. Eat at least 6 servings of fruit and vegetables daily    5. ADHD (attention deficit hyperactivity disorder), combined type  - VYVANSE 50 MG capsule; Take 1 capsule (50 mg total) by mouth daily.  Dispense: 30 capsule; Refill: 0 - lisdexamfetamine (VYVANSE) 50 MG capsule; Take 1 capsule (50 mg total) by mouth daily.  Dispense: 30 capsule; Refill: 0 - lisdexamfetamine (VYVANSE) 50 MG capsule; Take 1 capsule (50 mg total) by mouth daily.  Dispense: 30 capsule; Refill: 0  6. Mild episode of recurrent major depressive disorder (HCC)  - Vilazodone HCl (VIIBRYD) 20 MG TABS; Take 1 tablet (20 mg total) by mouth daily.  Dispense: 90 tablet; Refill: 1  7. Mixed hyperlipidemia  - Lipid panel  8. Thyroid nodule  - Thyroid Panel With TSH  9. LVH (left ventricular hypertrophy)  - metoprolol succinate (TOPROL-XL) 25 MG 24 hr tablet; Take 1 tablet (25 mg total) by mouth daily.  Dispense: 90 tablet; Refill: 1

## 2019-12-10 LAB — COMPLETE METABOLIC PANEL WITH GFR
AG Ratio: 2 (calc) (ref 1.0–2.5)
ALT: 27 U/L (ref 6–29)
AST: 17 U/L (ref 10–30)
Albumin: 4.5 g/dL (ref 3.6–5.1)
Alkaline phosphatase (APISO): 69 U/L (ref 31–125)
BUN: 13 mg/dL (ref 7–25)
CO2: 29 mmol/L (ref 20–32)
Calcium: 9.4 mg/dL (ref 8.6–10.2)
Chloride: 105 mmol/L (ref 98–110)
Creat: 0.68 mg/dL (ref 0.50–1.10)
GFR, Est African American: 126 mL/min/{1.73_m2} (ref >=60)
GFR, Est Non African American: 109 mL/min/{1.73_m2} (ref >=60)
Globulin: 2.2 g/dL (calc) (ref 1.9–3.7)
Glucose, Bld: 101 mg/dL — ABNORMAL HIGH (ref 65–99)
Potassium: 4.5 mmol/L (ref 3.5–5.3)
Sodium: 140 mmol/L (ref 135–146)
Total Bilirubin: 1.2 mg/dL (ref 0.2–1.2)
Total Protein: 6.7 g/dL (ref 6.1–8.1)

## 2019-12-10 LAB — THYROID PANEL WITH TSH
Free Thyroxine Index: 2.1 (ref 1.4–3.8)
T3 Uptake: 30 % (ref 22–35)
T4, Total: 7.1 ug/dL (ref 5.1–11.9)
TSH: 1.21 mIU/L

## 2019-12-10 LAB — CBC WITH DIFFERENTIAL/PLATELET
Absolute Monocytes: 626 cells/uL (ref 200–950)
Basophils Absolute: 35 cells/uL (ref 0–200)
Basophils Relative: 0.3 %
Eosinophils Absolute: 151 cells/uL (ref 15–500)
Eosinophils Relative: 1.3 %
HCT: 43.3 % (ref 35.0–45.0)
Hemoglobin: 14.7 g/dL (ref 11.7–15.5)
Lymphs Abs: 3782 cells/uL (ref 850–3900)
MCH: 29.7 pg (ref 27.0–33.0)
MCHC: 33.9 g/dL (ref 32.0–36.0)
MCV: 87.5 fL (ref 80.0–100.0)
MPV: 11 fL (ref 7.5–12.5)
Monocytes Relative: 5.4 %
Neutro Abs: 7006 cells/uL (ref 1500–7800)
Neutrophils Relative %: 60.4 %
Platelets: 374 10*3/uL (ref 140–400)
RBC: 4.95 10*6/uL (ref 3.80–5.10)
RDW: 12.8 % (ref 11.0–15.0)
Total Lymphocyte: 32.6 %
WBC: 11.6 10*3/uL — ABNORMAL HIGH (ref 3.8–10.8)

## 2019-12-10 LAB — LIPID PANEL
Cholesterol: 108 mg/dL (ref <200)
HDL: 33 mg/dL — ABNORMAL LOW (ref >=50)
LDL Cholesterol (Calc): 52 mg/dL (calc)
Non-HDL Cholesterol (Calc): 75 mg/dL (calc) (ref <130)
Total CHOL/HDL Ratio: 3.3 (calc) (ref <5.0)
Triglycerides: 157 mg/dL — ABNORMAL HIGH (ref <150)

## 2019-12-10 LAB — MICROALBUMIN / CREATININE URINE RATIO
Creatinine, Urine: 194 mg/dL (ref 20–275)
Microalb Creat Ratio: 11 mcg/mg creat (ref <30)
Microalb, Ur: 2.1 mg/dL

## 2019-12-17 ENCOUNTER — Ambulatory Visit: Payer: BLUE CROSS/BLUE SHIELD | Admitting: Family Medicine

## 2020-02-03 ENCOUNTER — Other Ambulatory Visit: Payer: Self-pay

## 2020-02-03 DIAGNOSIS — E119 Type 2 diabetes mellitus without complications: Secondary | ICD-10-CM

## 2020-02-04 MED ORDER — OZEMPIC (0.25 OR 0.5 MG/DOSE) 2 MG/1.5ML ~~LOC~~ SOPN: 0.5000 mg | PEN_INJECTOR | SUBCUTANEOUS | 0 refills | Status: DC

## 2020-02-23 ENCOUNTER — Other Ambulatory Visit: Payer: Self-pay | Admitting: Certified Nurse Midwife

## 2020-03-05 ENCOUNTER — Encounter: Payer: Self-pay | Admitting: Family Medicine

## 2020-03-06 ENCOUNTER — Other Ambulatory Visit: Payer: Self-pay | Admitting: Family Medicine

## 2020-03-06 DIAGNOSIS — F902 Attention-deficit hyperactivity disorder, combined type: Secondary | ICD-10-CM

## 2020-03-06 DIAGNOSIS — E119 Type 2 diabetes mellitus without complications: Secondary | ICD-10-CM

## 2020-03-07 MED ORDER — OZEMPIC (0.25 OR 0.5 MG/DOSE) 2 MG/1.5ML ~~LOC~~ SOPN: 0.5000 mg | PEN_INJECTOR | SUBCUTANEOUS | 0 refills | Status: DC

## 2020-03-07 MED ORDER — HYDROXYZINE HCL 25 MG PO TABS: 25.0000 mg | ORAL_TABLET | Freq: Three times a day (TID) | ORAL | 0 refills | Status: DC | PRN

## 2020-03-10 NOTE — Progress Notes (Signed)
Name: Melanie Villarreal   MRN: [id number]    DOB: [birth year]   Date:03/11/2020       Progress Note  Subjective  Chief Complaint  Chief Complaint  Patient presents with  . Diabetes  . Hypertension  . Hyperlipidemia  . Medication Refill  . Obesity  . ADHD    HPI  DMII: she has associated hypertension, dyslipidemia, obese, she also has burning pain on both legs. She has been on Ozempic since May 2020 and glucose has been controlled , she states only gets hypoglycemic episodes when she skips meals. She denies polyphagia, polydipsia or polyuria . A1C is still at 5.8 %. She stopped Metformin but gained 7 lbs, we will adjust dose of Ozempic.   HTN: bp is at goal , taking medication, no side effects. Denies chest pain , palpitation or SOB. BP is at goal   Morbid Obesity: she has been obese since age 32. She states worse since the last pregnancy. She was on a  glutten free diet, walking 2 miles three days a week, snacking on protein smoothies at night. Started diet was May  1st 2020 , her weight was 235 lbs and she was  down to 186 lbs .  She gained 7 lbs since last visit , states her kids are working and she has not been active lately, but she will resume it now   ADHD: she states symptoms started in childhood, but was treated at age 75 , she has tried Adderal - caused anger outburst, felt tired at the end of the day, Strattera caused sedation, Ritalin caused palpitation. She has been on Vyvanse for the past four years and is working well for her . No side effects of medication, except is helps a little on her appetite She states her boss notices when she does not take the medication. She states it helps her stay on task   MDD: she was diagnosed by a psychiatrist when she was 45 yo with cyclothymia. Her parents were going through a divorce, mother left and stayed with her step-father, she has one brother that died as an infant and another one that died from drug overdose at age 73 ( she was  14 ).  There is a family history of depression. She was on Zoloft from age 21 until year 2019 when she was switched from Zoloft to [place] because she had a relapse. ( she also states while on zoloft tried some other SSRI - not sure of the name). She has been doing well on Vibrid. 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 She stopped seeing psychiatrist because of turn over of providers. She states past week she felt a little teary but it was during her cycle, mood has been stable, denies suicidal thoughts or ideation      Patient Active Problem List   Diagnosis Date Noted  . Hypertension associated with type 2 diabetes mellitus ([place]) 12/09/2019  . Diabetes mellitus type 2 in obese ([place]) 12/09/2019  . Hyperlipidemia associated with type 2 diabetes mellitus ([place]) 07/02/2019  . Urge incontinence 08/15/2018  . Altered awareness, transient 06/17/2018  . Headache disorder 06/17/2018  . Family history of premature CAD 09/11/2017  . Thyroid nodule 08/15/2017  . LVH (left ventricular hypertrophy) 02/01/2017  . Elevated serum glutamic pyruvic transaminase (SGPT) level 07/24/2016  . Morbid obesity ([place]) 03/23/2016  . Sinus tachycardia 02/24/2016  . Valvular regurgitation 01/11/2016  . ADHD (attention deficit  hyperactivity disorder), combined type 08/30/2015  . Generalized anxiety disorder 12/31/2014  . Major depressive disorder, recurrent episode ([place]) 10/25/2014  . Essential hypertension 10/25/2014    Past Surgical History:  Procedure Laterality Date  . CESAREAN SECTION    . thyroid nodule biopsy    . TUBAL LIGATION      Family History  Problem Relation Age of Onset  . Heart disease Mother   . Hyperlipidemia Mother   . Hypertension Mother   . Miscarriages / Stillbirths Mother        2  . Anxiety disorder Mother   . Coronary artery disease Mother 51       11 stents  . Alcohol abuse Father   . Cancer Father        testicular  .  Depression Father   . Heart disease Sister   . Hyperlipidemia Sister   . Hypertension Sister   . Other Sister         Langerhans Cell Histiocytosis  . Drug abuse Brother   . Alcohol abuse Brother   . Depression Brother   . ADD / ADHD Son   . Mental illness Maternal Aunt   . Alcohol abuse Maternal Grandfather   . Depression Maternal Grandfather   . Anxiety disorder Maternal Grandfather   . Cancer Paternal Grandmother   . Breast cancer Paternal Grandmother        late 26's  . Alcohol abuse Paternal Grandfather     Social History   Tobacco Use  . Smoking status: Former Smoker    Types: Cigarettes, E-cigarettes    Quit date: 06/29/2017    Years since quitting: 2.7  . Smokeless tobacco: Never Used  . Tobacco comment: On and off since 48 yrs of age; now using e-cigarettes  Substance Use Topics  . Alcohol use: Yes    Alcohol/week: 0.0 standard drinks    Comment: rare     Current Outpatient Medications:  .  aspirin EC 81 MG tablet, Take 1 tablet (81 mg total) by mouth daily., Disp: , Rfl:  .  atorvastatin (LIPITOR) 20 MG tablet, TAKE 1 TABLET(20 MG) BY MOUTH AT BEDTIME, Disp: 90 tablet, Rfl: 2 .  hydrOXYzine (ATARAX/VISTARIL) 50 MG tablet, Take 1 tablet (50 mg total) by mouth at bedtime., Disp: 90 tablet, Rfl: 0 .  lisdexamfetamine (VYVANSE) 50 MG capsule, Take 1 capsule (50 mg total) by mouth daily., Disp: 30 capsule, Rfl: 0 .  lisdexamfetamine (VYVANSE) 50 MG capsule, Take 1 capsule (50 mg total) by mouth daily., Disp: 30 capsule, Rfl: 0 .  metoprolol succinate (TOPROL-XL) 25 MG 24 hr tablet, Take 1 tablet (25 mg total) by mouth daily., Disp: 90 tablet, Rfl: 1 .  olmesartan (BENICAR) 5 MG tablet, Take 1 tablet (5 mg total) by mouth daily., Disp: 90 tablet, Rfl: 1 .  rizatriptan (MAXALT-MLT) 10 MG disintegrating tablet, DISSOLVE 1 T PO ONCE PRF MIGRAINE. MAY TK A SECOND DOSE AFTER 2 H IF NEEDED, Disp: , Rfl:  .  Vilazodone HCl (VIIBRYD) 20 MG TABS, Take 1 tablet (20 mg total) by  mouth daily., Disp: 90 tablet, Rfl: 1 .  VYVANSE 50 MG capsule, Take 1 capsule (50 mg total) by mouth daily., Disp: 30 capsule, Rfl: 0 .  Semaglutide, 1 MG/DOSE, (OZEMPIC, 1 MG/DOSE,) 2 MG/1.5ML SOPN, Inject 0.75 mLs (1 mg total) into the skin once a week., Disp: 3 mL, Rfl: 2  Allergies  Allergen Reactions  . Penicillins Anaphylaxis  . Latex Hives and Rash  I personally reviewed active problem list, medication list, allergies, family history, social history, health maintenance with the patient/caregiver today.   ROS  Constitutional: Negative for fever , positive for  weight change - she just had her cycle .  Respiratory: Negative for cough and shortness of breath.   Cardiovascular: Negative for chest pain or palpitations.  Gastrointestinal: Negative for abdominal pain, no bowel changes.  Musculoskeletal: Negative for gait problem or joint swelling.  Skin: Negative for rash.  Neurological: Negative for dizziness or headache.  No other specific complaints in a complete review of systems (except as listed in HPI above).  Objective   Vitals:   03/11/20 1027  BP: 96/60  Pulse: 93  Resp: 16  Temp: 98.5 F (36.9 C)  TempSrc: Oral  SpO2: 99%  Weight: 193 lb (87.5 kg)  Height: 5\' 3"  (1.6 m)    Body mass index is 34.19 kg/m.  Physical Exam  Constitutional: Patient appears well-developed and well-nourished. Obese  No distress.  HEENT: head atraumatic, normocephalic, pupils equal and reactive to light, neck supple Cardiovascular: Normal rate, regular rhythm and normal heart sounds.  No murmur heard. No BLE edema. Pulmonary/Chest: Effort normal and breath sounds normal. No respiratory distress. Abdominal: Soft.  There is no tenderness. Psychiatric: Patient has a normal mood and affect. behavior is normal. Judgment and thought content normal.   Recent Results (from the past 2160 hour(s))  POCT HgB A1C     Status: Abnormal   Collection Time: 03/11/20  9:57 AM  Result Value  Ref Range   Hemoglobin A1C 5.8 (A) 4.0 - 5.6 %   HbA1c POC (<> result, manual entry)     HbA1c, POC (prediabetic range)     HbA1c, POC (controlled diabetic range)      PHQ2/9: Depression screen [facility] 2/9 03/11/2020 12/09/2019 12/09/2019 07/02/2019 03/31/2019  Decreased Interest 0 0 0 0 1  Down, Depressed, Hopeless 0 0 0 0 0  PHQ - 2 Score 0 0 0 0 1  Altered sleeping 0 0 0 0 0  Tired, decreased energy 0 0 0 0 0  Change in appetite 0 0 0 0 1  Feeling bad or failure about yourself  0 0 0 0 0  Trouble concentrating 0 1 0 0 0  Moving slowly or fidgety/restless 0 0 0 0 0  Suicidal thoughts 0 0 0 0 0  PHQ-9 Score 0 1 0 0 2  Difficult doing work/chores - Somewhat difficult - Not difficult at all Not difficult at all  Some recent data might be hidden    phq 9 is negative   Fall Risk: Fall Risk  03/11/2020 12/09/2019 07/02/2019 03/31/2019 11/14/2018  Falls in the past year? 0 0 0 0 0  Number falls in past yr: 0 0 0 0 0  Injury with Fall? 0 0 0 0 0  Follow up - - - - Falls evaluation completed    Functional Status Survey: Is the patient deaf or have difficulty hearing?: No Does the patient have difficulty seeing, even when wearing glasses/contacts?: No Does the patient have difficulty concentrating, remembering, or making decisions?: No Does the patient have difficulty walking or climbing stairs?: No Does the patient have difficulty dressing or bathing?: No Does the patient have difficulty doing errands alone such as visiting a doctor's office or shopping?: No   Assessment & Plan  1. Diabetes mellitus type 2 in obese (HCC)  - POCT HgB A1C  2. Hypertension associated with diabetes ([place])  Taking medication  and doing well   3. Essential hypertension   4. LVH (left ventricular hypertrophy)   5. ADHD (attention deficit hyperactivity disorder), combined type  - lisdexamfetamine (VYVANSE) 50 MG capsule; Take 1 capsule (50 mg total) by mouth daily.  Dispense: 30 capsule; Refill: 0 -  lisdexamfetamine (VYVANSE) 50 MG capsule; Take 1 capsule (50 mg total) by mouth daily.  Dispense: 30 capsule; Refill: 0 - VYVANSE 50 MG capsule; Take 1 capsule (50 mg total) by mouth daily.  Dispense: 30 capsule; Refill: 0  6. Morbid obesity ([place])  Discussed with the patient the risk posed by an increased BMI. Discussed importance of portion control, calorie counting and at least 150 minutes of physical activity weekly. Avoid sweet beverages and drink more water. Eat at least 6 servings of fruit and vegetables daily

## 2020-03-11 ENCOUNTER — Encounter: Payer: Self-pay | Admitting: Family Medicine

## 2020-03-11 ENCOUNTER — Other Ambulatory Visit: Payer: Self-pay

## 2020-03-11 ENCOUNTER — Ambulatory Visit: Payer: 59 | Admitting: Family Medicine

## 2020-03-11 VITALS — BP 96/60 | HR 93 | Temp 98.5°F | Resp 16 | Ht 63.0 in | Wt 193.0 lb

## 2020-03-11 DIAGNOSIS — E669 Obesity, unspecified: Secondary | ICD-10-CM | POA: Diagnosis not present

## 2020-03-11 DIAGNOSIS — I517 Cardiomegaly: Secondary | ICD-10-CM

## 2020-03-11 DIAGNOSIS — E1159 Type 2 diabetes mellitus with other circulatory complications: Secondary | ICD-10-CM | POA: Diagnosis not present

## 2020-03-11 DIAGNOSIS — F902 Attention-deficit hyperactivity disorder, combined type: Secondary | ICD-10-CM

## 2020-03-11 DIAGNOSIS — I10 Essential (primary) hypertension: Secondary | ICD-10-CM

## 2020-03-11 DIAGNOSIS — E1169 Type 2 diabetes mellitus with other specified complication: Secondary | ICD-10-CM | POA: Diagnosis not present

## 2020-03-11 DIAGNOSIS — I152 Hypertension secondary to endocrine disorders: Secondary | ICD-10-CM

## 2020-03-11 LAB — POCT GLYCOSYLATED HEMOGLOBIN (HGB A1C): Hemoglobin A1C: 5.8 % — AB (ref 4.0–5.6)

## 2020-03-11 MED ORDER — LISDEXAMFETAMINE DIMESYLATE 50 MG PO CAPS: 50.0000 mg | ORAL_CAPSULE | Freq: Every day | ORAL | 0 refills | Status: DC

## 2020-03-11 MED ORDER — HYDROXYZINE HCL 50 MG PO TABS: 50.0000 mg | ORAL_TABLET | Freq: Every evening | ORAL | 0 refills | Status: DC

## 2020-03-11 MED ORDER — OZEMPIC (1 MG/DOSE) 2 MG/1.5ML ~~LOC~~ SOPN: 1.0000 mg | PEN_INJECTOR | SUBCUTANEOUS | 2 refills | Status: DC

## 2020-03-11 MED ORDER — VYVANSE 50 MG PO CAPS: 50.0000 mg | ORAL_CAPSULE | Freq: Every day | ORAL | 0 refills | Status: DC

## 2020-05-09 ENCOUNTER — Encounter: Payer: Self-pay | Admitting: Family Medicine

## 2020-05-10 ENCOUNTER — Other Ambulatory Visit: Payer: Self-pay | Admitting: Family Medicine

## 2020-05-10 DIAGNOSIS — R928 Other abnormal and inconclusive findings on diagnostic imaging of breast: Secondary | ICD-10-CM

## 2020-05-11 ENCOUNTER — Telehealth: Payer: Self-pay

## 2020-05-11 NOTE — Telephone Encounter (Signed)
Sent patient a my chart message in regards to referral.

## 2020-05-11 NOTE — Telephone Encounter (Signed)
Copied from [place] [id number]. Topic: Referral - Request for Referral >> May 06, 2020  1:49 PM Rainey Pines A wrote: Patient stated that [facility] needs PCP to send over order for pateint to do her 6 month follow up. Patient is also needing a referral placed for Dr. Alfonse Ras office in regards to her thryoid. Please advise

## 2020-05-16 ENCOUNTER — Other Ambulatory Visit: Payer: Self-pay | Admitting: Family Medicine

## 2020-05-16 DIAGNOSIS — I517 Cardiomegaly: Secondary | ICD-10-CM

## 2020-05-16 DIAGNOSIS — I10 Essential (primary) hypertension: Secondary | ICD-10-CM

## 2020-05-16 NOTE — Telephone Encounter (Signed)
Requested Prescriptions  Pending Prescriptions Disp Refills   metoprolol succinate (TOPROL-XL) 25 MG 24 hr tablet [Pharmacy Med Name: METOPROLOL ER SUCCINATE 25MG  TABS] 90 tablet 0    Sig: TAKE 1 TABLET(25 MG) BY MOUTH DAILY     Cardiovascular:  Beta Blockers Passed - 05/16/2020  9:10 AM      Passed - Last BP in normal range    BP Readings from Last 1 Encounters:  03/11/20 96/60         Passed - Last Heart Rate in normal range    Pulse Readings from Last 1 Encounters:  03/11/20 93         Passed - Valid encounter within last 6 months    Recent Outpatient Visits          2 months ago Diabetes mellitus type 2 in obese [facility])   [facility] Steele Sizer, MD   5 months ago Hypertension associated with diabetes [facility])   [facility] Steele Sizer, MD   10 months ago Controlled type 2 diabetes mellitus without complication, without long-term current use of insulin [facility])   [place], Astrid Divine, FNP   1 year ago Controlled type 2 diabetes mellitus without complication, without long-term current use of insulin [facility])   [place], Astrid Divine, FNP   1 year ago Controlled type 2 diabetes mellitus without complication, without long-term current use of insulin [facility])   [place], Astrid Divine, [place]      Future Appointments            In 1 week Steele Sizer, MD [facility]            olmesartan (BENICAR) 5 MG tablet [Pharmacy Med Name: OLMESARTAN MEDOXOMIL 5MG  TABLETS] 90 tablet 0    Sig: TAKE 1 TABLET(5 MG) BY MOUTH DAILY     Cardiovascular:  Angiotensin Receptor Blockers Passed - 05/16/2020  9:10 AM      Passed - Cr in normal range and within 180 days    Creat  Date Value Ref Range Status  12/09/2019 0.68 0.50 - 1.10 mg/dL Final   Creatinine, Urine  Date Value Ref Range Status  12/09/2019 194 20 - 275 mg/dL Final         Passed - K in  normal range and within 180 days    Potassium  Date Value Ref Range Status  12/09/2019 4.5 3.5 - 5.3 mmol/L Final  08/18/2014 3.7 3.5 - 5.1 mmol/L Final         Passed - Patient is not pregnant      Passed - Last BP in normal range    BP Readings from Last 1 Encounters:  03/11/20 96/60         Passed - Valid encounter within last 6 months    Recent Outpatient Visits          2 months ago Diabetes mellitus type 2 in obese [facility])   [facility] Steele Sizer, MD   5 months ago Hypertension associated with diabetes [facility])   [facility] Steele Sizer, MD   10 months ago Controlled type 2 diabetes mellitus without complication, without long-term current use of insulin [facility])   [place], [place], FNP   1 year ago Controlled type 2 diabetes mellitus without complication, without long-term current use of insulin [facility])   [place],  Astrid Divine, FNP   1 year ago Controlled type 2 diabetes mellitus without complication, without long-term current use of insulin [facility])   [place], [place]      Future Appointments            In 1 week Steele Sizer, MD [facility], [facility]

## 2020-05-24 NOTE — Progress Notes (Signed)
Name: Melanie Villarreal   MRN: [id number]    DOB: Nov 29, 1975   Date:05/25/2020       Progress Note  Subjective  Chief Complaint  Follow up   HPI   DMII: she has associated hypertension, dyslipidemia, obese, she also has neuropahty. She has been on Ozempic since May 2020 and glucose has been controlled , she states only gets hypoglycemic episodes when she skips meals, only happened once since last week . She denies polyphagia, polydipsia or polyuria . A1C today is  6.1 % We adjusted dose of Ozempic on her last visit, A1C still at goal.    HTN: bp is elevated today  taking medication, no side effects. Denies chest pain , palpitation or SOB. We will adjust dose of Benicar to 10 mg daily and if remains above 140/90 she can go up to 20 mg   Morbid Obesity: she has been obese since age 47. She states worse since the last pregnancy. She was on a  glutten free diet, chooses low carb choices,  walking 2 miles three days a week, snacking on protein smoothies at night. Started diet was May 1st  2020 , her weight was 235 lbs and she was  down to 186 lbs  Weight is stable since last visit   ADHD: she states symptoms started in childhood, but was treated at age 24 , she has tried Adderal - caused anger outburst, felt tired at the end of the day, Strattera caused sedation, Ritalin caused palpitation. She has been on Vyvanse for the past four years and is working well for her . No side effects of medication, except is helps a little on her appetite She states her boss notices when she does not take the medication. She states her husband and children also noticed right away when she skips the dose. 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 Her parents were going through a divorce, mother left and stayed with her step-father, she has one brother that died as an infant and another one that died from drug overdose at age 54 ( she was  46 ).  There is a family history of depression. She was on Zoloft from age 59 until year 2017/10/08 when she was switched from Zoloft to [place] because she had a relapse. ( she also states while on zoloft tried some other SSRI - not sure of the name). She has been doing well on Vibrid. 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 She stopped seeing psychiatrist because of turn over of providers. She is feeling a little down this week since her brother that died from cocaine overdose died in October 08, 2012, and he would be turning 40 this upcoming weekend   Migraine headaches: she states seldom has episodes, she states she has a tingling sensation on her scalp followed by a headache, it is described as a "zap" sensation . She has phonophobia and photophobia.   Intermittent low back pain : she states went to [facility] yesterday , she states pain started with epidural on 03/26/2005 . She states [facility] will cover the cost of treatment because of it   Patient Active Problem List   Diagnosis Date Noted  . Hypertension associated with type 2 diabetes mellitus ([place]) 12/09/2019  . Diabetes mellitus type 2 in obese ([place]) 12/09/2019  . Hyperlipidemia associated with type 2  diabetes mellitus ([place]) 07/02/2019  . Urge incontinence 08/15/2018  . Altered awareness, transient 06/17/2018  . Headache disorder 06/17/2018  . Family history of premature CAD 09/11/2017  . Thyroid nodule 08/15/2017  . LVH (left ventricular hypertrophy) 02/01/2017  . Elevated serum glutamic pyruvic transaminase (SGPT) level 07/24/2016  . Morbid obesity ([place]) 03/23/2016  . Sinus tachycardia 02/24/2016  . Valvular regurgitation 01/11/2016  . ADHD (attention deficit hyperactivity disorder), combined type 08/30/2015  . Generalized anxiety disorder 12/31/2014  . Major depressive disorder, recurrent episode ([place]) 10/25/2014  . Essential hypertension 10/25/2014    Past Surgical History:  Procedure  Laterality Date  . CESAREAN SECTION    . thyroid nodule biopsy    . TUBAL LIGATION      Family History  Problem Relation Age of Onset  . Heart disease Mother   . Hyperlipidemia Mother   . Hypertension Mother   . Miscarriages / Stillbirths Mother        2  . Anxiety disorder Mother   . Coronary artery disease Mother 84       11 stents  . Alcohol abuse Father   . Cancer Father        testicular  . Depression Father   . Heart disease Sister   . Hyperlipidemia Sister   . Hypertension Sister   . Other Sister         Langerhans Cell Histiocytosis  . Drug abuse Brother   . Alcohol abuse Brother   . Depression Brother   . ADD / ADHD Son   . Mental illness Maternal Aunt   . Alcohol abuse Maternal Grandfather   . Depression Maternal Grandfather   . Anxiety disorder Maternal Grandfather   . Cancer Paternal Grandmother   . Breast cancer Paternal Grandmother        late 70's  . Alcohol abuse Paternal Grandfather     Social History   Tobacco Use  . Smoking status: Former Smoker    Types: Cigarettes, E-cigarettes    Quit date: 06/29/2017    Years since quitting: 2.9  . Smokeless tobacco: Never Used  . Tobacco comment: On and off since 48 yrs of age; now using e-cigarettes  Substance Use Topics  . Alcohol use: Yes    Alcohol/week: 0.0 standard drinks    Comment: rare     Current Outpatient Medications:  .  aspirin EC 81 MG tablet, Take 1 tablet (81 mg total) by mouth daily., Disp: , Rfl:  .  atorvastatin (LIPITOR) 20 MG tablet, Take 1 tablet (20 mg total) by mouth daily., Disp: 90 tablet, Rfl: 1 .  hydrOXYzine (ATARAX/VISTARIL) 50 MG tablet, Take 1 tablet (50 mg total) by mouth at bedtime., Disp: 90 tablet, Rfl: 0 .  lisdexamfetamine (VYVANSE) 50 MG capsule, Take 1 capsule (50 mg total) by mouth daily., Disp: 30 capsule, Rfl: 0 .  lisdexamfetamine (VYVANSE) 50 MG capsule, Take 1 capsule (50 mg total) by mouth daily. Fill Nov 24 th, 2021, Disp: 90 capsule, Rfl: 0 .   metoprolol succinate (TOPROL-XL) 25 MG 24 hr tablet, Take 1 tablet (25 mg total) by mouth daily., Disp: 90 tablet, Rfl: 0 .  olmesartan (BENICAR) 20 MG tablet, Take 0.5-1 tablets (10-20 mg total) by mouth daily., Disp: 90 tablet, Rfl: 0 .  rizatriptan (MAXALT-MLT) 10 MG disintegrating tablet, Take 1 tablet (10 mg total) by mouth as needed for migraine. May repeat in 2 hours if needed, Disp: 10 tablet, Rfl: 0 .  Semaglutide, 1 MG/DOSE, (OZEMPIC, 1  MG/DOSE,) 2 MG/1.5ML SOPN, Inject 1 mg into the skin once a week., Disp: 3 mL, Rfl: 2 .  Vilazodone HCl (VIIBRYD) 20 MG TABS, Take 1 tablet (20 mg total) by mouth daily., Disp: 90 tablet, Rfl: 1  Allergies  Allergen Reactions  . Penicillins Anaphylaxis  . Latex Hives and Rash    I personally reviewed active problem list, medication list, allergies, family history, social history, health maintenance with the patient/caregiver today.   ROS  Constitutional: Negative for fever or weight change.  Respiratory: Negative for cough and shortness of breath.   Cardiovascular: Negative for chest pain or palpitations.  Gastrointestinal: Negative for abdominal pain, no bowel changes.  Musculoskeletal: Negative for gait problem or joint swelling.  Skin: Negative for rash.  Neurological: Negative for dizziness , positive for intermittent headache.  No other specific complaints in a complete review of systems (except as listed in HPI above).   Objective  Vitals:   05/25/20 0748  BP: (!) 142/88  Pulse: 92  Resp: 17  Temp: 97.8 F (36.6 C)  TempSrc: Oral  SpO2: 100%  Weight: 192 lb 8 oz (87.3 kg)  Height: 5\' 3"  (1.6 m)    Body mass index is 34.1 kg/m.  Physical Exam  Constitutional: Patient appears well-developed and well-nourished. Obese  No distress.  HEENT: head atraumatic, normocephalic, pupils equal and reactive to light,  neck supple Cardiovascular: Normal rate, regular rhythm and normal heart sounds.  No murmur heard. No BLE  edema. Pulmonary/Chest: [place] normal and breath sounds normal. No respiratory distress. Abdominal: Soft.  There is no tenderness. Psychiatric: Patient has a normal mood and affect. behavior is normal. Judgment and thought content normal.  Recent Results (from the past 2160 hour(s))  POCT HgB A1C     Status: Abnormal   Collection Time: 03/11/20  9:57 AM  Result Value Ref Range   Hemoglobin A1C 5.8 (A) 4.0 - 5.6 %   HbA1c POC (<> result, manual entry)     HbA1c, POC (prediabetic range)     HbA1c, POC (controlled diabetic range)        PHQ2/9: Depression screen [facility] 2/9 05/25/2020 03/11/2020 12/09/2019 12/09/2019 07/02/2019  Decreased Interest 0 0 0 0 0  Down, Depressed, Hopeless 0 0 0 0 0  PHQ - 2 Score 0 0 0 0 0  Altered sleeping 1 0 0 0 0  Tired, decreased energy 0 0 0 0 0  Change in appetite 0 0 0 0 0  Feeling bad or failure about yourself  0 0 0 0 0  Trouble concentrating 0 0 1 0 0  Moving slowly or fidgety/restless 0 0 0 0 0  Suicidal thoughts 0 0 0 0 0  PHQ-9 Score 1 0 1 0 0  Difficult doing work/chores Not difficult at all - Somewhat difficult - Not difficult at all  Some recent data might be hidden    phq 9 is negative  Fall Risk: Fall Risk  05/25/2020 03/11/2020 12/09/2019 07/02/2019 03/31/2019  Falls in the past year? 0 0 0 0 0  Number falls in past yr: 0 0 0 0 0  Injury with Fall? 0 0 0 0 0  Follow up - - - - -     Functional Status Survey: Is the patient deaf or have difficulty hearing?: No Does the patient have difficulty seeing, even when wearing glasses/contacts?: No Does the patient have difficulty concentrating, remembering, or making decisions?: Yes Does the patient have difficulty walking or climbing stairs?: No Does  the patient have difficulty dressing or bathing?: No Does the patient have difficulty doing errands alone such as visiting a doctor's office or shopping?: No   Assessment & Plan  1. Diabetes mellitus type 2 in obese (HCC)  - POCT HgB  A1C - Semaglutide, 1 MG/DOSE, (OZEMPIC, 1 MG/DOSE,) 2 MG/1.5ML SOPN; Inject 1 mg into the skin once a week.  Dispense: 3 mL; Refill: 2  2. Hyperlipidemia associated with type 2 diabetes mellitus ([place])   3. Need for hepatitis C screening test  - Hepatitis C Antibody  4. Controlled type 2 diabetes mellitus with complication, without long-term current use of insulin (HCC)   5. Leukocytosis, unspecified type  - CBC with Differential  6. Major depression in remission  (HCC)  - Vilazodone HCl (VIIBRYD) 20 MG TABS; Take 1 tablet (20 mg total) by mouth daily.  Dispense: 90 tablet; Refill: 1  7. Essential hypertension  - olmesartan (BENICAR) 20 MG tablet; Take 0.5-1 tablets (10-20 mg total) by mouth daily.  Dispense: 90 tablet; Refill: 0 - metoprolol succinate (TOPROL-XL) 25 MG 24 hr tablet; Take 1 tablet (25 mg total) by mouth daily.  Dispense: 90 tablet; Refill: 0  8. LVH (left ventricular hypertrophy)  - metoprolol succinate (TOPROL-XL) 25 MG 24 hr tablet; Take 1 tablet (25 mg total) by mouth daily.  Dispense: 90 tablet; Refill: 0  9. ADHD (attention deficit hyperactivity disorder), combined type  - lisdexamfetamine (VYVANSE) 50 MG capsule; Take 1 capsule (50 mg total) by mouth daily. Fill Nov 24 th, 2021  Dispense: 90 capsule; Refill: 0  10. Controlled type 2 diabetes mellitus without complication, without long-term current use of insulin (HCC)  - atorvastatin (LIPITOR) 20 MG tablet; Take 1 tablet (20 mg total) by mouth daily.  Dispense: 90 tablet; Refill: 1  11. Mixed hyperlipidemia  - atorvastatin (LIPITOR) 20 MG tablet; Take 1 tablet (20 mg total) by mouth daily.  Dispense: 90 tablet; Refill: 1  12. Hypertension associated with diabetes ([place])   13. Migraine with aura and without status migrainosus, not intractable  - rizatriptan (MAXALT-MLT) 10 MG disintegrating tablet; Take 1 tablet (10 mg total) by mouth as needed for migraine. May repeat in 2 hours if needed  Dispense:  10 tablet; Refill: 0  14. Anxiety  - hydrOXYzine (ATARAX/VISTARIL) 50 MG tablet; Take 1 tablet (50 mg total) by mouth at bedtime.  Dispense: 90 tablet; Refill: 0  15. Intermittent low back pain

## 2020-05-25 ENCOUNTER — Encounter: Payer: Self-pay | Admitting: Family Medicine

## 2020-05-25 ENCOUNTER — Other Ambulatory Visit: Payer: Self-pay

## 2020-05-25 ENCOUNTER — Ambulatory Visit (INDEPENDENT_AMBULATORY_CARE_PROVIDER_SITE_OTHER): Payer: 59 | Admitting: Family Medicine

## 2020-05-25 VITALS — BP 142/88 | HR 92 | Temp 97.8°F | Resp 17 | Ht 63.0 in | Wt 192.5 lb

## 2020-05-25 DIAGNOSIS — G43109 Migraine with aura, not intractable, without status migrainosus: Secondary | ICD-10-CM

## 2020-05-25 DIAGNOSIS — E118 Type 2 diabetes mellitus with unspecified complications: Secondary | ICD-10-CM

## 2020-05-25 DIAGNOSIS — D72829 Elevated white blood cell count, unspecified: Secondary | ICD-10-CM

## 2020-05-25 DIAGNOSIS — I152 Hypertension secondary to endocrine disorders: Secondary | ICD-10-CM

## 2020-05-25 DIAGNOSIS — E1159 Type 2 diabetes mellitus with other circulatory complications: Secondary | ICD-10-CM

## 2020-05-25 DIAGNOSIS — E782 Mixed hyperlipidemia: Secondary | ICD-10-CM

## 2020-05-25 DIAGNOSIS — F325 Major depressive disorder, single episode, in full remission: Secondary | ICD-10-CM

## 2020-05-25 DIAGNOSIS — M545 Low back pain, unspecified: Secondary | ICD-10-CM

## 2020-05-25 DIAGNOSIS — E669 Obesity, unspecified: Secondary | ICD-10-CM | POA: Diagnosis not present

## 2020-05-25 DIAGNOSIS — E785 Hyperlipidemia, unspecified: Secondary | ICD-10-CM

## 2020-05-25 DIAGNOSIS — E1169 Type 2 diabetes mellitus with other specified complication: Secondary | ICD-10-CM | POA: Diagnosis not present

## 2020-05-25 DIAGNOSIS — I517 Cardiomegaly: Secondary | ICD-10-CM

## 2020-05-25 DIAGNOSIS — Z1159 Encounter for screening for other viral diseases: Secondary | ICD-10-CM

## 2020-05-25 DIAGNOSIS — E119 Type 2 diabetes mellitus without complications: Secondary | ICD-10-CM

## 2020-05-25 DIAGNOSIS — F419 Anxiety disorder, unspecified: Secondary | ICD-10-CM

## 2020-05-25 DIAGNOSIS — F902 Attention-deficit hyperactivity disorder, combined type: Secondary | ICD-10-CM

## 2020-05-25 DIAGNOSIS — I10 Essential (primary) hypertension: Secondary | ICD-10-CM

## 2020-05-25 LAB — POCT GLYCOSYLATED HEMOGLOBIN (HGB A1C): Hemoglobin A1C: 6.1 % — AB (ref 4.0–5.6)

## 2020-05-25 MED ORDER — VIIBRYD 20 MG PO TABS: 1.0000 | ORAL_TABLET | Freq: Every day | ORAL | 1 refills | Status: DC

## 2020-05-25 MED ORDER — OZEMPIC (1 MG/DOSE) 2 MG/1.5ML ~~LOC~~ SOPN: 1.0000 mg | PEN_INJECTOR | SUBCUTANEOUS | 2 refills | Status: DC

## 2020-05-25 MED ORDER — RIZATRIPTAN BENZOATE 10 MG PO TBDP: 10.0000 mg | ORAL_TABLET | ORAL | 0 refills | Status: DC | PRN

## 2020-05-25 MED ORDER — LISDEXAMFETAMINE DIMESYLATE 50 MG PO CAPS: 50.0000 mg | ORAL_CAPSULE | Freq: Every day | ORAL | 0 refills | Status: DC

## 2020-05-25 MED ORDER — ATORVASTATIN CALCIUM 20 MG PO TABS: 20.0000 mg | ORAL_TABLET | Freq: Every day | ORAL | 1 refills | Status: DC

## 2020-05-25 MED ORDER — OLMESARTAN MEDOXOMIL 20 MG PO TABS: 10.0000 mg | ORAL_TABLET | Freq: Every day | ORAL | 0 refills | Status: DC

## 2020-05-25 MED ORDER — METOPROLOL SUCCINATE ER 25 MG PO TB24: 25.0000 mg | ORAL_TABLET | Freq: Every day | ORAL | 0 refills | Status: DC

## 2020-05-25 MED ORDER — HYDROXYZINE HCL 50 MG PO TABS: 50.0000 mg | ORAL_TABLET | Freq: Every evening | ORAL | 0 refills | Status: DC

## 2020-05-26 LAB — HEPATITIS C ANTIBODY
Hepatitis C Ab: NONREACTIVE
SIGNAL TO CUT-OFF: 0.01 (ref <1.00)

## 2020-05-26 LAB — CBC WITH DIFFERENTIAL/PLATELET
Absolute Monocytes: 495 cells/uL (ref 200–950)
Basophils Absolute: 23 cells/uL (ref 0–200)
Basophils Relative: 0.3 %
Eosinophils Absolute: 113 cells/uL (ref 15–500)
Eosinophils Relative: 1.5 %
HCT: 42.5 % (ref 35.0–45.0)
Hemoglobin: 14.5 g/dL (ref 11.7–15.5)
Lymphs Abs: 2880 cells/uL (ref 850–3900)
MCH: 29.4 pg (ref 27.0–33.0)
MCHC: 34.1 g/dL (ref 32.0–36.0)
MCV: 86 fL (ref 80.0–100.0)
MPV: 10.9 fL (ref 7.5–12.5)
Monocytes Relative: 6.6 %
Neutro Abs: 3990 cells/uL (ref 1500–7800)
Neutrophils Relative %: 53.2 %
Platelets: 331 10*3/uL (ref 140–400)
RBC: 4.94 10*6/uL (ref 3.80–5.10)
RDW: 12.9 % (ref 11.0–15.0)
Total Lymphocyte: 38.4 %
WBC: 7.5 10*3/uL (ref 3.8–10.8)

## 2020-06-21 ENCOUNTER — Encounter: Payer: Self-pay | Admitting: Family Medicine

## 2020-07-10 ENCOUNTER — Encounter: Payer: Self-pay | Admitting: Family Medicine

## 2020-07-11 ENCOUNTER — Other Ambulatory Visit: Payer: Self-pay

## 2020-07-11 ENCOUNTER — Other Ambulatory Visit: Payer: Self-pay | Admitting: Family Medicine

## 2020-07-11 DIAGNOSIS — F325 Major depressive disorder, single episode, in full remission: Secondary | ICD-10-CM

## 2020-07-11 MED ORDER — VIIBRYD 20 MG PO TABS: 1.0000 | ORAL_TABLET | Freq: Every day | ORAL | 0 refills | Status: DC

## 2020-07-26 ENCOUNTER — Other Ambulatory Visit: Payer: Self-pay

## 2020-07-26 DIAGNOSIS — E669 Obesity, unspecified: Secondary | ICD-10-CM

## 2020-07-26 DIAGNOSIS — E1169 Type 2 diabetes mellitus with other specified complication: Secondary | ICD-10-CM

## 2020-07-26 MED ORDER — OZEMPIC (1 MG/DOSE) 2 MG/1.5ML ~~LOC~~ SOPN: 1.0000 mg | PEN_INJECTOR | SUBCUTANEOUS | 0 refills | Status: DC

## 2020-08-02 ENCOUNTER — Other Ambulatory Visit: Payer: Self-pay

## 2020-08-02 DIAGNOSIS — E119 Type 2 diabetes mellitus without complications: Secondary | ICD-10-CM

## 2020-08-02 DIAGNOSIS — E782 Mixed hyperlipidemia: Secondary | ICD-10-CM

## 2020-08-12 ENCOUNTER — Other Ambulatory Visit: Payer: Self-pay | Admitting: Family Medicine

## 2020-08-12 DIAGNOSIS — I517 Cardiomegaly: Secondary | ICD-10-CM

## 2020-08-12 DIAGNOSIS — I10 Essential (primary) hypertension: Secondary | ICD-10-CM

## 2020-08-23 ENCOUNTER — Other Ambulatory Visit: Payer: Self-pay | Admitting: Family Medicine

## 2020-08-23 DIAGNOSIS — I10 Essential (primary) hypertension: Secondary | ICD-10-CM

## 2020-08-25 NOTE — Progress Notes (Signed)
Name: Melanie Villarreal   MRN: [id number]    DOB: January 17, 1976   Date:08/26/2020       Progress Note  Subjective  Chief Complaint  Follow Up  HPI  DMII: she has associated hypertension, dyslipidemia, obese, she also has neuropahty. She has been on Ozempic since May 2020 and glucose has been controlled , she denies any recent episodes of hypoglycemia  She denies polyphagia, polydipsia or polyuria . A1C today is  5.8  % We will decrease dose of Ozempic from 1 mg to 0.5 mg and monitor She is on Atorvastatin for dyslipidemia   HTN: bp is at goal today, she has been   taking medications as prescribed, no side effects. Denies chest pain , palpitation or SOB.    Morbid Obesity: she has been obese since age 24. She states worse since the last pregnancy. She was on a  glutten free diet, chooses low carb choices,  walking 2 miles three days a week, snacking on protein smoothies at night. Started diet was May 1st  2020 , her weight was 235 lbs and she was  down to 186 lbs , today weight is at 189 lbs. Continue life style modifications    ADHD: she states symptoms started in childhood, but was treated at age 45 , she has tried Adderal - caused anger outburst, felt tired at the end of the day, Strattera caused sedation, Ritalin caused palpitation. She has been on Vyvanse for the past four years and is working well for her . No side effects of medication, except is helps a little on her appetite She states her boss notices when she does not take the medication. She states her husband and children also noticed right away when she skips the dose. 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 Her parents were going through a divorce, mother left and stayed with her step-father, she has one brother that died as an infant and another one that died from drug overdose at age  4 ( she was  6 ). There is a family history of depression. She was on Zoloft from age 45 until year 2019 when she was switched from Zoloft to [place] because she had a relapse. ( she also states while on zoloft tried some other SSRI - not sure of the name). She has been doing well on Vibrid. 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 She stopped seeing psychiatrist because of turn over of providers. She seemed very hyper today, speaking fast, states has a meeting and was in a hurry  Migraine headaches: she states seldom has episodes, she states she has a tingling sensation on her scalp followed by a headache, it is described as a "zap" sensation . She has phonophobia and photophobia. She does not like taking Maxalt because of side effects, we will try ubrelvy Episodes about once a month, under more stress at work, audit coming in soon   Patient Active Problem List   Diagnosis Date Noted  . Hypertension associated with type 2 diabetes mellitus ([place]) 12/09/2019  . Diabetes mellitus type 2 in obese ([place]) 12/09/2019  . Hyperlipidemia associated with type 2 diabetes mellitus ([place]) 07/02/2019  . Urge incontinence 08/15/2018  . Altered awareness, transient 06/17/2018  .  Headache disorder 06/17/2018  . Family history of premature CAD 09/11/2017  . Thyroid nodule 08/15/2017  . LVH (left ventricular hypertrophy) 02/01/2017  . Elevated serum glutamic pyruvic transaminase (SGPT) level 07/24/2016  . Morbid obesity ([place]) 03/23/2016  . Sinus tachycardia 02/24/2016  . Valvular regurgitation 01/11/2016  . ADHD (attention deficit hyperactivity disorder), combined type 08/30/2015  . Generalized anxiety disorder 12/31/2014  . Major depressive disorder, recurrent episode ([place]) 10/25/2014  . Essential hypertension 10/25/2014    Past Surgical History:  Procedure Laterality Date  . CESAREAN SECTION    . thyroid nodule biopsy    . TUBAL LIGATION       Family History  Problem Relation Age of Onset  . Heart disease Mother   . Hyperlipidemia Mother   . Hypertension Mother   . Miscarriages / Stillbirths Mother        2  . Anxiety disorder Mother   . Coronary artery disease Mother 81       11 stents  . Alcohol abuse Father   . Cancer Father        testicular  . Depression Father   . Heart disease Sister   . Hyperlipidemia Sister   . Hypertension Sister   . Other Sister         Langerhans Cell Histiocytosis  . Drug abuse Brother   . Alcohol abuse Brother   . Depression Brother   . ADD / ADHD Son   . Mental illness Maternal Aunt   . Alcohol abuse Maternal Grandfather   . Depression Maternal Grandfather   . Anxiety disorder Maternal Grandfather   . Cancer Paternal Grandmother   . Breast cancer Paternal Grandmother        late 31's  . Alcohol abuse Paternal Grandfather     Social History   Tobacco Use  . Smoking status: Former Smoker    Types: Cigarettes, E-cigarettes    Quit date: 06/29/2017    Years since quitting: 3.1  . Smokeless tobacco: Never Used  . Tobacco comment: On and off since 76 yrs of age; now using e-cigarettes  Substance Use Topics  . Alcohol use: Yes    Alcohol/week: 0.0 standard drinks    Comment: rare     Current Outpatient Medications:  .  Semaglutide,0.25 or 0.5MG /DOS, (OZEMPIC, 0.25 OR 0.5 MG/DOSE,) 2 MG/1.5ML SOPN, Inject 0.5 mg into the skin once a week., Disp: 4.5 mL, Rfl: 1 .  Ubrogepant (UBRELVY) 100 MG TABS, Take 1 tablet by mouth daily as needed., Disp: 9 tablet, Rfl: 1 .  aspirin EC 81 MG tablet, Take 1 tablet (81 mg total) by mouth daily., Disp: , Rfl:  .  atorvastatin (LIPITOR) 20 MG tablet, Take 1 tablet (20 mg total) by mouth daily., Disp: 90 tablet, Rfl: 1 .  hydrOXYzine (ATARAX/VISTARIL) 50 MG tablet, Take 1 tablet (50 mg total) by mouth at bedtime., Disp: 90 tablet, Rfl: 1 .  lisdexamfetamine (VYVANSE) 50 MG capsule, Take 1 capsule (50 mg total) by mouth daily. Fill March  1st, 2022, Disp: 90 capsule, Rfl: 0 .  metoprolol succinate (TOPROL-XL) 25 MG 24 hr tablet, TAKE 1 TABLET(25 MG) BY MOUTH DAILY, Disp: 90 tablet, Rfl: 1 .  olmesartan (BENICAR) 20 MG tablet, Take 0.5-1 tablets (10-20 mg total) by mouth daily., Disp: 90 tablet, Rfl: 1 .  rizatriptan (MAXALT-MLT) 10 MG disintegrating tablet, Take 1 tablet (10 mg total) by mouth as needed for migraine. May repeat in 2 hours if needed, Disp: 10 tablet, Rfl: 0 .  Vilazodone HCl (VIIBRYD) 20 MG TABS, Take 1 tablet (20 mg total) by mouth daily., Disp: 90 tablet, Rfl: 1  Allergies  Allergen Reactions  . Penicillins Anaphylaxis  . Latex Hives and Rash    I personally reviewed active problem list, medication list, allergies, family history, social history, health maintenance with the patient/caregiver today.   ROS  Constitutional: Negative for fever or weight change.  Respiratory: Negative for cough and shortness of breath.   Cardiovascular: Negative for chest pain or palpitations.  Gastrointestinal: Negative for abdominal pain, no bowel changes.  Musculoskeletal: Negative for gait problem or joint swelling.  Skin: Negative for rash.  Neurological: Negative for dizziness, positive for intermittent  headache.  No other specific complaints in a complete review of systems (except as listed in HPI above).  Objective  Vitals:   08/26/20 0926  BP: 128/76  Pulse: 85  Resp: 16  Temp: 98.1 F (36.7 C)  TempSrc: Oral  SpO2: 98%  Weight: 189 lb 3.2 oz (85.8 kg)  Height: 5\' 3"  (1.6 m)    Body mass index is 33.52 kg/m.  Physical Exam  Constitutional: Patient appears well-developed and well-nourished. ObeseNo distress.  HEENT: head atraumatic, normocephalic, pupils equal and reactive to light,  neck supple Cardiovascular: Normal rate, regular rhythm and normal heart sounds.  No murmur heard. No BLE edema. Pulmonary/Chest: Effort normal and breath sounds normal. No respiratory distress. Abdominal: Soft.   There is no tenderness. Psychiatric: Patient has a normal mood and affect. behavior is normal. Judgment and thought content normal.  Recent Results (from the past 2160 hour(s))  POCT HgB A1C     Status: Abnormal   Collection Time: 08/26/20  9:43 AM  Result Value Ref Range   Hemoglobin A1C 5.8 (A) 4.0 - 5.6 %   HbA1c POC (<> result, manual entry)     HbA1c, POC (prediabetic range)     HbA1c, POC (controlled diabetic range)        PHQ2/9: Depression screen [facility] 2/9 08/26/2020 05/25/2020 03/11/2020 12/09/2019 12/09/2019  Decreased Interest 0 0 0 0 0  Down, Depressed, Hopeless 0 0 0 0 0  PHQ - 2 Score 0 0 0 0 0  Altered sleeping 1 1 0 0 0  Tired, decreased energy 0 0 0 0 0  Change in appetite 0 0 0 0 0  Feeling bad or failure about yourself  0 0 0 0 0  Trouble concentrating 0 0 0 1 0  Moving slowly or fidgety/restless 0 0 0 0 0  Suicidal thoughts 0 0 0 0 0  PHQ-9 Score 1 1 0 1 0  Difficult doing work/chores - Not difficult at all - Somewhat difficult -  Some recent data might be hidden    phq 9 is negative   Fall Risk: Fall Risk  08/26/2020 05/25/2020 03/11/2020 12/09/2019 07/02/2019  Falls in the past year? 0 0 0 0 0  Number falls in past yr: 0 0 0 0 0  Injury with Fall? 0 0 0 0 0  Follow up - - - - -     Functional Status Survey: Is the patient deaf or have difficulty hearing?: No Does the patient have difficulty seeing, even when wearing glasses/contacts?: No Does the patient have difficulty concentrating, remembering, or making decisions?: No Does the patient have difficulty walking or climbing stairs?: No Does the patient have difficulty dressing or bathing?: No Does the patient have difficulty doing errands alone such as visiting a doctor's office or shopping?: No  Assessment & Plan  1. Diabetes mellitus type 2 in obese (HCC)  - POCT HgB A1C - Semaglutide,0.25 or 0.5MG /DOS, (OZEMPIC, 0.25 OR 0.5 MG/DOSE,) 2 MG/1.5ML SOPN; Inject 0.5 mg into the skin once a week.   Dispense: 4.5 mL; Refill: 1  2. ADHD (attention deficit hyperactivity disorder), combined type  - lisdexamfetamine (VYVANSE) 50 MG capsule; Take 1 capsule (50 mg total) by mouth daily. Fill March 1st, 2022  Dispense: 90 capsule; Refill: 0  3. Essential hypertension  - metoprolol succinate (TOPROL-XL) 25 MG 24 hr tablet; TAKE 1 TABLET(25 MG) BY MOUTH DAILY  Dispense: 90 tablet; Refill: 1 - olmesartan (BENICAR) 20 MG tablet; Take 0.5-1 tablets (10-20 mg total) by mouth daily.  Dispense: 90 tablet; Refill: 1  4. LVH (left ventricular hypertrophy)  - metoprolol succinate (TOPROL-XL) 25 MG 24 hr tablet; TAKE 1 TABLET(25 MG) BY MOUTH DAILY  Dispense: 90 tablet; Refill: 1  5. Major depression in remission (HCC)  - Vilazodone HCl (VIIBRYD) 20 MG TABS; Take 1 tablet (20 mg total) by mouth daily.  Dispense: 90 tablet; Refill: 1  6. Migraine with aura and without status migrainosus, not intractable  - rizatriptan (MAXALT-MLT) 10 MG disintegrating tablet; Take 1 tablet (10 mg total) by mouth as needed for migraine. May repeat in 2 hours if needed  Dispense: 10 tablet; Refill: 0 - Ubrogepant (UBRELVY) 100 MG TABS; Take 1 tablet by mouth daily as needed.  Dispense: 9 tablet; Refill: 1  7. Anxiety  - hydrOXYzine (ATARAX/VISTARIL) 50 MG tablet; Take 1 tablet (50 mg total) by mouth at bedtime.  Dispense: 90 tablet; Refill: 1

## 2020-08-26 ENCOUNTER — Encounter: Payer: Self-pay | Admitting: Family Medicine

## 2020-08-26 ENCOUNTER — Ambulatory Visit (INDEPENDENT_AMBULATORY_CARE_PROVIDER_SITE_OTHER): Payer: 59 | Admitting: Family Medicine

## 2020-08-26 ENCOUNTER — Other Ambulatory Visit: Payer: Self-pay

## 2020-08-26 VITALS — BP 128/76 | HR 85 | Temp 98.1°F | Resp 16 | Ht 63.0 in | Wt 189.2 lb

## 2020-08-26 DIAGNOSIS — F902 Attention-deficit hyperactivity disorder, combined type: Secondary | ICD-10-CM

## 2020-08-26 DIAGNOSIS — G43109 Migraine with aura, not intractable, without status migrainosus: Secondary | ICD-10-CM

## 2020-08-26 DIAGNOSIS — I517 Cardiomegaly: Secondary | ICD-10-CM

## 2020-08-26 DIAGNOSIS — E1169 Type 2 diabetes mellitus with other specified complication: Secondary | ICD-10-CM | POA: Diagnosis not present

## 2020-08-26 DIAGNOSIS — E669 Obesity, unspecified: Secondary | ICD-10-CM

## 2020-08-26 DIAGNOSIS — I10 Essential (primary) hypertension: Secondary | ICD-10-CM

## 2020-08-26 DIAGNOSIS — F419 Anxiety disorder, unspecified: Secondary | ICD-10-CM

## 2020-08-26 DIAGNOSIS — F325 Major depressive disorder, single episode, in full remission: Secondary | ICD-10-CM

## 2020-08-26 LAB — POCT GLYCOSYLATED HEMOGLOBIN (HGB A1C): Hemoglobin A1C: 5.8 % — AB (ref 4.0–5.6)

## 2020-08-26 MED ORDER — UBRELVY 100 MG PO TABS: 1.0000 | ORAL_TABLET | Freq: Every day | ORAL | 1 refills | Status: AC | PRN

## 2020-08-26 MED ORDER — VIIBRYD 20 MG PO TABS: 1.0000 | ORAL_TABLET | Freq: Every day | ORAL | 1 refills | Status: DC

## 2020-08-26 MED ORDER — OZEMPIC (0.25 OR 0.5 MG/DOSE) 2 MG/1.5ML ~~LOC~~ SOPN: 0.5000 mg | PEN_INJECTOR | SUBCUTANEOUS | 1 refills | Status: DC

## 2020-08-26 MED ORDER — LISDEXAMFETAMINE DIMESYLATE 50 MG PO CAPS
50.0000 mg | ORAL_CAPSULE | Freq: Every day | ORAL | 0 refills | Status: DC
Start: 2020-08-26 — End: 2020-12-19

## 2020-08-26 MED ORDER — OLMESARTAN MEDOXOMIL 20 MG PO TABS: 10.0000 mg | ORAL_TABLET | Freq: Every day | ORAL | 1 refills | Status: DC

## 2020-08-26 MED ORDER — METOPROLOL SUCCINATE ER 25 MG PO TB24: ORAL_TABLET | ORAL | 1 refills | Status: DC

## 2020-08-26 MED ORDER — HYDROXYZINE HCL 50 MG PO TABS: 50.0000 mg | ORAL_TABLET | Freq: Every evening | ORAL | 1 refills | Status: DC

## 2020-08-26 MED ORDER — RIZATRIPTAN BENZOATE 10 MG PO TBDP: 10.0000 mg | ORAL_TABLET | ORAL | 0 refills | Status: DC | PRN

## 2020-09-01 ENCOUNTER — Encounter: Payer: Self-pay | Admitting: Family Medicine

## 2020-09-12 ENCOUNTER — Other Ambulatory Visit: Payer: Self-pay | Admitting: Family Medicine

## 2020-10-09 ENCOUNTER — Encounter: Payer: Self-pay | Admitting: Family Medicine

## 2020-10-31 IMAGING — US US BREAST*R* LIMITED INC AXILLA
1 series · 6 of 6 positions shown · non-contrast
Comparison: None.

CLINICAL DATA: Patient presents for a bilateral diagnostic
examination due to 2 adjacent palpable abnormalities over the lower
central left breast.

EXAM:
DIGITAL DIAGNOSTIC bilateral MAMMOGRAM WITH CAD AND TOMO
ULTRASOUND bilateral BREAST

[Series 1: us breast*right* limited inc axilla · 0.06mm/px · 6 of 6 slices shown]
[im 1/6]
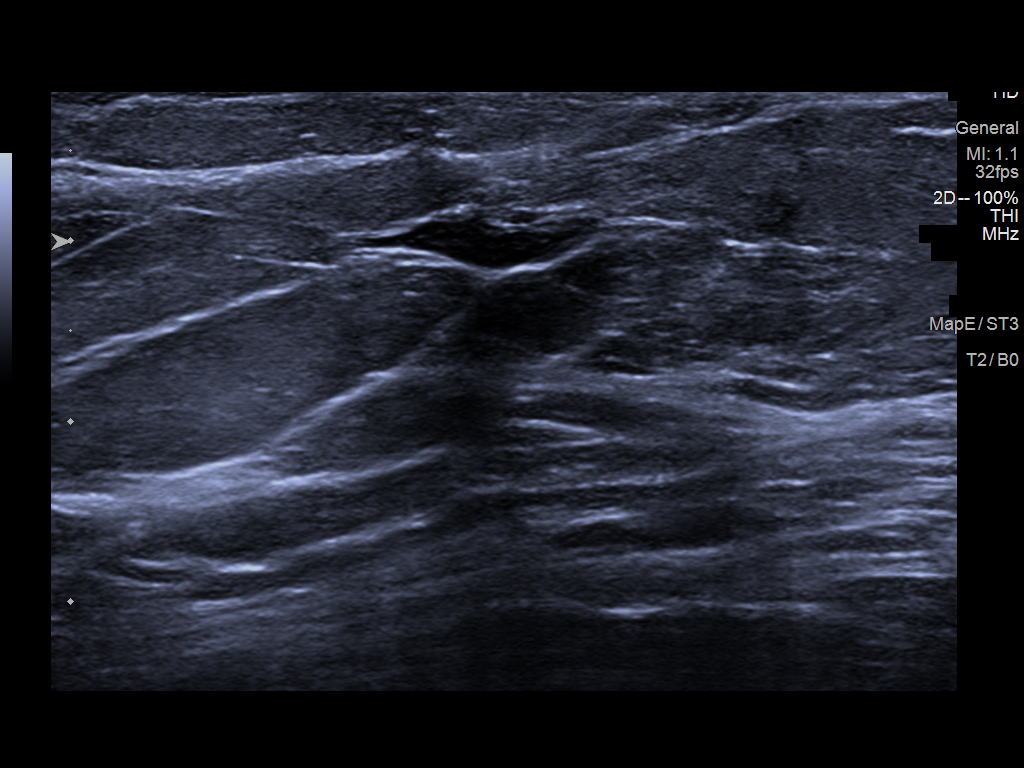
[im 2/6]
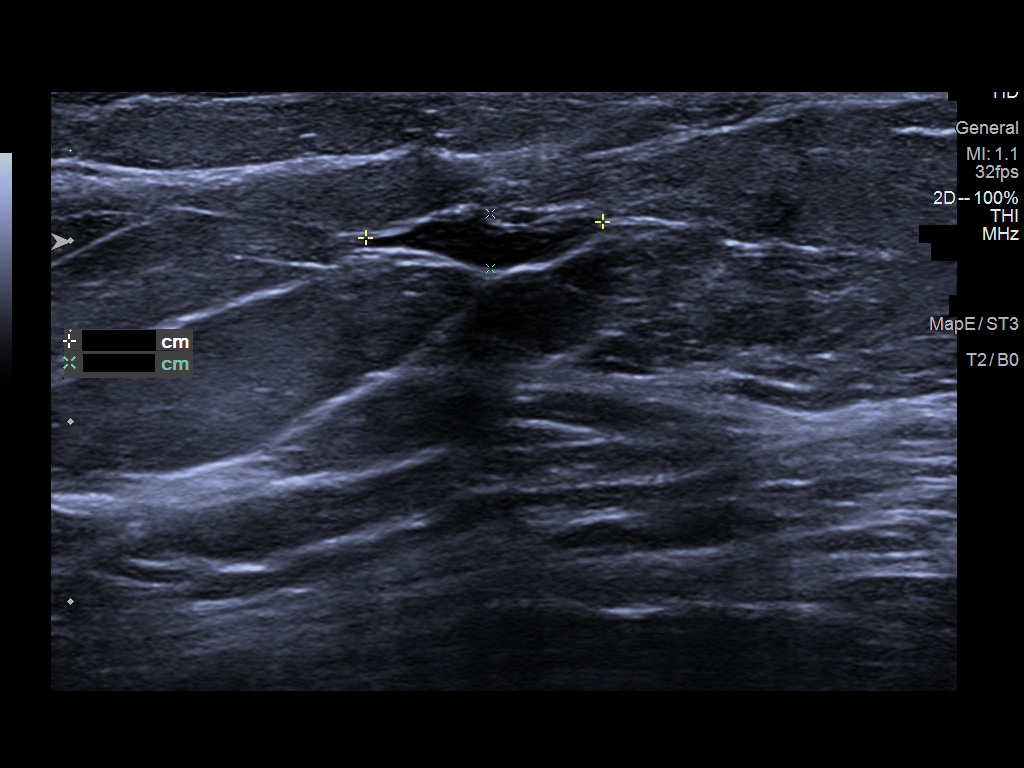
[im 3/6]
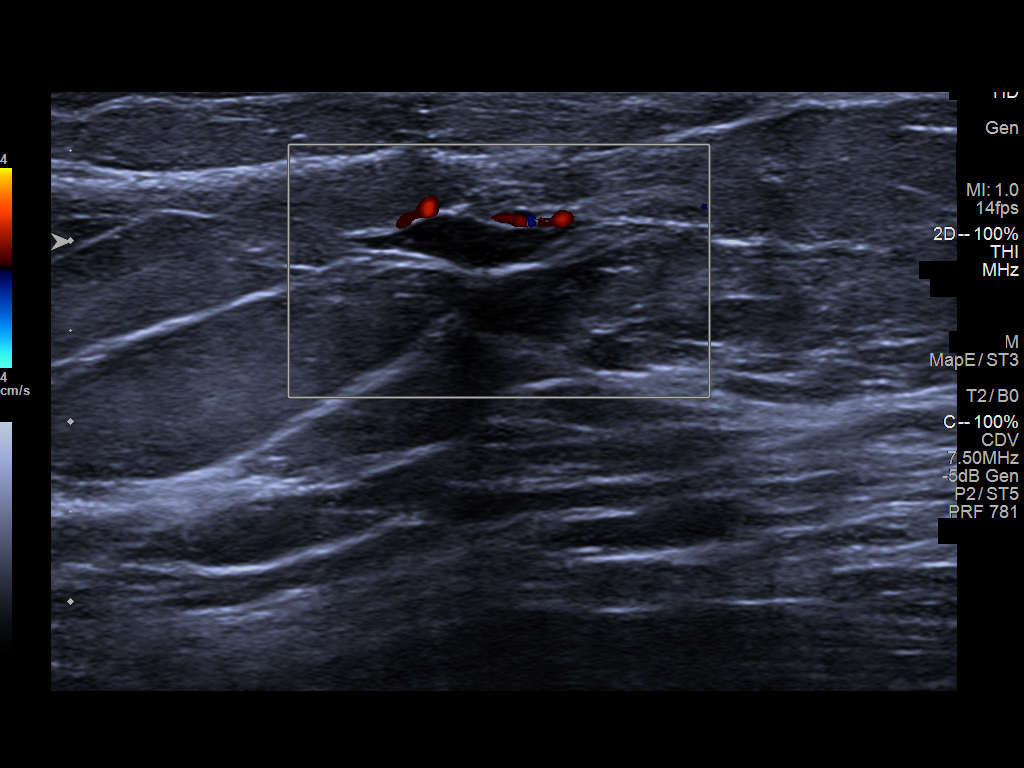
[im 4/6]
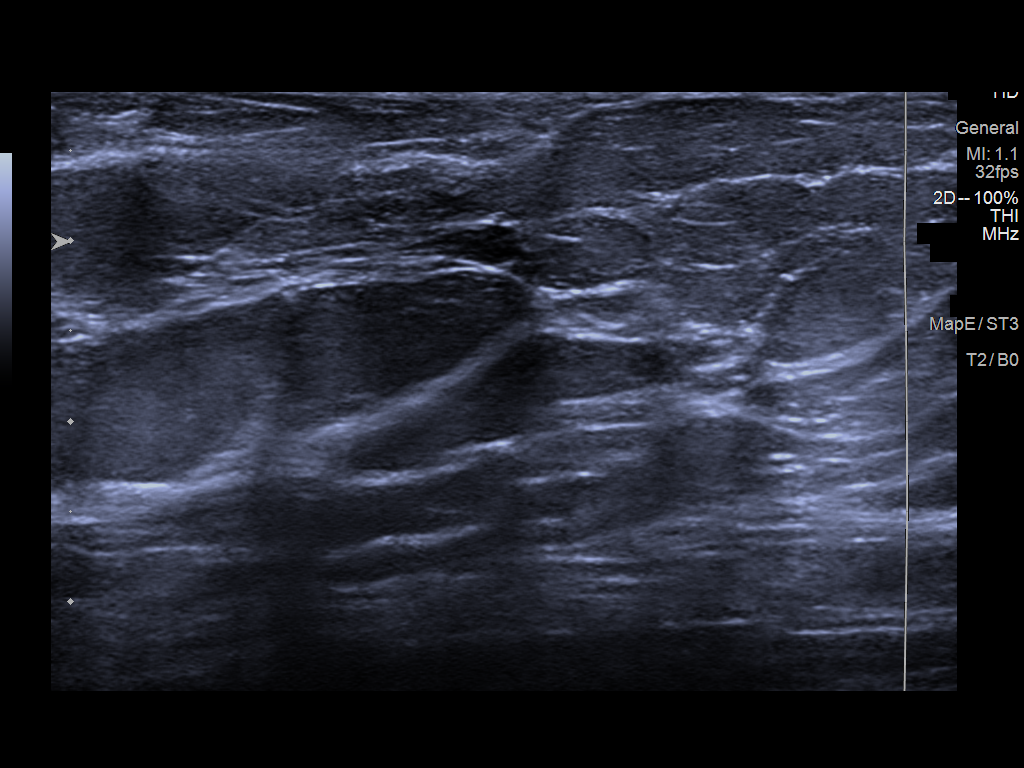
[im 5/6]
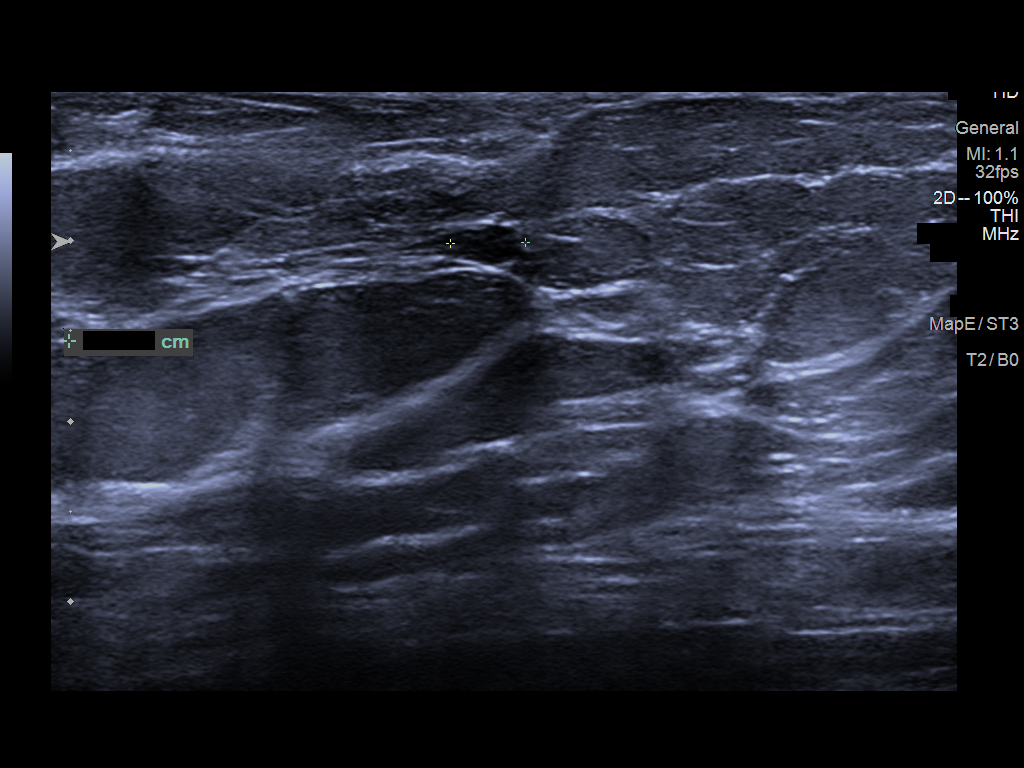
[im 6/6]
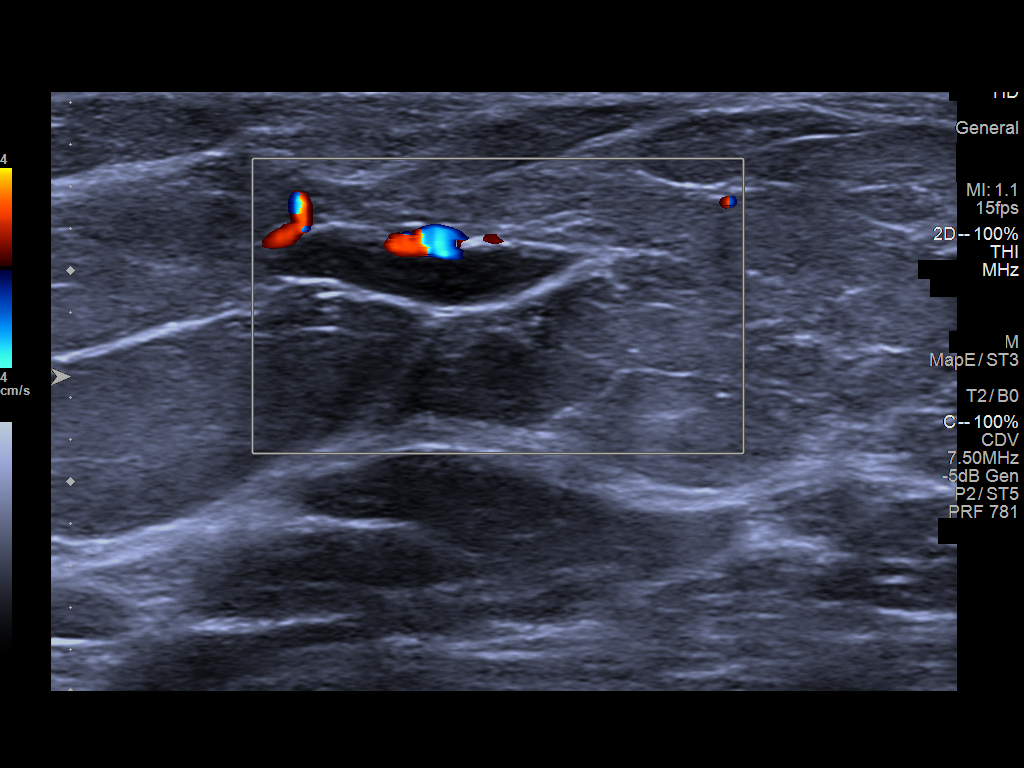

[6 of 6 positions shown; findings below may reference images not displayed]

ACR Breast Density Category b: There are scattered areas of
fibroglandular density.
FINDINGS: Examination demonstrates no focal abnormality over the lower central
left breast to account for patient's 2 adjacent palpable
abnormalities. Remainder of the left breast is unremarkable. There
is a oval circumscribed 1.1 cm mass over the middle third of the
outer mid to lower right breast. Remainder of the right breast is
unremarkable.

Mammographic images were processed with CAD.

On physical exam, I palpate no focal abnormality over the lower
central left breast.

Targeted ultrasound is performed, showing no focal abnormality over
the lower central left breast to account for patient's 2 adjacent
palpable abnormalities.

Ultrasound over the 9 o'clock position of the right breast 6 cm from
the nipple demonstrates an oval circumscribed cystic structure with
a few internal mobile echoes. This measures 0.3 x 0.4 x 1.3 cm. It
is difficult to distinguish whether this represents a minimally
complicated cyst versus focally dilated duct. There is no internal
vascularity and no focal intracystic solid component.
IMPRESSION: 1. No focal abnormality over the lower central left breast to
account for patient's 2 adjacent palpable abnormalities.

2. Probable benign minimally complicated 1.3 cm cyst versus less
likely focally dilated duct over the 9 o'clock position of the right
breast 6 cm from the nipple.

RECOMMENDATION:
1. Recommend six-month follow-up diagnostic right breast mammogram
and ultrasound to document stability of this probable benign
finding.

2. Recommend continued management of patient's 2 palpable left
breast masses on a clinical basis.

I have discussed the findings and recommendations with the patient.
If applicable, a reminder letter will be sent to the patient
regarding the next appointment.

BI-RADS CATEGORY  3: Probably benign.

## 2020-11-02 ENCOUNTER — Other Ambulatory Visit: Payer: Self-pay | Admitting: Family Medicine

## 2020-11-02 DIAGNOSIS — I10 Essential (primary) hypertension: Secondary | ICD-10-CM

## 2020-11-02 DIAGNOSIS — I517 Cardiomegaly: Secondary | ICD-10-CM

## 2020-11-02 NOTE — Telephone Encounter (Signed)
Requested Prescriptions  Pending Prescriptions Disp Refills  . metoprolol succinate (TOPROL-XL) 25 MG 24 hr tablet [Pharmacy Med Name: METOPROLOL SUCC ER 25 MG TAB[*]] 90 tablet 0    Sig: TAKE ONE TABLET BY MOUTH ONE TIME DAILY     Cardiovascular:  Beta Blockers Passed - 11/02/2020  2:02 AM      Passed - Last BP in normal range    BP Readings from Last 1 Encounters:  08/26/20 128/76         Passed - Last Heart Rate in normal range    Pulse Readings from Last 1 Encounters:  08/26/20 85         Passed - Valid encounter within last 6 months    Recent Outpatient Visits          2 months ago Diabetes mellitus type 2 in obese [facility])   [facility] [place], Drue Stager, MD   5 months ago Diabetes mellitus type 2 in obese [facility])   [facility] [place], Drue Stager, MD   7 months ago Diabetes mellitus type 2 in obese [facility])   [facility] Steele Sizer, MD   10 months ago Hypertension associated with diabetes [facility])   [facility] [place], Drue Stager, MD   1 year ago Controlled type 2 diabetes mellitus without complication, without long-term current use of insulin [facility])   [place], [place]      Future Appointments            In 2 months Steele Sizer, MD [facility], [facility]

## 2020-11-03 ENCOUNTER — Other Ambulatory Visit: Payer: Self-pay | Admitting: Family Medicine

## 2020-11-03 DIAGNOSIS — R928 Other abnormal and inconclusive findings on diagnostic imaging of breast: Secondary | ICD-10-CM

## 2020-11-04 LAB — HM DIABETES EYE EXAM

## 2020-12-02 ENCOUNTER — Other Ambulatory Visit: Payer: Self-pay | Admitting: Family Medicine

## 2020-12-02 DIAGNOSIS — E1169 Type 2 diabetes mellitus with other specified complication: Secondary | ICD-10-CM

## 2020-12-02 NOTE — Telephone Encounter (Signed)
Requested Prescriptions  Pending Prescriptions Disp Refills  . OZEMPIC, 0.25 OR 0.5 MG/DOSE, 2 MG/1.5ML SOPN [Pharmacy Med Name: OZEMPIC 0.25-0.5 MG DOSE PEN[R]] 4.5 mL 1    Sig: INJECT 0.5MG  UNDER THE SKIN EVERY WEEK     Endocrinology:  Diabetes - GLP-1 Receptor Agonists Passed - 12/02/2020  1:55 AM      Passed - HBA1C is between 0 and 7.9 and within 180 days    Hemoglobin A1C  Date Value Ref Range Status  08/26/2020 5.8 (A) 4.0 - 5.6 % Final   HbA1c, POC (controlled diabetic range)  Date Value Ref Range Status  08/15/2018 6.6 0.0 - 7.0 % Final   Hgb A1c MFr Bld  Date Value Ref Range Status  03/31/2019 6.8 (H) <5.7 % of total Hgb Final    Comment:    For someone without known diabetes, a hemoglobin A1c value of 6.5% or greater indicates that they may have  diabetes and this should be confirmed with a follow-up  test. . For someone with known diabetes, a value <7% indicates  that their diabetes is well controlled and a value  greater than or equal to 7% indicates suboptimal  control. A1c targets should be individualized based on  duration of diabetes, age, comorbid conditions, and  other considerations. . Currently, no consensus exists regarding use of hemoglobin A1c for diagnosis of diabetes for children. Melanie Villarreal - Valid encounter within last 6 months    Recent Outpatient Visits          3 months ago Diabetes mellitus type 2 in obese [facility])   [facility] [place], Melanie Stager, Melanie Villarreal   6 months ago Diabetes mellitus type 2 in obese [facility])   [facility] [place], Melanie Stager, Melanie Villarreal   8 months ago Diabetes mellitus type 2 in obese [facility])   [facility] Melanie Sizer, Melanie Villarreal   11 months ago Hypertension associated with diabetes [facility])   [facility] Melanie Sizer, Melanie Villarreal   1 year ago Controlled type 2 diabetes mellitus without complication, without long-term current use of insulin [facility])   [place], [place], Melanie Villarreal      Future Appointments            In 1 month Melanie Sizer, Melanie Villarreal [facility], [facility]

## 2020-12-15 ENCOUNTER — Encounter: Payer: Self-pay | Admitting: Family Medicine

## 2020-12-17 ENCOUNTER — Other Ambulatory Visit: Payer: Self-pay | Admitting: Family Medicine

## 2020-12-17 DIAGNOSIS — F902 Attention-deficit hyperactivity disorder, combined type: Secondary | ICD-10-CM

## 2020-12-17 NOTE — Telephone Encounter (Signed)
Requested medication (s) are due for refill today: yes  Requested medication (s) are on the active medication list: yes  Last refill:  08/26/20 #90  Future visit scheduled: yes  Notes to clinic:  med not delegated to NT to RF   Requested Prescriptions  Pending Prescriptions Disp Refills   VYVANSE 50 MG capsule [Pharmacy Med Name: VYVANSE 50 MG CAP[$]] 90 capsule 0    Sig: TAKE ONE CAPSULE BY MOUTH ONE TIME DAILY      Not Delegated - Psychiatry:  Stimulants/ADHD Failed - 12/17/2020  4:41 PM      Failed - This refill cannot be delegated      Failed - Urine Drug Screen completed in last 360 days      Failed - Valid encounter within last 3 months    Recent Outpatient Visits           3 months ago Diabetes mellitus type 2 in obese [facility])   [facility] Steele Sizer, MD   6 months ago Diabetes mellitus type 2 in obese [facility])   [facility] [place], Drue Stager, MD   9 months ago Diabetes mellitus type 2 in obese [facility])   [facility] Steele Sizer, MD   1 year ago Hypertension associated with diabetes [facility])   [facility] [place], Drue Stager, MD   1 year ago Controlled type 2 diabetes mellitus without complication, without long-term current use of insulin [facility])   [place], [place]       Future Appointments             In 2 weeks Steele Sizer, MD [facility], [facility]

## 2020-12-19 ENCOUNTER — Other Ambulatory Visit: Payer: Self-pay | Admitting: Family Medicine

## 2020-12-19 DIAGNOSIS — F902 Attention-deficit hyperactivity disorder, combined type: Secondary | ICD-10-CM

## 2020-12-19 MED ORDER — LISDEXAMFETAMINE DIMESYLATE 50 MG PO CAPS: 50.0000 mg | ORAL_CAPSULE | Freq: Every day | ORAL | 0 refills | Status: DC

## 2020-12-19 NOTE — Telephone Encounter (Signed)
Pt has an appt on 01/02/21

## 2020-12-30 NOTE — Progress Notes (Signed)
Name: Melanie Villarreal   MRN: [id number]    DOB: 03/23/1976   Date:01/02/2021       Progress Note  Subjective  Chief Complaint  Follow up   HPI  DMII: she has associated hypertension, dyslipidemia, obese, she also has neuropahty. She has been on Ozempic since May 2020 and glucose has been controlled , she has occasional hypoglycemia when she skips meals. She denies polyphagia, polydipsia or polyuria . A1C today is down to 5.5 % . She is down from 1 mg to 0.5 mg and monitor She is on Atorvastatin for dyslipidemia , but currently only taking half dose of Atorvastatin. Weight is stable.    HTN: bp is at goal today, she has been half dose of Benicar ( down from 20 mg to 10 mg ) and off Metoprolol for 3 months and bp is at goal.  Denies chest pain , palpitation or SOB.     Morbid Obesity: she has been obese since age 22. She states worse since the last pregnancy. She was on a  glutten free diet, chooses low carb choices,  walking 4 days a week, going to the gym with her husband and weight is stable now. Started diet was May 1st  2020 her weight was 235 lbs and she was  down to 186 lbs , today weight is at 191  lbs. Continue life style modifications     ADHD: she states symptoms started in childhood, but was treated at age 62 , she has tried Adderal - caused anger outburst, felt tired at the end of the day, Strattera caused sedation, Ritalin caused palpitation. She has been on Vyvanse for the past four years and is working well for her . No side effects of medication, except is helps a little on her appetite She states her boss notices when she does not take the medication. She states her husband and children also noticed right away when she skips the dose. 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 Her parents were going through a divorce, mother left and stayed with her step-father, she has one  brother that died as an infant and another one that died from drug overdose at age 19 ( she was  20 ). There is a family history of depression. She was on Zoloft from age 10 until year 2019 when she was switched from Zoloft to [place] because she had a relapse. ( she also states while on zoloft tried some other SSRI - not sure of the name).  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 She stopped seeing psychiatrist because of turn over of providers. She stopped taking Viibrid on May 13 th because she forgot to take medication on vacation, she has been taking some oils and is doing well without it only taking hydroxizine to sleep now and doing well.    Migraine headaches: she states seldom has episodes, she states she has a tingling sensation on her scalp followed by a headache, it is described as a "zap" sensation . She has phonophobia and photophobia. She does not like taking Maxalt because of side effects, we will try ubrelvy Episodes about once a month. She has been drinking more water, less sodas and episodes are sporadic   Thyroid nodule: large and palpable on right side. She needs  to call and re-schedule follow up with Endo   Patient Active Problem List   Diagnosis Date Noted   Hypertension associated with type 2 diabetes mellitus ([place]) 12/09/2019   Diabetes mellitus type 2 in obese ([place]) 12/09/2019   Hyperlipidemia associated with type 2 diabetes mellitus ([place]) 07/02/2019   Urge incontinence 08/15/2018   Altered awareness, transient 06/17/2018   Headache disorder 06/17/2018   Family history of premature CAD 09/11/2017   Thyroid nodule 08/15/2017   LVH (left ventricular hypertrophy) 02/01/2017   Elevated serum glutamic pyruvic transaminase (SGPT) level 07/24/2016   Morbid obesity ([place]) 03/23/2016   Sinus tachycardia 02/24/2016   Valvular regurgitation 01/11/2016   ADHD (attention deficit hyperactivity disorder), combined type  08/30/2015   Generalized anxiety disorder 12/31/2014   Major depressive disorder, recurrent episode ([place]) 10/25/2014   Essential hypertension 10/25/2014    Past Surgical History:  Procedure Laterality Date   CESAREAN SECTION     thyroid nodule biopsy     TUBAL LIGATION      Family History  Problem Relation Age of Onset   Heart disease Mother    Hyperlipidemia Mother    Hypertension Mother    Miscarriages / [place] Mother        2   Anxiety disorder Mother    Coronary artery disease Mother 47       11 stents   Alcohol abuse Father    Cancer Father        testicular   Depression Father    Heart disease Sister    Hyperlipidemia Sister    Hypertension Sister    Other Sister         Langerhans Cell Histiocytosis   Drug abuse Brother    Alcohol abuse Brother    Depression Brother    ADD / ADHD Son    Mental illness Maternal Aunt    Alcohol abuse Maternal Grandfather    Depression Maternal Grandfather    Anxiety disorder Maternal Grandfather    Cancer Paternal Grandmother    Breast cancer Paternal Grandmother        late 70's   Alcohol abuse Paternal Grandfather     Social History   Tobacco Use   Smoking status: Former    Pack years: 0.00    Types: Cigarettes, E-cigarettes    Quit date: 06/29/2017    Years since quitting: 3.5   Smokeless tobacco: Never   Tobacco comments:    On and off since 51 yrs of age; now using e-cigarettes  Substance Use Topics   Alcohol use: Yes    Alcohol/week: 0.0 standard drinks    Comment: rare     Current Outpatient Medications:    aspirin EC 81 MG tablet, Take 1 tablet (81 mg total) by mouth daily., Disp: , Rfl:    atorvastatin (LIPITOR) 20 MG tablet, Take 1 tablet (20 mg total) by mouth daily., Disp: 90 tablet, Rfl: 1   Homeopathic Products (FRANKINCENSE UPLIFTING) OIL, Inhale 5 drops into the lungs daily., Disp: , Rfl:    hydrOXYzine (ATARAX/VISTARIL) 50 MG tablet, Take 1 tablet (50 mg total) by mouth at bedtime., Disp:  90 tablet, Rfl: 1   olmesartan (BENICAR) 20 MG tablet, Take 0.5-1 tablets (10-20 mg total) by mouth daily., Disp: 90 tablet, Rfl: 1   OZEMPIC, 0.25 OR 0.5 MG/DOSE, 2 MG/1.5ML SOPN, INJECT 0.5MG  UNDER THE SKIN EVERY WEEK, Disp: 4.5 mL, Rfl: 1   rizatriptan (MAXALT-MLT) 10 MG disintegrating tablet, Take 1 tablet (10 mg total) by mouth  as needed for migraine. May repeat in 2 hours if needed, Disp: 10 tablet, Rfl: 0   Ubrogepant (UBRELVY) 100 MG TABS, Take 1 tablet by mouth daily as needed., Disp: 9 tablet, Rfl: 1   lisdexamfetamine (VYVANSE) 50 MG capsule, Take 1 capsule (50 mg total) by mouth daily., Disp: 90 capsule, Rfl: 0  Allergies  Allergen Reactions   Penicillins Anaphylaxis   Latex Hives and Rash    I personally reviewed active problem list, medication list, allergies, family history, social history, health maintenance with the patient/caregiver today.   ROS  Constitutional: Negative for fever or weight change.  Respiratory: Negative for cough and shortness of breath.   Cardiovascular: Negative for chest pain or palpitations.  Gastrointestinal: Negative for abdominal pain, no bowel changes.  Musculoskeletal: Negative for gait problem or joint swelling.  Skin: Negative for rash.  Neurological: Negative for dizziness or headache.  No other specific complaints in a complete review of systems (except as listed in HPI above).   Objective  Vitals:   01/02/21 0917  BP: 126/72  Pulse: 87  Resp: 16  Temp: 98.1 F (36.7 C)  TempSrc: Rectal  SpO2: 99%  Weight: 191 lb (86.6 kg)  Height: 5\' 3"  (1.6 m)    Body mass index is 33.83 kg/m.  Physical Exam  Constitutional: Patient appears well-developed and well-nourished. Obese  No distress.  HEENT: head atraumatic, normocephalic, pupils equal and reactive to light, neck supple Cardiovascular: Normal rate, regular rhythm and normal heart sounds.  No murmur heard. No BLE edema. Pulmonary/Chest: Effort normal and breath sounds  normal. No respiratory distress. Abdominal: Soft.  There is no tenderness. Psychiatric: Patient has a normal mood and affect. behavior is normal. Judgment and thought content normal.   Recent Results (from the past 2160 hour(s))  HM DIABETES EYE EXAM     Status: None   Collection Time: 11/04/20 12:00 AM  Result Value Ref Range   HM Diabetic Eye Exam No Retinopathy No Retinopathy  POCT HgB A1C     Status: None   Collection Time: 01/02/21  9:20 AM  Result Value Ref Range   Hemoglobin A1C 5.5 4.0 - 5.6 %   HbA1c POC (<> result, manual entry)     HbA1c, POC (prediabetic range)     HbA1c, POC (controlled diabetic range)      Diabetic Foot Exam: Diabetic Foot Exam - Simple   Simple Foot Form Diabetic Foot exam was performed with the following findings: Yes 01/02/2021  9:47 AM  Visual Inspection No deformities, no ulcerations, no other skin breakdown bilaterally: Yes Sensation Testing Intact to touch and monofilament testing bilaterally: Yes Pulse Check Posterior Tibialis and Dorsalis pulse intact bilaterally: Yes Comments      PHQ2/9: Depression screen [facility] 2/9 01/02/2021 08/26/2020 05/25/2020 03/11/2020 12/09/2019  Decreased Interest 0 0 0 0 0  Down, Depressed, Hopeless 0 0 0 0 0  PHQ - 2 Score 0 0 0 0 0  Altered sleeping 0 1 1 0 0  Tired, decreased energy 0 0 0 0 0  Change in appetite 0 0 0 0 0  Feeling bad or failure about yourself  0 0 0 0 0  Trouble concentrating 0 0 0 0 1  Moving slowly or fidgety/restless 0 0 0 0 0  Suicidal thoughts 0 0 0 0 0  PHQ-9 Score 0 1 1 0 1  Difficult doing work/chores - - Not difficult at all - Somewhat difficult  Some recent data might be hidden  phq 9 is negative   Fall Risk: Fall Risk  01/02/2021 08/26/2020 05/25/2020 03/11/2020 12/09/2019  Falls in the past year? 0 0 0 0 0  Number falls in past yr: 0 0 0 0 0  Injury with Fall? 0 0 0 0 0  Follow up - - - - -     Functional Status Survey: Is the patient deaf or have difficulty  hearing?: No Does the patient have difficulty seeing, even when wearing glasses/contacts?: No Does the patient have difficulty concentrating, remembering, or making decisions?: No Does the patient have difficulty walking or climbing stairs?: No Does the patient have difficulty dressing or bathing?: No Does the patient have difficulty doing errands alone such as visiting a doctor's office or shopping?: No    Assessment & Plan   1. Diabetes mellitus type 2 in obese (HCC)  - POCT HgB A1C - HM Diabetes Foot Exam - Microalbumin / creatinine urine ratio - COMPLETE METABOLIC PANEL WITH GFR  2. Obesity   Doing well with life style modification   3. Controlled type 2 diabetes mellitus with complication, without long-term current use of insulin ([place])   4. Hyperlipidemia associated with type 2 diabetes mellitus ([place])  - Lipid panel  5. LVH (left ventricular hypertrophy)   6. Essential hypertension  - COMPLETE METABOLIC PANEL WITH GFR - CBC with Differential/Platelet  7. ADHD (attention deficit hyperactivity disorder), combined type  - lisdexamfetamine (VYVANSE) 50 MG capsule; Take 1 capsule (50 mg total) by mouth daily.  Dispense: 90 capsule; Refill: 0  8. Major depression in remission ([place])  Off medication  9. Migraine with aura and without status migrainosus, not intractable   10. Thyroid nodule  - TSH   11. Need for Tdap vaccination  - Tdap vaccine greater than or equal to 7yo IM

## 2021-01-02 ENCOUNTER — Ambulatory Visit (INDEPENDENT_AMBULATORY_CARE_PROVIDER_SITE_OTHER): Payer: 59 | Admitting: Family Medicine

## 2021-01-02 ENCOUNTER — Encounter: Payer: Self-pay | Admitting: Family Medicine

## 2021-01-02 ENCOUNTER — Other Ambulatory Visit: Payer: Self-pay

## 2021-01-02 VITALS — BP 126/72 | HR 87 | Temp 98.1°F | Resp 16 | Ht 64.0 in | Wt 191.0 lb

## 2021-01-02 DIAGNOSIS — I517 Cardiomegaly: Secondary | ICD-10-CM

## 2021-01-02 DIAGNOSIS — E1169 Type 2 diabetes mellitus with other specified complication: Secondary | ICD-10-CM | POA: Diagnosis not present

## 2021-01-02 DIAGNOSIS — E118 Type 2 diabetes mellitus with unspecified complications: Secondary | ICD-10-CM

## 2021-01-02 DIAGNOSIS — E669 Obesity, unspecified: Secondary | ICD-10-CM

## 2021-01-02 DIAGNOSIS — E785 Hyperlipidemia, unspecified: Secondary | ICD-10-CM

## 2021-01-02 DIAGNOSIS — G43109 Migraine with aura, not intractable, without status migrainosus: Secondary | ICD-10-CM

## 2021-01-02 DIAGNOSIS — Z23 Encounter for immunization: Secondary | ICD-10-CM

## 2021-01-02 DIAGNOSIS — E66811 Obesity, class 1: Secondary | ICD-10-CM

## 2021-01-02 DIAGNOSIS — F325 Major depressive disorder, single episode, in full remission: Secondary | ICD-10-CM

## 2021-01-02 DIAGNOSIS — I10 Essential (primary) hypertension: Secondary | ICD-10-CM

## 2021-01-02 DIAGNOSIS — E041 Nontoxic single thyroid nodule: Secondary | ICD-10-CM

## 2021-01-02 DIAGNOSIS — F902 Attention-deficit hyperactivity disorder, combined type: Secondary | ICD-10-CM

## 2021-01-02 LAB — POCT GLYCOSYLATED HEMOGLOBIN (HGB A1C): Hemoglobin A1C: 5.5 % (ref 4.0–5.6)

## 2021-01-02 MED ORDER — LISDEXAMFETAMINE DIMESYLATE 50 MG PO CAPS: 50.0000 mg | ORAL_CAPSULE | Freq: Every day | ORAL | 0 refills | Status: DC

## 2021-01-03 LAB — COMPLETE METABOLIC PANEL WITH GFR
AG Ratio: 1.8 (calc) (ref 1.0–2.5)
ALT: 18 U/L (ref 6–29)
AST: 18 U/L (ref 10–30)
Albumin: 4.6 g/dL (ref 3.6–5.1)
Alkaline phosphatase (APISO): 72 U/L (ref 31–125)
BUN: 9 mg/dL (ref 7–25)
CO2: 30 mmol/L (ref 20–32)
Calcium: 9.8 mg/dL (ref 8.6–10.2)
Chloride: 107 mmol/L (ref 98–110)
Creat: 0.72 mg/dL (ref 0.50–1.10)
GFR, Est African American: 120 mL/min/{1.73_m2} (ref >=60)
GFR, Est Non African American: 103 mL/min/{1.73_m2} (ref >=60)
Globulin: 2.6 g/dL (calc) (ref 1.9–3.7)
Glucose, Bld: 101 mg/dL — ABNORMAL HIGH (ref 65–99)
Potassium: 4.5 mmol/L (ref 3.5–5.3)
Sodium: 143 mmol/L (ref 135–146)
Total Bilirubin: 1 mg/dL (ref 0.2–1.2)
Total Protein: 7.2 g/dL (ref 6.1–8.1)

## 2021-01-03 LAB — CBC WITH DIFFERENTIAL/PLATELET
Absolute Monocytes: 492 cells/uL (ref 200–950)
Basophils Absolute: 30 cells/uL (ref 0–200)
Basophils Relative: 0.5 %
Eosinophils Absolute: 78 cells/uL (ref 15–500)
Eosinophils Relative: 1.3 %
HCT: 42.9 % (ref 35.0–45.0)
Hemoglobin: 14.1 g/dL (ref 11.7–15.5)
Lymphs Abs: 2592 cells/uL (ref 850–3900)
MCH: 28.5 pg (ref 27.0–33.0)
MCHC: 32.9 g/dL (ref 32.0–36.0)
MCV: 86.7 fL (ref 80.0–100.0)
MPV: 10.8 fL (ref 7.5–12.5)
Monocytes Relative: 8.2 %
Neutro Abs: 2808 cells/uL (ref 1500–7800)
Neutrophils Relative %: 46.8 %
Platelets: 324 10*3/uL (ref 140–400)
RBC: 4.95 10*6/uL (ref 3.80–5.10)
RDW: 12.6 % (ref 11.0–15.0)
Total Lymphocyte: 43.2 %
WBC: 6 10*3/uL (ref 3.8–10.8)

## 2021-01-03 LAB — MICROALBUMIN / CREATININE URINE RATIO
Creatinine, Urine: 222 mg/dL (ref 20–275)
Microalb Creat Ratio: 5 mcg/mg creat (ref <30)
Microalb, Ur: 1.2 mg/dL

## 2021-01-03 LAB — LIPID PANEL
Cholesterol: 155 mg/dL (ref <200)
HDL: 36 mg/dL — ABNORMAL LOW (ref >=50)
LDL Cholesterol (Calc): 92 mg/dL (calc)
Non-HDL Cholesterol (Calc): 119 mg/dL (calc) (ref <130)
Total CHOL/HDL Ratio: 4.3 (calc) (ref <5.0)
Triglycerides: 168 mg/dL — ABNORMAL HIGH (ref <150)

## 2021-01-03 LAB — TSH: TSH: 1.54 mIU/L

## 2021-01-08 ENCOUNTER — Other Ambulatory Visit: Payer: Self-pay | Admitting: Family Medicine

## 2021-01-08 NOTE — Telephone Encounter (Signed)
dc'd 01/02/21 DC'd "error" Carlene Coria CMA

## 2021-01-31 ENCOUNTER — Other Ambulatory Visit: Payer: Self-pay | Admitting: Family Medicine

## 2021-01-31 DIAGNOSIS — I10 Essential (primary) hypertension: Secondary | ICD-10-CM

## 2021-01-31 DIAGNOSIS — I517 Cardiomegaly: Secondary | ICD-10-CM

## 2021-01-31 DIAGNOSIS — E119 Type 2 diabetes mellitus without complications: Secondary | ICD-10-CM

## 2021-01-31 DIAGNOSIS — E782 Mixed hyperlipidemia: Secondary | ICD-10-CM

## 2021-01-31 NOTE — Telephone Encounter (Signed)
Requested Prescriptions  Pending Prescriptions Disp Refills  . metoprolol succinate (TOPROL-XL) 25 MG 24 hr tablet [Pharmacy Med Name: METOPROLOL SUCC ER 25 MG TAB[*]] 90 tablet 0    Sig: TAKE ONE TABLET BY MOUTH ONE TIME DAILY     Cardiovascular:  Beta Blockers Passed - 01/31/2021  2:05 AM      Passed - Last BP in normal range    BP Readings from Last 1 Encounters:  01/02/21 126/72         Passed - Last Heart Rate in normal range    Pulse Readings from Last 1 Encounters:  01/02/21 87         Passed - Valid encounter within last 6 months    Recent Outpatient Visits          4 weeks ago Diabetes mellitus type 2 in obese [facility]   [facility] [place], Drue Stager, MD   5 months ago Diabetes mellitus type 2 in obese [facility])   [facility] [place], Drue Stager, MD   8 months ago Diabetes mellitus type 2 in obese [facility])   [facility] [place], Drue Stager, MD   10 months ago Diabetes mellitus type 2 in obese [facility])   [facility] Steele Sizer, MD   1 year ago Hypertension associated with diabetes [facility])   [facility] Steele Sizer, MD      Future Appointments            In 2 months Ancil Boozer, Drue Stager, MD [facility], [place]   In 3 months Steele Sizer, MD [facility], [place]           . atorvastatin (LIPITOR) 20 MG tablet [Pharmacy Med Name: ATORVASTATIN 20 MG TAB[*]] 90 tablet 1    Sig: TAKE ONE TABLET BY MOUTH ONE TIME DAILY     Cardiovascular:  Antilipid - Statins Failed - 01/31/2021  2:05 AM      Failed - HDL in normal range and within 360 days    HDL  Date Value Ref Range Status  01/02/2021 36 (L) > OR = 50 mg/dL Final         Failed - Triglycerides in normal range and within 360 days    Triglycerides  Date Value Ref Range Status  01/02/2021 168 (H) <150 mg/dL Final         Passed - Total Cholesterol in normal range and within 360 days     Cholesterol  Date Value Ref Range Status  01/02/2021 155 <200 mg/dL Final         Passed - LDL in normal range and within 360 days    LDL Cholesterol (Calc)  Date Value Ref Range Status  01/02/2021 92 mg/dL (calc) Final    Comment:    Reference range: <100 . Desirable range <100 mg/dL for primary prevention;   <70 mg/dL for patients with CHD or diabetic patients  with > or = 2 CHD risk factors. Marland Kitchen LDL-C is now calculated using the Martin-Hopkins  calculation, which is a validated novel method providing  better accuracy than the Friedewald equation in the  estimation of LDL-C.  Cresenciano Genre et al. Annamaria Helling. 2263;335(45): 2061-2068  (http://education.[employer].com/faq/FAQ164)          Passed - Patient is not pregnant      Passed - Valid encounter within last 12 months    Recent Outpatient Visits          4 weeks ago  Diabetes mellitus type 2 in obese [facility])   [facility] [place], Drue Stager, MD   5 months ago Diabetes mellitus type 2 in obese [facility])   [facility] [place], Drue Stager, MD   8 months ago Diabetes mellitus type 2 in obese [facility])   [facility] Steele Sizer, MD   10 months ago Diabetes mellitus type 2 in obese [facility])   [facility] Steele Sizer, MD   1 year ago Hypertension associated with diabetes [facility])   [facility] Steele Sizer, MD      Future Appointments            In 2 months Ancil Boozer, Drue Stager, MD [facility], [place]   In 3 months Steele Sizer, MD [facility], [facility]

## 2021-02-26 ENCOUNTER — Other Ambulatory Visit: Payer: Self-pay | Admitting: Family Medicine

## 2021-02-26 DIAGNOSIS — I10 Essential (primary) hypertension: Secondary | ICD-10-CM

## 2021-02-26 NOTE — Telephone Encounter (Signed)
Requested Prescriptions  Pending Prescriptions Disp Refills  . olmesartan (BENICAR) 20 MG tablet [Pharmacy Med Name: OLMESARTAN 20 MG TAB[*]] 90 tablet 1    Sig: TAKE 1/2 TO 1 TABLET BY MOUTH DAILY     Cardiovascular:  Angiotensin Receptor Blockers Passed - 02/26/2021  2:18 PM      Passed - Cr in normal range and within 180 days    Creat  Date Value Ref Range Status  01/02/2021 0.72 0.50 - 1.10 mg/dL Final   Creatinine, Urine  Date Value Ref Range Status  01/02/2021 222 20 - 275 mg/dL Final         Passed - K in normal range and within 180 days    Potassium  Date Value Ref Range Status  01/02/2021 4.5 3.5 - 5.3 mmol/L Final  08/18/2014 3.7 3.5 - 5.1 mmol/L Final         Passed - Patient is not pregnant      Passed - Last BP in normal range    BP Readings from Last 1 Encounters:  01/02/21 126/72         Passed - Valid encounter within last 6 months    Recent Outpatient Visits          1 month ago Diabetes mellitus type 2 in obese [facility])   [facility] Steele Sizer, MD   6 months ago Diabetes mellitus type 2 in obese [facility])   [facility] Steele Sizer, MD   9 months ago Diabetes mellitus type 2 in obese [facility])   [facility] Steele Sizer, MD   11 months ago Diabetes mellitus type 2 in obese [facility])   [facility] Steele Sizer, MD   1 year ago Hypertension associated with diabetes [facility])   [facility] Steele Sizer, MD      Future Appointments            In 1 month Ancil Boozer, Drue Stager, MD [facility], [place]   In 2 months Steele Sizer, MD [facility], [facility] [place]

## 2021-04-07 NOTE — Progress Notes (Signed)
Name: Melanie Villarreal   MRN: [id number]    DOB: [birth year]   Date:04/10/2021       Progress Note  Subjective  Chief Complaint  Follow Up  HPI  DMII: she has associated hypertension, dyslipidemia, obese, she also has neuropahty. She has been on Ozempic since May 2020  She denies polyphagia, polydipsia or polyuria . A1C is still at goal at 5.6 %, she tried going down on dose of Ozempic from 1 mg to 0.5 mg but gained weight and went back to 1 mg dose, no hypoglycemic episodes.  She is on Atorvastatin for dyslipidemia , back on full dose.   Dyslipidemia: Last LDL was up from 52 to 92, triglycerides also higher at 168 and HDL stable at 36 . She is now taking a full dose of Atorvastatin daily    HTN: bp is at goal today, she has been half dose of Benicar ( down from 20 mg to 10 mg ) she had stopped Metoprolol for a few months but she states she has been using Metoprolol prn now , she had an episode that lasted over one hour with heart rate around 130's back in June 2022, she stopped drinking caffeine and had to take metoprolol twice since that date and controlled symptoms, she will follow up with cardiologist..Denies chest pain or SOB.     Morbid Obesity: she has been obese since age 23. She states worse since the last pregnancy. She was on a  glutten free diet, chooses low carb choices,  walking 4 days a week, going to the gym with her husband and weight is stable now. Started diet was May 1st  2020 her weight was 235 lbs and she was  down to 186 lbs, weight is now stable in the low 190's lbs.   ADHD: she states symptoms started in childhood, but was treated at age 20 , she has tried Adderal - caused anger outburst, felt tired at the end of the day, Strattera caused sedation, Ritalin caused palpitation. She has been on Vyvanse for the past four years and is working well for her . No side effects of medication, except is helps a little on her appetite She states her boss notices when she does not take the  medication. She states her husband and children also noticed right away when she skips the dose. She has been getting 90 day supply of 50 mg dose. Unchanged and needs refills.    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 Her parents were going through a divorce, mother left and stayed with her step-father, she has one brother that died as an infant and another one that died from drug overdose at age 60 ( she was  6 ). There is a family history of depression. She was on Zoloft from age 28 until year 2019 when she was switched from Zoloft to Noonday because she had a relapse. ( she also states while on zoloft tried some other SSRI - not sure of the name).  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 She stopped seeing psychiatrist because of turn over of providers. She stopped taking Viibrid on May 13 th because she forgot to take medication on vacation, she has been taking some oils and is doing well without  daily medication but still takes hydroxyzine prn    Migraine headaches: she states seldom has episodes, she states  she has a tingling sensation on her scalp followed by a headache, it is described as a "zap" sensation . She has phonophobia and photophobia. She does not like taking Maxalt because of side effects, we gave her a rx Ubrelvy on her last visit but no episodes since . She has been off caffeine   Thyroid nodule: large and palpable on right side. She states she will contact Endo   Patient Active Problem List   Diagnosis Date Noted   Hypertension associated with type 2 diabetes mellitus ([place]) 12/09/2019   Diabetes mellitus type 2 in obese ([place]) 12/09/2019   Hyperlipidemia associated with type 2 diabetes mellitus ([place]) 07/02/2019   Urge incontinence 08/15/2018   Altered awareness, transient 06/17/2018   Headache disorder 06/17/2018   Family history of premature CAD 09/11/2017   Thyroid  nodule 08/15/2017   LVH (left ventricular hypertrophy) 02/01/2017   Elevated serum glutamic pyruvic transaminase (SGPT) level 07/24/2016   Morbid obesity ([place]) 03/23/2016   Sinus tachycardia 02/24/2016   Valvular regurgitation 01/11/2016   ADHD (attention deficit hyperactivity disorder), combined type 08/30/2015   Generalized anxiety disorder 12/31/2014   Major depressive disorder, recurrent episode ([place]) 10/25/2014   Essential hypertension 10/25/2014    Past Surgical History:  Procedure Laterality Date   CESAREAN SECTION     thyroid nodule biopsy     TUBAL LIGATION      Family History  Problem Relation Age of Onset   Heart disease Mother    Hyperlipidemia Mother    Hypertension Mother    Miscarriages / [place] Mother        2   Anxiety disorder Mother    Coronary artery disease Mother 62       11 stents   Alcohol abuse Father    Cancer Father        testicular   Depression Father    Heart disease Sister    Hyperlipidemia Sister    Hypertension Sister    Other Sister         Langerhans Cell Histiocytosis   Drug abuse Brother    Alcohol abuse Brother    Depression Brother    ADD / ADHD Son    Mental illness Maternal Aunt    Alcohol abuse Maternal Grandfather    Depression Maternal Grandfather    Anxiety disorder Maternal Grandfather    Cancer Paternal Grandmother    Breast cancer Paternal Grandmother        late 70's   Alcohol abuse Paternal Grandfather     Social History   Tobacco Use   Smoking status: Former    Types: Cigarettes, E-cigarettes    Quit date: 06/29/2017    Years since quitting: 3.7   Smokeless tobacco: Never   Tobacco comments:    On and off since 49 yrs of age; now using e-cigarettes  Substance Use Topics   Alcohol use: Yes    Alcohol/week: 0.0 standard drinks    Comment: rare     Current Outpatient Medications:    aspirin EC 81 MG tablet, Take 1 tablet (81 mg total) by mouth daily., Disp: , Rfl:    atorvastatin (LIPITOR) 20  MG tablet, TAKE ONE TABLET BY MOUTH ONE TIME DAILY, Disp: 90 tablet, Rfl: 1   Homeopathic Products (FRANKINCENSE UPLIFTING) OIL, Inhale 5 drops into the lungs daily., Disp: , Rfl:    hydrOXYzine (ATARAX/VISTARIL) 50 MG tablet, Take 1 tablet (50 mg total) by mouth at bedtime., Disp: 90 tablet, Rfl: 1   lisdexamfetamine (  VYVANSE) 50 MG capsule, Take 1 capsule (50 mg total) by mouth daily., Disp: 90 capsule, Rfl: 0   olmesartan (BENICAR) 20 MG tablet, TAKE 1/2 TO 1 TABLET BY MOUTH DAILY, Disp: 90 tablet, Rfl: 1   OZEMPIC, 0.25 OR 0.5 MG/DOSE, 2 MG/1.5ML SOPN, INJECT 0.5MG  UNDER THE SKIN EVERY WEEK, Disp: 4.5 mL, Rfl: 1   rizatriptan (MAXALT-MLT) 10 MG disintegrating tablet, Take 1 tablet (10 mg total) by mouth as needed for migraine. May repeat in 2 hours if needed, Disp: 10 tablet, Rfl: 0   Ubrogepant (UBRELVY) 100 MG TABS, Take 1 tablet by mouth daily as needed., Disp: 9 tablet, Rfl: 1  Allergies  Allergen Reactions   Penicillins Anaphylaxis   Latex Hives and Rash    I personally reviewed active problem list, medication list, allergies, family history, social history, health maintenance with the patient/caregiver today.   ROS  Constitutional: Negative for fever or weight change.  Respiratory: Negative for cough and shortness of breath.   Cardiovascular: Negative for chest pain , occasionally has palpitations.  Gastrointestinal: Negative for abdominal pain, no bowel changes.  Musculoskeletal: Negative for gait problem or joint swelling.  Skin: Negative for rash.  Neurological: Negative for dizziness or headache.  No other specific complaints in a complete review of systems (except as listed in HPI above).   Objective  Vitals:   04/10/21 1038  BP: 122/82  Pulse: 90  Resp: 16  Temp: 98.1 F (36.7 C)  SpO2: 99%  Weight: 193 lb (87.5 kg)  Height: 5\' 4"  (1.626 m)    Body mass index is 33.13 kg/m.  Physical Exam  Constitutional: Patient appears well-developed and  well-nourished. Obese  No distress.  HEENT: head atraumatic, normocephalic, pupils equal and reactive to light, neck Cardiovascular: Normal rate, regular rhythm and normal heart sounds.  No murmur heard. No BLE edema. Pulmonary/Chest: Effort normal and breath sounds normal. No respiratory distress. Abdominal: Soft.  There is no tenderness. Psychiatric: Patient has a normal mood and affect. behavior is normal. Judgment and thought content normal.   Recent Results (from the past 2160 hour(s))  POCT HgB A1C     Status: None   Collection Time: 04/10/21 10:48 AM  Result Value Ref Range   Hemoglobin A1C 5.6 4.0 - 5.6 %   HbA1c POC (<> result, manual entry)     HbA1c, POC (prediabetic range)     HbA1c, POC (controlled diabetic range)       PHQ2/9: Depression screen [facility] 2/9 04/10/2021 01/02/2021 08/26/2020 05/25/2020 03/11/2020  Decreased Interest 0 0 0 0 0  Down, Depressed, Hopeless 0 0 0 0 0  PHQ - 2 Score 0 0 0 0 0  Altered sleeping 0 0 1 1 0  Tired, decreased energy 0 0 0 0 0  Change in appetite 0 0 0 0 0  Feeling bad or failure about yourself  0 0 0 0 0  Trouble concentrating 0 0 0 0 0  Moving slowly or fidgety/restless 0 0 0 0 0  Suicidal thoughts 0 0 0 0 0  PHQ-9 Score 0 0 1 1 0  Difficult doing work/chores - - - Not difficult at all -  Some recent data might be hidden    phq 9 is negative   Fall Risk: Fall Risk  04/10/2021 01/02/2021 08/26/2020 05/25/2020 03/11/2020  Falls in the past year? 0 0 0 0 0  Number falls in past yr: 0 0 0 0 0  Injury with Fall? 0 0 0 0  0  Risk for fall due to : No Fall Risks - - - -  Follow up Falls prevention discussed - - - -      Functional Status Survey: Is the patient deaf or have difficulty hearing?: No Does the patient have difficulty seeing, even when wearing glasses/contacts?: No Does the patient have difficulty concentrating, remembering, or making decisions?: No Does the patient have difficulty walking or climbing stairs?: No Does the  patient have difficulty dressing or bathing?: No Does the patient have difficulty doing errands alone such as visiting a doctor's office or shopping?: No    Assessment & Plan  1. Diabetes mellitus type 2 in obese (HCC)  - POCT HgB A1C - Semaglutide, 1 MG/DOSE, 4 MG/3ML SOPN; Inject 1 mg as directed once a week.  Dispense: 9 mL; Refill: 1  2. Essential hypertension  She takes half to one daily   3. Hyperlipidemia associated with type 2 diabetes mellitus ([place])   4. ADHD (attention deficit hyperactivity disorder), combined type  - lisdexamfetamine (VYVANSE) 50 MG capsule; Take 1 capsule (50 mg total) by mouth daily.  Dispense: 90 capsule; Refill: 0  5. Major depression in remission (HCC)  - hydrOXYzine (ATARAX/VISTARIL) 50 MG tablet; Take 1 tablet (50 mg total) by mouth at bedtime.  Dispense: 90 tablet; Refill: 1  6. Thyroid nodule   7. Migraine with aura and without status migrainosus, not intractable   8. Anxiety  - hydrOXYzine (ATARAX/VISTARIL) 50 MG tablet; Take 1 tablet (50 mg total) by mouth at bedtime.  Dispense: 90 tablet; Refill: 1

## 2021-04-10 ENCOUNTER — Ambulatory Visit (INDEPENDENT_AMBULATORY_CARE_PROVIDER_SITE_OTHER): Payer: 59 | Admitting: Family Medicine

## 2021-04-10 ENCOUNTER — Encounter: Payer: Self-pay | Admitting: Family Medicine

## 2021-04-10 ENCOUNTER — Other Ambulatory Visit: Payer: Self-pay

## 2021-04-10 VITALS — BP 122/82 | HR 90 | Temp 98.1°F | Resp 16 | Ht 64.0 in | Wt 193.0 lb

## 2021-04-10 DIAGNOSIS — G43109 Migraine with aura, not intractable, without status migrainosus: Secondary | ICD-10-CM

## 2021-04-10 DIAGNOSIS — F419 Anxiety disorder, unspecified: Secondary | ICD-10-CM

## 2021-04-10 DIAGNOSIS — E669 Obesity, unspecified: Secondary | ICD-10-CM | POA: Diagnosis not present

## 2021-04-10 DIAGNOSIS — I10 Essential (primary) hypertension: Secondary | ICD-10-CM

## 2021-04-10 DIAGNOSIS — E041 Nontoxic single thyroid nodule: Secondary | ICD-10-CM

## 2021-04-10 DIAGNOSIS — E1169 Type 2 diabetes mellitus with other specified complication: Secondary | ICD-10-CM | POA: Diagnosis not present

## 2021-04-10 DIAGNOSIS — F325 Major depressive disorder, single episode, in full remission: Secondary | ICD-10-CM | POA: Diagnosis not present

## 2021-04-10 DIAGNOSIS — F902 Attention-deficit hyperactivity disorder, combined type: Secondary | ICD-10-CM | POA: Diagnosis not present

## 2021-04-10 DIAGNOSIS — E785 Hyperlipidemia, unspecified: Secondary | ICD-10-CM

## 2021-04-10 LAB — POCT GLYCOSYLATED HEMOGLOBIN (HGB A1C): Hemoglobin A1C: 5.6 % (ref 4.0–5.6)

## 2021-04-10 MED ORDER — LISDEXAMFETAMINE DIMESYLATE 50 MG PO CAPS: 50.0000 mg | ORAL_CAPSULE | Freq: Every day | ORAL | 0 refills | Status: DC

## 2021-04-10 MED ORDER — SEMAGLUTIDE (1 MG/DOSE) 4 MG/3ML ~~LOC~~ SOPN
1.0000 mg | PEN_INJECTOR | SUBCUTANEOUS | 1 refills | Status: DC
Start: 2021-04-10 — End: 2021-09-01

## 2021-04-10 MED ORDER — HYDROXYZINE HCL 50 MG PO TABS: 50.0000 mg | ORAL_TABLET | Freq: Every evening | ORAL | 1 refills | Status: DC

## 2021-05-01 ENCOUNTER — Telehealth: Payer: Self-pay | Admitting: Family Medicine

## 2021-05-01 DIAGNOSIS — F419 Anxiety disorder, unspecified: Secondary | ICD-10-CM

## 2021-05-01 DIAGNOSIS — F325 Major depressive disorder, single episode, in full remission: Secondary | ICD-10-CM

## 2021-05-19 NOTE — Progress Notes (Signed)
Name: Melanie Villarreal   MRN: [id number]    DOB: 02-14-1976   Date:05/22/2021       Progress Note  Subjective  Chief Complaint  Chief Complaint  Patient presents with   Annual Exam    HPI  Patient presents for annual CPE.  Diet: high in fiber, lean meat, vegetables and fruit  Exercise:  continue regular activity   [place] Office Visit from 05/22/2021 in [facility]  AUDIT-C Score 1      Depression: Phq 9 is  negative Depression screen [facility] 2/9 05/22/2021 04/10/2021 01/02/2021 08/26/2020 05/25/2020  Decreased Interest 0 0 0 0 0  Down, Depressed, Hopeless 0 0 0 0 0  PHQ - 2 Score 0 0 0 0 0  Altered sleeping 0 0 0 1 1  Tired, decreased energy 0 0 0 0 0  Change in appetite 0 0 0 0 0  Feeling bad or failure about yourself  0 0 0 0 0  Trouble concentrating 0 0 0 0 0  Moving slowly or fidgety/restless 0 0 0 0 0  Suicidal thoughts 0 0 0 0 0  PHQ-9 Score 0 0 0 1 1  Difficult doing work/chores - - - - Not difficult at all  Some recent data might be hidden   Hypertension: BP Readings from Last 3 Encounters:  05/22/21 130/82  04/10/21 122/82  01/02/21 126/72   Obesity: Wt Readings from Last 3 Encounters:  05/22/21 191 lb (86.6 kg)  04/10/21 193 lb (87.5 kg)  01/02/21 191 lb (86.6 kg)   BMI Readings from Last 3 Encounters:  05/22/21 32.79 kg/m  04/10/21 33.13 kg/m  01/02/21 32.79 kg/m     Vaccines:   HPV: she said she got it a long time ago  Pneumonia: educated and discussed with patient. Flu: educated and discussed with patient.  Hep C Screening: 05/25/20 STD testing and prevention (HIV/chl/gon/syphilis): 01/20/18 Intimate partner violence: negative Sexual History : one sexual partner, no vaginal discharge, no pain during intercourse or post coital bleeding  Menstrual History/LMP/Abnormal Bleeding: monthly but usually very light , had endometrial ablation  Incontinence Symptoms: occasionally when bladder is full , but just urgency   Breast  cancer:  - Last Mammogram: 08/2019  - BRCA gene screening: grandmother breast late 70's, father testicular cancer in his late 66's,   Osteoporosis: Discussed high calcium and vitamin D supplementation, weight bearing exercises  Cervical cancer screening: Today  Skin cancer: Discussed monitoring for atypical lesions  Colorectal cancer: N/A   Lung cancer: Low Dose CT Chest recommended if Age 86-80 years, 20 pack-year currently smoking OR have quit w/in 15years. Patient does not qualify.   ECG: 09/11/17  Advanced Care Planning: A voluntary discussion about advance care planning including the explanation and discussion of advance directives.  Discussed health care proxy and Living will, and the patient was able to identify a health care proxy as husband   Lipids: Lab Results  Component Value Date   CHOL 155 01/02/2021   CHOL 108 12/09/2019   CHOL 112 11/14/2018   Lab Results  Component Value Date   HDL 36 (L) 01/02/2021   HDL 33 (L) 12/09/2019   HDL 29 (L) 11/14/2018   Lab Results  Component Value Date   LDLCALC 92 01/02/2021   LDLCALC 52 12/09/2019   LDLCALC 56 11/14/2018   Lab Results  Component Value Date   TRIG 168 (H) 01/02/2021   TRIG 157 (H) 12/09/2019   TRIG 199 (H) 11/14/2018  Lab Results  Component Value Date   CHOLHDL 4.3 01/02/2021   CHOLHDL 3.3 12/09/2019   CHOLHDL 3.9 11/14/2018   No results found for: LDLDIRECT  Glucose: Glucose  Date Value Ref Range Status  08/18/2014 135 (H) 65 - 99 mg/dL Final   Glucose, Bld  Date Value Ref Range Status  01/02/2021 101 (H) 65 - 99 mg/dL Final    Comment:    .            Fasting reference interval . For someone without known diabetes, a glucose value between 100 and 125 mg/dL is consistent with prediabetes and should be confirmed with a follow-up test. .   12/09/2019 101 (H) 65 - 99 mg/dL Final    Comment:    .            Fasting reference interval . For someone without known diabetes, a glucose  value between 100 and 125 mg/dL is consistent with prediabetes and should be confirmed with a follow-up test. .   03/31/2019 120 (H) 65 - 99 mg/dL Final    Comment:    .            Fasting reference interval . For someone without known diabetes, a glucose value between 100 and 125 mg/dL is consistent with prediabetes and should be confirmed with a follow-up test. .     Patient Active Problem List   Diagnosis Date Noted   Hypertension associated with type 2 diabetes mellitus ([place]) 12/09/2019   Diabetes mellitus type 2 in obese ([place]) 12/09/2019   Hyperlipidemia associated with type 2 diabetes mellitus ([place]) 07/02/2019   Urge incontinence 08/15/2018   Altered awareness, transient 06/17/2018   Headache disorder 06/17/2018   Family history of premature CAD 09/11/2017   Thyroid nodule 08/15/2017   LVH (left ventricular hypertrophy) 02/01/2017   Elevated serum glutamic pyruvic transaminase (SGPT) level 07/24/2016   Morbid obesity ([place]) 03/23/2016   Sinus tachycardia 02/24/2016   Valvular regurgitation 01/11/2016   ADHD (attention deficit hyperactivity disorder), combined type 08/30/2015   Generalized anxiety disorder 12/31/2014   Major depressive disorder, recurrent episode ([place]) 10/25/2014   Essential hypertension 10/25/2014    Past Surgical History:  Procedure Laterality Date   CESAREAN SECTION     thyroid nodule biopsy     TUBAL LIGATION      Family History  Problem Relation Age of Onset   Heart disease Mother    Hyperlipidemia Mother    Hypertension Mother    Miscarriages / [place] Mother        2   Anxiety disorder Mother    Coronary artery disease Mother 20       11 stents   Alcohol abuse Father    Cancer Father        testicular   Depression Father    Heart disease Sister    Hyperlipidemia Sister    Hypertension Sister    Other Sister         Langerhans Cell Histiocytosis   Drug abuse Brother    Alcohol abuse Brother    Depression Brother     ADD / ADHD Son    Mental illness Maternal Aunt    Alcohol abuse Maternal Grandfather    Depression Maternal Grandfather    Anxiety disorder Maternal Grandfather    Cancer Paternal Grandmother    Breast cancer Paternal Grandmother        late 70's   Alcohol abuse Paternal Grandfather     Social History  Socioeconomic History   Marital status: Married    Spouse name: Erlene Quan   Number of children: 3   Years of education: 2 Masters   Highest education level: Master's degree (e.g., MA, MS, MEng, MEd, MSW, MBA)  Occupational History   Not on file  Tobacco Use   Smoking status: Former    Packs/day: 0.50    Types: Cigarettes, E-cigarettes    Start date: 09/14/1995    Quit date: 01/10/2021    Years since quitting: 0.3   Smokeless tobacco: Never   Tobacco comments:    On an off over the years. She smoked for 12 years solid, otherwise on and off   Vaping Use   Vaping Use: Former   Start date: 06/22/2017   Quit date: 11/13/2017  Substance and Sexual Activity   Alcohol use: Yes    Alcohol/week: 0.0 standard drinks    Comment: rare   Drug use: No   Sexual activity: Yes    Partners: Male    Birth control/protection: Surgical  Other Topics Concern   Not on file  Social History Narrative   Not on file   Social Determinants of Health   Financial Resource Strain: Low Risk    Difficulty of Paying Living Expenses: Not hard at all  Food Insecurity: No Food Insecurity   Worried About Charity fundraiser in the Last Year: Never true   [place] in the Last Year: Never true  Transportation Needs: No Transportation Needs   Lack of Transportation (Medical): No   Lack of Transportation (Non-Medical): No  Physical Activity: Sufficiently Active   Days of Exercise per Week: 4 days   Minutes of Exercise per Session: 40 min  Stress: No Stress Concern Present   Feeling of Stress : Not at all  Social Connections: Moderately Integrated   Frequency of Communication with Friends and  Family: More than three times a week   Frequency of Social Gatherings with Friends and Family: More than three times a week   Attends Religious Services: More than 4 times per year   Active Member of [employer] or Organizations: No   Attends Archivist Meetings: Never   Marital Status: Married  Human resources officer Violence: Not At Risk   Fear of Current or Ex-Partner: No   Emotionally Abused: No   Physically Abused: No   Sexually Abused: No     Current Outpatient Medications:    aspirin EC 81 MG tablet, Take 1 tablet (81 mg total) by mouth daily., Disp: , Rfl:    atorvastatin (LIPITOR) 20 MG tablet, TAKE ONE TABLET BY MOUTH ONE TIME DAILY, Disp: 90 tablet, Rfl: 1   Homeopathic Products (FRANKINCENSE UPLIFTING) OIL, Inhale 5 drops into the lungs daily., Disp: , Rfl:    hydrOXYzine (ATARAX/VISTARIL) 50 MG tablet, TAKE ONE TABLET BY MOUTH AT BEDTIME, Disp: 90 tablet, Rfl: 0   lisdexamfetamine (VYVANSE) 50 MG capsule, Take 1 capsule (50 mg total) by mouth daily., Disp: 90 capsule, Rfl: 0   olmesartan (BENICAR) 20 MG tablet, TAKE 1/2 TO 1 TABLET BY MOUTH DAILY, Disp: 90 tablet, Rfl: 1   rizatriptan (MAXALT-MLT) 10 MG disintegrating tablet, Take 1 tablet (10 mg total) by mouth as needed for migraine. May repeat in 2 hours if needed, Disp: 10 tablet, Rfl: 0   Semaglutide, 1 MG/DOSE, 4 MG/3ML SOPN, Inject 1 mg as directed once a week., Disp: 9 mL, Rfl: 1   Ubrogepant (UBRELVY) 100 MG TABS, Take 1 tablet by mouth  daily as needed., Disp: 9 tablet, Rfl: 1  Allergies  Allergen Reactions   Penicillins Anaphylaxis   Latex Hives and Rash     ROS  Constitutional: Negative for fever or weight change.  Respiratory: Negative for cough and shortness of breath.   Cardiovascular: Negative for chest pain or palpitations.  Gastrointestinal: Negative for abdominal pain, no bowel changes.  Musculoskeletal: Negative for gait problem or joint swelling.  Skin: Negative for rash.  Neurological: Negative  for dizziness or headache.  No other specific complaints in a complete review of systems (except as listed in HPI above).   Objective  Vitals:   05/22/21 0827  BP: 130/82  Pulse: 88  Resp: 16  Temp: 97.8 F (36.6 C)  SpO2: 98%  Weight: 191 lb (86.6 kg)  Height: '5\' 4"'  (1.626 m)    Body mass index is 32.79 kg/m.  Physical Exam  Constitutional: Patient appears well-developed and well-nourished. No distress.  HENT: Head: Normocephalic and atraumatic. Ears: B TMs ok, no erythema or effusion; Nose: Not done Mouth/Throat: not done  Eyes: Conjunctivae and EOM are normal. Pupils are equal, round, and reactive to light. No scleral icterus.  Neck: Normal range of motion. Neck supple. No JVD present. No thyromegaly present.  Cardiovascular: Normal rate, regular rhythm and normal heart sounds.  No murmur heard. No BLE edema. Pulmonary/Chest: Effort normal and breath sounds normal. No respiratory distress. Abdominal: Soft. Bowel sounds are normal, no distension. There is no tenderness. no masses Breast: no lumps or masses, no nipple discharge or rashes FEMALE GENITALIA:  External genitalia normal External urethra normal Vaginal vault normal without discharge or lesions Cervix normal without discharge or lesions Bimanual exam normal without masses RECTAL: not done  Musculoskeletal: Normal range of motion, no joint effusions. No gross deformities Neurological: he is alert and oriented to person, place, and time. No cranial nerve deficit. Coordination, balance, strength, speech and gait are normal.  Skin: Skin is warm and dry. No rash noted. No erythema. Atypical moles on left upper arm and right shoulder  Psychiatric: Patient has a normal mood and affect. behavior is normal. Judgment and thought content normal.   Recent Results (from the past 2160 hour(s))  POCT HgB A1C     Status: None   Collection Time: 04/10/21 10:48 AM  Result Value Ref Range   Hemoglobin A1C 5.6 4.0 - 5.6 %    HbA1c POC (<> result, manual entry)     HbA1c, POC (prediabetic range)     HbA1c, POC (controlled diabetic range)       Fall Risk: Fall Risk  05/22/2021 04/10/2021 01/02/2021 08/26/2020 05/25/2020  Falls in the past year? 0 0 0 0 0  Number falls in past yr: 0 0 0 0 0  Injury with Fall? 0 0 0 0 0  Risk for fall due to : No Fall Risks No Fall Risks - - -  Follow up Falls prevention discussed Falls prevention discussed - - -     Functional Status Survey: Is the patient deaf or have difficulty hearing?: No Does the patient have difficulty seeing, even when wearing glasses/contacts?: No Does the patient have difficulty concentrating, remembering, or making decisions?: No Does the patient have difficulty walking or climbing stairs?: No Does the patient have difficulty dressing or bathing?: No Does the patient have difficulty doing errands alone such as visiting a doctor's office or shopping?: No   Assessment & Plan  1. Well adult exam   2. Cervical cancer screening  -  Cytology - PAP    3. Breast cancer screening by mammogram  - MM 3D SCREEN BREAST BILATERAL; Future  4. Multiple atypical skin moles  - Ambulatory referral to Dermatology   -USPSTF grade A and B recommendations reviewed with patient; age-appropriate recommendations, preventive care, screening tests, etc discussed and encouraged; healthy living encouraged; see AVS for patient education given to patient -Discussed importance of 150 minutes of physical activity weekly, eat two servings of fish weekly, eat one serving of tree nuts ( cashews, pistachios, pecans, almonds.Marland Kitchen) every other day, eat 6 servings of fruit/vegetables daily and drink plenty of water and avoid sweet beverages.

## 2021-05-22 ENCOUNTER — Ambulatory Visit (INDEPENDENT_AMBULATORY_CARE_PROVIDER_SITE_OTHER): Payer: 59 | Admitting: Family Medicine

## 2021-05-22 ENCOUNTER — Other Ambulatory Visit (HOSPITAL_COMMUNITY)
Admission: RE | Admit: 2021-05-22 | Discharge: 2021-05-22 | Disposition: A | Discharge status: Home / Self Care | Payer: 59 | Source: Ambulatory Visit | Attending: Family Medicine | Admitting: Family Medicine

## 2021-05-22 ENCOUNTER — Other Ambulatory Visit: Payer: Self-pay

## 2021-05-22 ENCOUNTER — Encounter: Payer: Self-pay | Admitting: Family Medicine

## 2021-05-22 VITALS — BP 130/82 | HR 88 | Temp 97.8°F | Resp 16 | Ht 64.0 in | Wt 191.0 lb

## 2021-05-22 DIAGNOSIS — Z1231 Encounter for screening mammogram for malignant neoplasm of breast: Secondary | ICD-10-CM

## 2021-05-22 DIAGNOSIS — Z0000 Encounter for general adult medical examination without abnormal findings: Secondary | ICD-10-CM | POA: Diagnosis not present

## 2021-05-22 DIAGNOSIS — Z124 Encounter for screening for malignant neoplasm of cervix: Secondary | ICD-10-CM | POA: Diagnosis present

## 2021-05-22 DIAGNOSIS — D229 Melanocytic nevi, unspecified: Secondary | ICD-10-CM | POA: Diagnosis not present

## 2021-05-24 LAB — CYTOLOGY - PAP
Comment: NEGATIVE
Diagnosis: NEGATIVE
High risk HPV: NEGATIVE

## 2021-06-25 ENCOUNTER — Encounter: Payer: Self-pay | Admitting: Family Medicine

## 2021-07-10 ENCOUNTER — Other Ambulatory Visit: Payer: Self-pay | Admitting: Family Medicine

## 2021-07-10 DIAGNOSIS — E119 Type 2 diabetes mellitus without complications: Secondary | ICD-10-CM

## 2021-07-10 DIAGNOSIS — E782 Mixed hyperlipidemia: Secondary | ICD-10-CM

## 2021-07-11 NOTE — Progress Notes (Signed)
Name: Melanie Villarreal   MRN: [id number]    DOB: 03-Jan-1976   Date:07/12/2021       Progress Note  Subjective  Chief Complaint  Follow Up  HPI  DMII: she has associated hypertension, dyslipidemia, obese, she also has neuropahty. She has been on Ozempic since May 2020  She denies polyphagia, polydipsia or polyuria . A1C is 5.4 % today,  she tried going down on dose of Ozempic from 1 mg to 0.5 mg but gained weight and went back to 1 mg dose, no hypoglycemic episodes.  She is on Atorvastatin for dyslipidemia and denies side effects. She told me about a scar since childhood on right first toe [employer] door scrapped her foot) she states the area is numb, but only around the scar   Dyslipidemia: Last LDL was up from 52 to 92, triglycerides also higher at 168 and HDL stable at 36 . She is now taking a full dose of Atorvastatin daily , discussed rechecking labs today but she would like to recheck it first    HTN: bp is at goal today, she is back on Benicar 20 mg dose. She had stopped Metoprolol for a few months but she states she has been using Metoprolol prn now , she had an episode that lasted over one hour with heart rate around 130's back in June 2022. Denies chest pain or palpitation at this time    Morbid Obesity: she has been obese since age 80. She states worse since the last pregnancy. She was on a  glutten free diet, chooses low carb choices,  walking 4 days a week, going to the gym with her husband and weight is stable now. Started diet was May 1st  2020 her weight was 235 lbs and she was  down to 187 lbs, weight is now stable in the low 190's lbs for a long time now. Doing well on current regiment    ADHD: she states symptoms started in childhood, but was treated at age 65 , she has tried Adderal - caused anger outburst, felt tired at the end of the day, Strattera caused sedation, Ritalin caused palpitation. She has been on Vyvanse for the past 5 years and is working well for her. No side  effects of medication, except is helps a little on her appetite She states her boss notices when she does not take the medication. She states her husband and children also noticed right away when she skips the dose. She has been getting 90 day supply of 50 mg dose. 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 Her parents were going through a divorce, mother left and stayed with her step-father, she has one brother that died as an infant and another one that died from drug overdose at age 44 ( she was  67 ). There is a family history of depression. She was on Zoloft from age 13 until year 2019 when she was switched from Zoloft to Ovilla because she had a relapse. ( she also states while on zoloft tried some other SSRI - not sure of the name).  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 She stopped seeing psychiatrist because of turn over of providers. She stopped taking Viibrid on May 13 th 22 because she forgot to take medication on vacation, she has been taking some oils and  is doing well without  daily medication but still takes hydroxyzine prn , she likes a specific generic medication , she will contact me with the name of manufacture so I can send it to pharmacy    Migraine headaches: she states seldom has episodes, she states she has a tingling sensation on her scalp followed by a headache, it is described as a "zap" sensation . She has phonophobia and photophobia. She does not like taking Maxalt because of side effects, we gave her a rx Roselyn Meier but has not tired it yet   Thyroid nodule: large and palpable on right side. She has not called Endo yet   Atypical moles: she needs to contact Dermatologist, missed the phone call   Patient Active Problem List   Diagnosis Date Noted   Hypertension associated with type 2 diabetes mellitus ([place]) 12/09/2019   Diabetes mellitus type  2 in obese ([place]) 12/09/2019   Hyperlipidemia associated with type 2 diabetes mellitus ([place]) 07/02/2019   Urge incontinence 08/15/2018   Altered awareness, transient 06/17/2018   Headache disorder 06/17/2018   Family history of premature CAD 09/11/2017   Thyroid nodule 08/15/2017   LVH (left ventricular hypertrophy) 02/01/2017   Elevated serum glutamic pyruvic transaminase (SGPT) level 07/24/2016   Morbid obesity ([place]) 03/23/2016   Sinus tachycardia 02/24/2016   Valvular regurgitation 01/11/2016   ADHD (attention deficit hyperactivity disorder), combined type 08/30/2015   Generalized anxiety disorder 12/31/2014   Major depressive disorder, recurrent episode ([place]) 10/25/2014   Essential hypertension 10/25/2014    Past Surgical History:  Procedure Laterality Date   CESAREAN SECTION     ENDOMETRIAL ABLATION W/ NOVASURE  06/2008   estimate   thyroid nodule biopsy     TUBAL LIGATION      Family History  Problem Relation Age of Onset   Heart disease Mother    Hyperlipidemia Mother    Hypertension Mother    Miscarriages / [place] Mother        2   Anxiety disorder Mother    Coronary artery disease Mother 60       11 stents   Alcohol abuse Father    Cancer Father        testicular   Depression Father    Heart disease Sister    Hyperlipidemia Sister    Hypertension Sister    Other Sister         Langerhans Cell Histiocytosis   Drug abuse Brother    Alcohol abuse Brother    Depression Brother    ADD / ADHD Son    Mental illness Maternal Aunt    Alcohol abuse Maternal Grandfather    Depression Maternal Grandfather    Anxiety disorder Maternal Grandfather    Cancer Paternal Grandmother    Breast cancer Paternal Grandmother        late 70's   Alcohol abuse Paternal Grandfather     Social History   Tobacco Use   Smoking status: Former    Packs/day: 0.50    Types: Cigarettes, E-cigarettes    Start date: 09/14/1995    Quit date: 01/10/2021    Years since quitting:  0.5   Smokeless tobacco: Never   Tobacco comments:    On an off over the years. She smoked for 12 years solid, otherwise on and off   Substance Use Topics   Alcohol use: Yes    Alcohol/week: 0.0 standard drinks    Comment: rare     Current Outpatient Medications:  aspirin EC 81 MG tablet, Take 1 tablet (81 mg total) by mouth daily., Disp: , Rfl:    atorvastatin (LIPITOR) 20 MG tablet, TAKE ONE TABLET BY MOUTH ONE TIME DAILY, Disp: 30 tablet, Rfl: 0   Homeopathic Products (FRANKINCENSE UPLIFTING) OIL, Inhale 5 drops into the lungs daily., Disp: , Rfl:    hydrOXYzine (ATARAX/VISTARIL) 50 MG tablet, TAKE ONE TABLET BY MOUTH AT BEDTIME, Disp: 90 tablet, Rfl: 0   lisdexamfetamine (VYVANSE) 50 MG capsule, Take 1 capsule (50 mg total) by mouth daily., Disp: 90 capsule, Rfl: 0   olmesartan (BENICAR) 20 MG tablet, TAKE 1/2 TO 1 TABLET BY MOUTH DAILY, Disp: 90 tablet, Rfl: 1   rizatriptan (MAXALT-MLT) 10 MG disintegrating tablet, Take 1 tablet (10 mg total) by mouth as needed for migraine. May repeat in 2 hours if needed, Disp: 10 tablet, Rfl: 0   Semaglutide, 1 MG/DOSE, 4 MG/3ML SOPN, Inject 1 mg as directed once a week., Disp: 9 mL, Rfl: 1   Ubrogepant (UBRELVY) 100 MG TABS, Take 1 tablet by mouth daily as needed., Disp: 9 tablet, Rfl: 1  Allergies  Allergen Reactions   Penicillins Anaphylaxis   Latex Hives and Rash    I personally reviewed active problem list, medication list, allergies, family history, social history, health maintenance with the patient/caregiver today.   ROS  Constitutional: Negative for fever, positive for mild  weight change.  Respiratory: Negative for cough and shortness of breath.   Cardiovascular: Negative for chest pain or palpitations.  Gastrointestinal: Negative for abdominal pain, no bowel changes.  Musculoskeletal: Negative for gait problem or joint swelling.  Skin: Negative for rash.  Neurological: Negative for dizziness or headache.  No other specific  complaints in a complete review of systems (except as listed in HPI above).   Objective  Vitals:   07/12/21 0901  BP: 132/70  Pulse: 95  Resp: 16  Temp: 98.2 F (36.8 C)  SpO2: 97%  Weight: 187 lb (84.8 kg)  Height: 5\' 4"  (1.626 m)    Body mass index is 32.1 kg/m.  Physical Exam  Constitutional: Patient appears well-developed and well-nourished. Obese  No distress.  HEENT: head atraumatic, normocephalic, pupils equal and reactive to light, neck supple Cardiovascular: Normal rate, regular rhythm and normal heart sounds.  No murmur heard. No BLE edema. Pulmonary/Chest: Effort normal and breath sounds normal. No respiratory distress. Abdominal: Soft.  There is no tenderness. Psychiatric: Patient has a normal mood and affect. behavior is normal. Judgment and thought content normal.   Recent Results (from the past 2160 hour(s))  Cytology - PAP     Status: None   Collection Time: 05/22/21  9:12 AM  Result Value Ref Range   High risk HPV Negative    Adequacy      Satisfactory for evaluation; transformation zone component PRESENT.   Diagnosis      - Negative for intraepithelial lesion or malignancy (NILM)   Comment Normal Reference Range HPV - Negative      PHQ2/9: Depression screen [facility] 2/9 07/12/2021 05/22/2021 04/10/2021 01/02/2021 08/26/2020  Decreased Interest 0 0 0 0 0  Down, Depressed, Hopeless 0 0 0 0 0  PHQ - 2 Score 0 0 0 0 0  Altered sleeping 0 0 0 0 1  Tired, decreased energy 0 0 0 0 0  Change in appetite 0 0 0 0 0  Feeling bad or failure about yourself  0 0 0 0 0  Trouble concentrating 0 0 0 0 0  Moving  slowly or fidgety/restless 0 0 0 0 0  Suicidal thoughts 0 0 0 0 0  PHQ-9 Score 0 0 0 0 1  Difficult doing work/chores - - - - -  Some recent data might be hidden    phq 9 is negative   Fall Risk: Fall Risk  07/12/2021 05/22/2021 04/10/2021 01/02/2021 08/26/2020  Falls in the past year? 0 0 0 0 0  Number falls in past yr: 0 0 0 0 0  Injury with Fall? 0 0 0 0  0  Risk for fall due to : No Fall Risks No Fall Risks No Fall Risks - -  Follow up Falls prevention discussed Falls prevention discussed Falls prevention discussed - -      Functional Status Survey: Is the patient deaf or have difficulty hearing?: No Does the patient have difficulty seeing, even when wearing glasses/contacts?: No Does the patient have difficulty concentrating, remembering, or making decisions?: No Does the patient have difficulty walking or climbing stairs?: No Does the patient have difficulty dressing or bathing?: No Does the patient have difficulty doing errands alone such as visiting a doctor's office or shopping?: No    Assessment & Plan  1. Hyperlipidemia associated with type 2 diabetes mellitus ([place])  On statin therapy   2. Major depression in remission [facility])  Doing well   3. Hypertension associated with diabetes ([place])  At goal   4. Essential hypertension  - olmesartan (BENICAR) 20 MG tablet; Take 0.5-1 tablets (10-20 mg total) by mouth daily.  Dispense: 90 tablet; Refill: 1  5. Diabetes mellitus type 2 in obese (HCC)  - POCT HgB A1C  6. ADHD (attention deficit hyperactivity disorder), combined type  - lisdexamfetamine (VYVANSE) 50 MG capsule; Take 1 capsule (50 mg total) by mouth daily.  Dispense: 90 capsule; Refill: 0  7. Migraine with aura and without status migrainosus, not intractable   8. Obesity (BMI 30.0-34.9)  Discussed with the patient the risk posed by an increased BMI. Discussed importance of portion control, calorie counting and at least 150 minutes of physical activity weekly. Avoid sweet beverages and drink more water. Eat at least 6 servings of fruit and vegetables daily    9. Controlled type 2 diabetes mellitus without complication, without long-term current use of insulin (HCC)  - atorvastatin (LIPITOR) 20 MG tablet; Take 1 tablet (20 mg total) by mouth daily.  Dispense: 90 tablet; Refill: 1  10. Mixed hyperlipidemia  -  atorvastatin (LIPITOR) 20 MG tablet; Take 1 tablet (20 mg total) by mouth daily.  Dispense: 90 tablet; Refill: 1

## 2021-07-12 ENCOUNTER — Encounter: Payer: Self-pay | Admitting: Family Medicine

## 2021-07-12 ENCOUNTER — Ambulatory Visit (INDEPENDENT_AMBULATORY_CARE_PROVIDER_SITE_OTHER): Payer: 59 | Admitting: Family Medicine

## 2021-07-12 VITALS — BP 132/70 | HR 95 | Temp 98.2°F | Resp 16 | Ht 64.0 in | Wt 187.0 lb

## 2021-07-12 DIAGNOSIS — E782 Mixed hyperlipidemia: Secondary | ICD-10-CM

## 2021-07-12 DIAGNOSIS — E669 Obesity, unspecified: Secondary | ICD-10-CM

## 2021-07-12 DIAGNOSIS — F902 Attention-deficit hyperactivity disorder, combined type: Secondary | ICD-10-CM

## 2021-07-12 DIAGNOSIS — E1159 Type 2 diabetes mellitus with other circulatory complications: Secondary | ICD-10-CM | POA: Diagnosis not present

## 2021-07-12 DIAGNOSIS — E1169 Type 2 diabetes mellitus with other specified complication: Secondary | ICD-10-CM | POA: Diagnosis not present

## 2021-07-12 DIAGNOSIS — F325 Major depressive disorder, single episode, in full remission: Secondary | ICD-10-CM

## 2021-07-12 DIAGNOSIS — I10 Essential (primary) hypertension: Secondary | ICD-10-CM

## 2021-07-12 DIAGNOSIS — E119 Type 2 diabetes mellitus without complications: Secondary | ICD-10-CM

## 2021-07-12 DIAGNOSIS — I152 Hypertension secondary to endocrine disorders: Secondary | ICD-10-CM

## 2021-07-12 DIAGNOSIS — G43109 Migraine with aura, not intractable, without status migrainosus: Secondary | ICD-10-CM

## 2021-07-12 DIAGNOSIS — E785 Hyperlipidemia, unspecified: Secondary | ICD-10-CM

## 2021-07-12 LAB — POCT GLYCOSYLATED HEMOGLOBIN (HGB A1C): Hemoglobin A1C: 5.4 % (ref 4.0–5.6)

## 2021-07-12 MED ORDER — OLMESARTAN MEDOXOMIL 20 MG PO TABS: 10.0000 mg | ORAL_TABLET | Freq: Every day | ORAL | 1 refills | Status: DC

## 2021-07-12 MED ORDER — LISDEXAMFETAMINE DIMESYLATE 50 MG PO CAPS
50.0000 mg | ORAL_CAPSULE | Freq: Every day | ORAL | 0 refills | Status: DC
Start: 2021-07-12 — End: 2021-10-12

## 2021-07-12 MED ORDER — ATORVASTATIN CALCIUM 20 MG PO TABS: 20.0000 mg | ORAL_TABLET | Freq: Every day | ORAL | 1 refills | Status: DC

## 2021-07-13 ENCOUNTER — Encounter: Payer: Self-pay | Admitting: Family Medicine

## 2021-07-15 ENCOUNTER — Telehealth: Payer: Self-pay | Admitting: Family Medicine

## 2021-07-15 DIAGNOSIS — I517 Cardiomegaly: Secondary | ICD-10-CM

## 2021-07-15 DIAGNOSIS — I10 Essential (primary) hypertension: Secondary | ICD-10-CM

## 2021-07-15 NOTE — Telephone Encounter (Signed)
med dc'd 01/02/21 Dr. Sowles/// "pt discharge"  Called [facility] and spoke to [employer] and med taken off med profile   Requested Prescriptions  Refused Prescriptions Disp Refills   metoprolol succinate (TOPROL-XL) 25 MG 24 hr tablet [Pharmacy Med Name: METOPROLOL SUCC ER 25 MG TAB[*]] 90 tablet 0    Sig: TAKE ONE TABLET BY MOUTH ONE TIME DAILY     Cardiovascular:  Beta Blockers Passed - 07/15/2021  2:15 PM      Passed - Last BP in normal range    BP Readings from Last 1 Encounters:  07/12/21 132/70         Passed - Last Heart Rate in normal range    Pulse Readings from Last 1 Encounters:  07/12/21 95         Passed - Valid encounter within last 6 months    Recent Outpatient Visits          3 days ago Hyperlipidemia associated with type 2 diabetes mellitus [facility])   [facility] Steele Sizer, MD   1 month ago Well adult exam   [facility] [place], Drue Stager, MD   3 months ago Diabetes mellitus type 2 in obese [facility])   [facility] Steele Sizer, MD   6 months ago Diabetes mellitus type 2 in obese [facility])   [facility] Steele Sizer, MD   10 months ago Diabetes mellitus type 2 in obese [facility])   [facility] Steele Sizer, MD      Future Appointments            In 2 months Ancil Boozer, Drue Stager, MD [facility], [facility]

## 2021-07-24 ENCOUNTER — Other Ambulatory Visit: Payer: Self-pay | Admitting: Family Medicine

## 2021-07-24 MED ORDER — METOPROLOL SUCCINATE ER 25 MG PO TB24: 25.0000 mg | ORAL_TABLET | Freq: Every day | ORAL | 1 refills | Status: DC

## 2021-07-30 ENCOUNTER — Other Ambulatory Visit: Payer: Self-pay | Admitting: Family Medicine

## 2021-07-30 DIAGNOSIS — F325 Major depressive disorder, single episode, in full remission: Secondary | ICD-10-CM

## 2021-07-30 DIAGNOSIS — F419 Anxiety disorder, unspecified: Secondary | ICD-10-CM

## 2021-07-30 NOTE — Telephone Encounter (Signed)
Requested Prescriptions  Pending Prescriptions Disp Refills   hydrOXYzine (ATARAX) 50 MG tablet [Pharmacy Med Name: HYDROXYZINE HCL 50 MG TAB] 90 tablet 0    Sig: TAKE ONE TABLET BY MOUTH AT BEDTIME     Ear, Nose, and Throat:  Antihistamines Passed - 07/30/2021  1:59 AM      Passed - Valid encounter within last 12 months    Recent Outpatient Visits          2 weeks ago Hyperlipidemia associated with type 2 diabetes mellitus [facility])   [facility] Steele Sizer, MD   2 months ago Well adult exam   [facility], Drue Stager, MD   3 months ago Diabetes mellitus type 2 in obese [facility])   [facility] Steele Sizer, MD   6 months ago Diabetes mellitus type 2 in obese [facility])   [facility] Steele Sizer, MD   11 months ago Diabetes mellitus type 2 in obese [facility])   [facility] Steele Sizer, MD      Future Appointments            In 2 months Ancil Boozer, Drue Stager, MD [facility], [facility]

## 2021-08-31 ENCOUNTER — Other Ambulatory Visit: Payer: Self-pay | Admitting: Family Medicine

## 2021-08-31 DIAGNOSIS — E1169 Type 2 diabetes mellitus with other specified complication: Secondary | ICD-10-CM

## 2021-09-01 NOTE — Telephone Encounter (Signed)
Requested Prescriptions  Pending Prescriptions Disp Refills   OZEMPIC, 1 MG/DOSE, 4 MG/3ML SOPN [Pharmacy Med Name: OZEMPIC 1 MG DOSE PEN[R]] 9 mL 0    Sig: INJECT 1MG  UNDER THE SKIN EVERY WEEK     Endocrinology:  Diabetes - GLP-1 Receptor Agonists - semaglutide Passed - 08/31/2021  7:37 PM      Passed - HBA1C in normal range and within 180 days    Hemoglobin A1C  Date Value Ref Range Status  07/12/2021 5.4 4.0 - 5.6 % Final   HbA1c, POC (controlled diabetic range)  Date Value Ref Range Status  08/15/2018 6.6 0.0 - 7.0 % Final   Hgb A1c MFr Bld  Date Value Ref Range Status  03/31/2019 6.8 (H) <5.7 % of total Hgb Final    Comment:    For someone without known diabetes, a hemoglobin A1c value of 6.5% or greater indicates that they may have  diabetes and this should be confirmed with a follow-up  test. . For someone with known diabetes, a value <7% indicates  that their diabetes is well controlled and a value  greater than or equal to 7% indicates suboptimal  control. A1c targets should be individualized based on  duration of diabetes, age, comorbid conditions, and  other considerations. . Currently, no consensus exists regarding use of hemoglobin A1c for diagnosis of diabetes for children. .          Passed - Cr in normal range and within 360 days    Creat  Date Value Ref Range Status  01/02/2021 0.72 0.50 - 1.10 mg/dL Final   Creatinine, Urine  Date Value Ref Range Status  01/02/2021 222 20 - 275 mg/dL Final         Passed - Valid encounter within last 6 months    Recent Outpatient Visits          1 month ago Hyperlipidemia associated with type 2 diabetes mellitus [facility])   [facility] Steele Sizer, MD   3 months ago Well adult exam   [facility] [place], Drue Stager, MD   4 months ago Diabetes mellitus type 2 in obese [facility])   [facility] Steele Sizer, MD   8 months ago Diabetes mellitus type 2 in obese  [facility])   [facility] Steele Sizer, MD   1 year ago Diabetes mellitus type 2 in obese [facility])   [facility] Steele Sizer, MD      Future Appointments            In 1 month Ancil Boozer, Drue Stager, MD [facility], [facility]

## 2021-09-04 NOTE — Progress Notes (Signed)
Name: Melanie Villarreal   MRN: [id number]    DOB: 1975/09/03   Date:09/05/2021       Progress Note  Subjective  Chief Complaint  Referral  HPI   Family history of heart disease: about 10 days ago she was sitting down at a restaurant, felt nauseated, diaphoretic and chest fullness . No fatigue or SOB. She drank water and symptoms resolved. This past weekend felt clammy and tachycardic - she thinks it was secondary to vaping and drinking caffeine tea. She is states she is cutting down on vaping. She feels anxious when she tries to skip vaping  Nicotine addiction: she has been vaping nicotine since June 28 th 2022 when she quit smoking cigarettes. She is feeling guilty about it , she feels like she has been using more nicotine since she switched to vape. She would like to quit nicotine all together.    Patient Active Problem List   Diagnosis Date Noted   Hypertension associated with type 2 diabetes mellitus ([place]) 12/09/2019   Diabetes mellitus type 2 in obese ([place]) 12/09/2019   Hyperlipidemia associated with type 2 diabetes mellitus ([place]) 07/02/2019   Urge incontinence 08/15/2018   Altered awareness, transient 06/17/2018   Headache disorder 06/17/2018   Family history of premature CAD 09/11/2017   Thyroid nodule 08/15/2017   LVH (left ventricular hypertrophy) 02/01/2017   Elevated serum glutamic pyruvic transaminase (SGPT) level 07/24/2016   Morbid obesity ([place]) 03/23/2016   Sinus tachycardia 02/24/2016   Valvular regurgitation 01/11/2016   ADHD (attention deficit hyperactivity disorder), combined type 08/30/2015   Generalized anxiety disorder 12/31/2014   Major depressive disorder, recurrent episode ([place]) 10/25/2014   Essential hypertension 10/25/2014    Past Surgical History:  Procedure Laterality Date   CESAREAN SECTION     ENDOMETRIAL ABLATION W/ NOVASURE  06/2008   estimate   thyroid nodule biopsy     TUBAL LIGATION      Family History  Problem Relation Age of Onset    Heart disease Mother    Hyperlipidemia Mother    Hypertension Mother    Miscarriages / [place] Mother        2   Anxiety disorder Mother    Coronary artery disease Mother 65       11 stents   Alcohol abuse Father    Cancer Father        testicular   Depression Father    Heart disease Sister    Hyperlipidemia Sister    Hypertension Sister    Other Sister         Langerhans Cell Histiocytosis   Drug abuse Brother    Alcohol abuse Brother    Depression Brother    ADD / ADHD Son    Mental illness Maternal Aunt    Alcohol abuse Maternal Grandfather    Depression Maternal Grandfather    Anxiety disorder Maternal Grandfather    Cancer Paternal Grandmother    Breast cancer Paternal Grandmother        late 70's   Alcohol abuse Paternal Grandfather     Social History   Tobacco Use   Smoking status: Former    Packs/day: 0.50    Types: Cigarettes, E-cigarettes    Start date: 09/14/1995    Quit date: 01/10/2021    Years since quitting: 0.6   Smokeless tobacco: Never   Tobacco comments:    On an off over the years. She smoked for 12 years solid, otherwise on and off   Substance Use  Topics   Alcohol use: Yes    Alcohol/week: 0.0 standard drinks    Comment: rare     Current Outpatient Medications:    aspirin EC 81 MG tablet, Take 1 tablet (81 mg total) by mouth daily., Disp: , Rfl:    atorvastatin (LIPITOR) 20 MG tablet, Take 1 tablet (20 mg total) by mouth daily., Disp: 90 tablet, Rfl: 1   Homeopathic Products (FRANKINCENSE UPLIFTING) OIL, Inhale 5 drops into the lungs daily., Disp: , Rfl:    hydrOXYzine (ATARAX) 50 MG tablet, TAKE ONE TABLET BY MOUTH AT BEDTIME, Disp: 90 tablet, Rfl: 0   lisdexamfetamine (VYVANSE) 50 MG capsule, Take 1 capsule (50 mg total) by mouth daily., Disp: 90 capsule, Rfl: 0   metoprolol succinate (TOPROL-XL) 25 MG 24 hr tablet, Take 1 tablet (25 mg total) by mouth daily., Disp: 90 tablet, Rfl: 1   olmesartan (BENICAR) 20 MG tablet, Take 0.5-1  tablets (10-20 mg total) by mouth daily., Disp: 90 tablet, Rfl: 1   OZEMPIC, 1 MG/DOSE, 4 MG/3ML SOPN, INJECT 1MG  UNDER THE SKIN EVERY WEEK, Disp: 9 mL, Rfl: 0   rizatriptan (MAXALT-MLT) 10 MG disintegrating tablet, Take 1 tablet (10 mg total) by mouth as needed for migraine. May repeat in 2 hours if needed, Disp: 10 tablet, Rfl: 0   Ubrogepant (UBRELVY) 100 MG TABS, Take 1 tablet by mouth daily as needed., Disp: 9 tablet, Rfl: 1  Allergies  Allergen Reactions   Penicillins Anaphylaxis   Latex Hives and Rash    I personally reviewed active problem list, medication list, allergies, family history, social history, health maintenance with the patient/caregiver today.   ROS  Ten systems reviewed and is negative except as mentioned in HPI   Objective  Vitals:   09/05/21 0828  BP: 128/70  Pulse: 88  Resp: 16  Temp: 97.9 F (36.6 C)  TempSrc: Oral  SpO2: 99%  Weight: 182 lb 11.2 oz (82.9 kg)  Height: 5\' 4"  (1.626 m)    Body mass index is 31.36 kg/m.  Physical Exam  Constitutional: Patient appears well-developed and well-nourished. Obese No distress.  HEENT: head atraumatic, normocephalic, pupils equal and reactive to light, neck supple Cardiovascular: Normal rate, regular rhythm and normal heart sounds.  No murmur heard. No BLE edema. Pulmonary/Chest: Effort normal and breath sounds normal. No respiratory distress. Abdominal: Soft.  There is no tenderness. Psychiatric: Patient has a normal mood and affect. behavior is normal. Judgment and thought content normal.   Recent Results (from the past 2160 hour(s))  POCT HgB A1C     Status: None   Collection Time: 07/12/21  9:11 AM  Result Value Ref Range   Hemoglobin A1C 5.4 4.0 - 5.6 %   HbA1c POC (<> result, manual entry)     HbA1c, POC (prediabetic range)     HbA1c, POC (controlled diabetic range)      PHQ2/9: Depression screen [facility] 2/9 09/05/2021 07/12/2021 05/22/2021 04/10/2021 01/02/2021  Decreased Interest 0 0 0 0 0   Down, Depressed, Hopeless 0 0 0 0 0  PHQ - 2 Score 0 0 0 0 0  Altered sleeping 0 0 0 0 0  Tired, decreased energy 0 0 0 0 0  Change in appetite 0 0 0 0 0  Feeling bad or failure about yourself  0 0 0 0 0  Trouble concentrating 0 0 0 0 0  Moving slowly or fidgety/restless 0 0 0 0 0  Suicidal thoughts 0 0 0 0 0  PHQ-9 Score  0 0 0 0 0  Difficult doing work/chores Not difficult at all - - - -  Some recent data might be hidden    phq 9 is negative   Fall Risk: Fall Risk  09/05/2021 07/12/2021 05/22/2021 04/10/2021 01/02/2021  Falls in the past year? 0 0 0 0 0  Number falls in past yr: 0 0 0 0 0  Injury with Fall? 0 0 0 0 0  Risk for fall due to : No Fall Risks No Fall Risks No Fall Risks No Fall Risks -  Follow up Falls prevention discussed Falls prevention discussed Falls prevention discussed Falls prevention discussed -      Functional Status Survey: Is the patient deaf or have difficulty hearing?: No Does the patient have difficulty seeing, even when wearing glasses/contacts?: No Does the patient have difficulty concentrating, remembering, or making decisions?: No Does the patient have difficulty walking or climbing stairs?: No Does the patient have difficulty dressing or bathing?: No Does the patient have difficulty doing errands alone such as visiting a doctor's office or shopping?: No    Assessment & Plan  1. Chest discomfort  - Ambulatory referral to Cardiology  2. Anxiety   3. Palpitation  - Ambulatory referral to Cardiology  4. Family history of heart disease  - Ambulatory referral to Cardiology  5. Nicotine dependence, uncomplicated, unspecified nicotine product type  She does not want medication, she is weaning self off slowly

## 2021-09-05 ENCOUNTER — Ambulatory Visit: Payer: PRIVATE HEALTH INSURANCE | Admitting: Family Medicine

## 2021-09-05 ENCOUNTER — Encounter: Payer: Self-pay | Admitting: Family Medicine

## 2021-09-05 VITALS — BP 128/70 | HR 88 | Temp 97.9°F | Resp 16 | Ht 64.0 in | Wt 182.7 lb

## 2021-09-05 DIAGNOSIS — F17200 Nicotine dependence, unspecified, uncomplicated: Secondary | ICD-10-CM

## 2021-09-05 DIAGNOSIS — R002 Palpitations: Secondary | ICD-10-CM | POA: Diagnosis not present

## 2021-09-05 DIAGNOSIS — Z8249 Family history of ischemic heart disease and other diseases of the circulatory system: Secondary | ICD-10-CM | POA: Diagnosis not present

## 2021-09-05 DIAGNOSIS — R0789 Other chest pain: Secondary | ICD-10-CM

## 2021-09-05 DIAGNOSIS — F419 Anxiety disorder, unspecified: Secondary | ICD-10-CM

## 2021-10-05 ENCOUNTER — Encounter: Payer: Self-pay | Admitting: Cardiology

## 2021-10-05 ENCOUNTER — Ambulatory Visit (INDEPENDENT_AMBULATORY_CARE_PROVIDER_SITE_OTHER): Payer: 59

## 2021-10-05 ENCOUNTER — Ambulatory Visit: Payer: 59 | Admitting: Cardiology

## 2021-10-05 ENCOUNTER — Other Ambulatory Visit: Payer: Self-pay

## 2021-10-05 VITALS — BP 96/60 | HR 89 | Ht 64.0 in | Wt 191.4 lb

## 2021-10-05 DIAGNOSIS — I10 Essential (primary) hypertension: Secondary | ICD-10-CM | POA: Diagnosis not present

## 2021-10-05 DIAGNOSIS — R002 Palpitations: Secondary | ICD-10-CM

## 2021-10-05 DIAGNOSIS — E1169 Type 2 diabetes mellitus with other specified complication: Secondary | ICD-10-CM | POA: Diagnosis not present

## 2021-10-05 DIAGNOSIS — E669 Obesity, unspecified: Secondary | ICD-10-CM

## 2021-10-05 MED ORDER — PROPRANOLOL HCL 20 MG PO TABS: 20.0000 mg | ORAL_TABLET | Freq: Three times a day (TID) | ORAL | 3 refills | Status: DC

## 2021-10-05 NOTE — Progress Notes (Unsigned)
Enrolled for [employer] to mail a ZIO XT long term holter monitor to the patients address on file.  

## 2021-10-05 NOTE — Patient Instructions (Addendum)
Medication Instructions:  ?Your physician has recommended you make the following change in your medication:  ?STOP: Metoprolol ?START: Propranolol 20 mg every 8 hours ?*If you need a refill on your cardiac medications before your next appointment, please call your pharmacy* ? ? ?Lab Work: ?None ?If you have labs (blood work) drawn today and your tests are completely normal, you will receive your results only by: ?MyChart Message (if you have MyChart) OR ?A paper copy in the mail ?If you have any lab test that is abnormal or we need to change your treatment, we will call you to review the results. ? ? ?Testing/Procedures: ?ZIO XT- Long Term Monitor Instructions ? ?Your physician has requested you wear a ZIO patch monitor for 14 days.  ?This is a single patch monitor. [employer] supplies one patch monitor per enrollment. Additional ?stickers are not available. Please do not apply patch if you will be having a Nuclear Stress Test,  ?Echocardiogram, Cardiac CT, MRI, or Chest Xray during the period you would be wearing the  ?monitor. The patch cannot be worn during these tests. You cannot remove and re-apply the  ?ZIO XT patch monitor.  ?Your ZIO patch monitor will be mailed 3 day USPS to your address on file. It may take 3-5 days  ?to receive your monitor after you have been enrolled.  ?Once you have received your monitor, please review the enclosed instructions. Your monitor  ?has already been registered assigning a specific monitor serial # to you. ? ?Billing and Patient Assistance Program Information ? ?We have supplied [employer] with any of your insurance information on file for billing purposes. ?[employer] offers a sliding scale Patient Assistance Program for patients that do not have  ?insurance, or whose insurance does not completely cover the cost of the ZIO monitor.  ?You must apply for the Patient Assistance Program to qualify for this discounted rate.  ?To apply, please call [employer] at [phone], select option 4,  select option 2, ask to apply for  ?Patient Assistance Program. Theodore Demark will ask your household income, and how many people  ?are in your household. They will quote your out-of-pocket cost based on that information.  ?[employer] will also be able to set up a 23-month interest-free payment plan if needed. ? ?Applying the monitor ?  ?Shave hair from upper left chest.  ?Hold abrader disc by orange tab. Rub abrader in 40 strokes over the upper left chest as  ?indicated in your monitor instructions.  ?Clean area with 4 enclosed alcohol pads. Let dry.  ?Apply patch as indicated in monitor instructions. Patch will be placed under collarbone on left  ?side of chest with arrow pointing upward.  ?Rub patch adhesive wings for 2 minutes. Remove white label marked "1". Remove the white  ?label marked "2". Rub patch adhesive wings for 2 additional minutes.  ?While looking in a mirror, press and release button in center of patch. A small green light will  ?flash 3-4 times. This will be your only indicator that the monitor has been turned on.  ?Do not shower for the first 24 hours. You may shower after the first 24 hours.  ?Press the button if you feel a symptom. You will hear a small click. Record Date, Time and  ?Symptom in the Patient Logbook.  ?When you are ready to remove the patch, follow instructions on the last 2 pages of Patient  ?Logbook. Stick patch monitor onto the last page of Patient Logbook.  ?Place Patient Logbook in the blue  and white box. Use locking tab on box and tape box closed  ?securely. The blue and white box has prepaid postage on it. Please place it in the mailbox as  ?soon as possible. Your physician should have your test results approximately 7 days after the  ?monitor has been mailed back to [facility]  ?Call [facility] at [phone] if you have questions regarding  ?your ZIO XT patch monitor. Call them immediately if you see an orange light blinking on your  ?monitor.  ?If your  monitor falls off in less than 4 days, contact our Monitor department at [phone].  ?If your monitor becomes loose or falls off after 4 days call [employer] at [phone] for  ?suggestions on securing your monitor ? ? ? ?Follow-Up: ?At [facility], you and your health needs are our priority.  As part of our continuing mission to provide you with exceptional heart care, we have created designated Provider Care Teams.  These Care Teams include your primary Cardiologist (physician) and Advanced Practice Providers (APPs -  Physician Assistants and Nurse Practitioners) who all work together to provide you with the care you need, when you need it. ? ?We recommend signing up for the patient portal called "MyChart".  Sign up information is provided on this After Visit Summary.  MyChart is used to connect with patients for Virtual Visits (Telemedicine).  Patients are able to view lab/test results, encounter notes, upcoming appointments, etc.  Non-urgent messages can be sent to your provider as well.   ?To learn more about what you can do with MyChart, go to [email or web address].   ? ?Your next appointment:   ?12 week(s) ? ?The format for your next appointment:   ?In Person ? ?Provider:   ?Berniece Salines, DO   ? ? ?Other Instructions ?  ?

## 2021-10-05 NOTE — Progress Notes (Signed)
?Cardiology Office Note:   ? ?Date:  10/05/2021  ? ?ID:  Melanie Villarreal, DOB March 13, 1976, MRN [id number] ? ?PCP:  Steele Sizer, MD  ?Cardiologist:  Berniece Salines, DO  ?Electrophysiologist:  None  ? ?Referring MD: Steele Sizer, MD  ? ?" I am having experiences when the heart rate goes up in a few nauseous and vomit" ? ?History of Present Illness:   ? ?Melanie Villarreal is a 46 y.o. female with a hx of hypertension, hyperlipidemia, diabetes type 2, obesity here today for follow-up visit.  The patient was seen in our practice back in 2019 at that time she had extensive work-up which included ZIO monitor, coronary CT scan which did not show any evidence of coronary artery disease as well as an echocardiogram which showed evidence of grade 1 diastolic dysfunction. ? ?Since that time she has not follow-up and she tells me she has been doing well.  But recently she tells me that she has been experiencing intermittent heart rates going up.  She says starting back to January 02, 2021 that day she was standing up she felt her heart rate go up to the 140s she then fell as if she was going to pass out.  Thankfully that day she did not pass out.  But from that episode she decided to quit smoking and has started vaping.  Then she has had small episodes.  She notes that 4 weeks Saturday she was at a restaurant where she was sitting with her husband she felt flushed she felt as if she was going to pass out, she was sitting and did not move however she notes that she vomited and then felt better. ? ?No other complaints at this time.  She has not had any chest pain or shortness of breath.  Her biggest issue is the fact that her heart rate goes up and she feels as if she is going to vomit. ? ?Past Medical History:  ?Diagnosis Date  ? ADHD (attention deficit hyperactivity disorder), combined type   ? Allergy   ? Anxiety   ? Depression, controlled   ? Diabetes mellitus without complication ([place])   ? Generalized anxiety disorder 12/31/2014   ? HTN, goal below 140/90   ? Hyperlipidemia   ? Hypertension goal BP (blood pressure) < 130/80 10/25/2014  ? LVH (left ventricular hypertrophy) 02/01/2017  ? No CHF, on echo 2016, Dr. Clayborn Bigness  ? Morbid obesity ([place])   ? Panic disorder with agoraphobia and moderate panic attacks 12/30/2015  ? Thyroid nodule 08/15/2017  ? ? ?Past Surgical History:  ?Procedure Laterality Date  ? CESAREAN SECTION    ? ENDOMETRIAL ABLATION W/ NOVASURE  06/2008  ? estimate  ? thyroid nodule biopsy    ? TUBAL LIGATION    ? ? ?Current Medications: ?Current Meds  ?Medication Sig  ? atorvastatin (LIPITOR) 20 MG tablet Take 1 tablet (20 mg total) by mouth daily.  ? Homeopathic Products (FRANKINCENSE UPLIFTING) OIL Inhale 5 drops into the lungs daily.  ? hydrOXYzine (ATARAX) 50 MG tablet TAKE ONE TABLET BY MOUTH AT BEDTIME  ? lisdexamfetamine (VYVANSE) 50 MG capsule Take 1 capsule (50 mg total) by mouth daily.  ? olmesartan (BENICAR) 20 MG tablet Take 0.5-1 tablets (10-20 mg total) by mouth daily.  ? OZEMPIC, 1 MG/DOSE, 4 MG/3ML SOPN INJECT '1MG'$  UNDER THE SKIN EVERY WEEK  ? propranolol (INDERAL) 20 MG tablet Take 1 tablet (20 mg total) by mouth every 8 (eight) hours.  ? rizatriptan (MAXALT-MLT) 10  MG disintegrating tablet Take 1 tablet (10 mg total) by mouth as needed for migraine. May repeat in 2 hours if needed  ? Ubrogepant (UBRELVY) 100 MG TABS Take 1 tablet by mouth daily as needed.  ? [DISCONTINUED] metoprolol succinate (TOPROL-XL) 25 MG 24 hr tablet Take 1 tablet (25 mg total) by mouth daily.  ?  ? ?Allergies:   Penicillins and Latex  ? ?Social History  ? ?Socioeconomic History  ? Marital status: Married  ?  Spouse name: Erlene Quan  ? Number of children: 3  ? Years of education: 2 Masters  ? Highest education level: Master's degree (e.g., MA, MS, MEng, MEd, MSW, MBA)  ?Occupational History  ? Not on file  ?Tobacco Use  ? Smoking status: Former  ?  Packs/day: 0.50  ?  Types: Cigarettes, E-cigarettes  ?  Start date: 09/14/1995  ?  Quit date:  01/10/2021  ?  Years since quitting: 0.7  ? Smokeless tobacco: Never  ? Tobacco comments:  ?  On an off over the years. She smoked for 12 years solid, otherwise on and off   ?Vaping Use  ? Vaping Use: Former  ? Start date: 06/22/2017  ? Quit date: 11/13/2017  ?Substance and Sexual Activity  ? Alcohol use: Yes  ?  Alcohol/week: 0.0 standard drinks  ?  Comment: rare  ? Drug use: No  ? Sexual activity: Yes  ?  Partners: Male  ?  Birth control/protection: Surgical  ?Other Topics Concern  ? Not on file  ?Social History Narrative  ? Not on file  ? ?Social Determinants of Health  ? ?Financial Resource Strain: Low Risk   ? Difficulty of Paying Living Expenses: Not hard at all  ?Food Insecurity: No Food Insecurity  ? Worried About Charity fundraiser in the Last Year: Never true  ? Ran Out of Food in the Last Year: Never true  ?Transportation Needs: No Transportation Needs  ? Lack of Transportation (Medical): No  ? Lack of Transportation (Non-Medical): No  ?Physical Activity: Sufficiently Active  ? Days of Exercise per Week: 4 days  ? Minutes of Exercise per Session: 40 min  ?Stress: No Stress Concern Present  ? Feeling of Stress : Not at all  ?Social Connections: Moderately Integrated  ? Frequency of Communication with Friends and Family: More than three times a week  ? Frequency of Social Gatherings with Friends and Family: More than three times a week  ? Attends Religious Services: More than 4 times per year  ? Active Member of Clubs or Organizations: No  ? Attends Archivist Meetings: Never  ? Marital Status: Married  ?  ? ?Family History: ?The patient's family history includes ADD / ADHD in her son; Alcohol abuse in her brother, father, maternal grandfather, and paternal grandfather; Anxiety disorder in her maternal grandfather and mother; Breast cancer in her paternal grandmother; Cancer in her father and paternal grandmother; Coronary artery disease (age of onset: 23) in her mother; Depression in her brother,  father, and maternal grandfather; Drug abuse in her brother; Heart disease in her mother and sister; Hyperlipidemia in her mother and sister; Hypertension in her mother and sister; Mental illness in her maternal aunt; Miscarriages / Stillbirths in her mother; Other in her sister. ? ?ROS:   ?Review of Systems  ?Constitution: Negative for decreased appetite, fever and weight gain.  ?HENT: Negative for congestion, ear discharge, hoarse voice and sore throat.   ?Eyes: Negative for discharge, redness, vision loss in right  eye and visual halos.  ?Cardiovascular: Reports palpitations.  Negative for chest pain, dyspnea on exertion, leg swelling, orthopnea. ?Respiratory: Negative for cough, hemoptysis, shortness of breath and snoring.   ?Endocrine: Negative for heat intolerance and polyphagia.  ?Hematologic/Lymphatic: Negative for bleeding problem. Does not bruise/bleed easily.  ?Skin: Negative for flushing, nail changes, rash and suspicious lesions.  ?Musculoskeletal: Negative for arthritis, joint pain, muscle cramps, myalgias, neck pain and stiffness.  ?Gastrointestinal: Negative for abdominal pain, bowel incontinence, diarrhea and excessive appetite.  ?Genitourinary: Negative for decreased libido, genital sores and incomplete emptying.  ?Neurological: Negative for brief paralysis, focal weakness, headaches and loss of balance.  ?Psychiatric/Behavioral: Negative for altered mental status, depression and suicidal ideas.  ?Allergic/Immunologic: Negative for HIV exposure and persistent infections.  ? ? ?EKGs/Labs/Other Studies Reviewed:   ? ?The following studies were reviewed today: ? ? ?EKG:  The ekg ordered today demonstrates sinus rhythm heart rate 89 bpm. ? ?CCTA 03/01/2018 ?Aorta:  Normal size.  No calcifications.  No dissection. ?  ?Aortic Valve:  Trileaflet.  No calcifications. ?  ?Coronary Arteries: Normal coronary origin. Right and left dominance. ?  ?RCA is a medium size co-dominant artery that gives rise to  PDA. ?There is no plaque. ?  ?Left main is a large artery that gives rise to LAD and LCX arteries. ?Left main has no plaque. ?  ?LAD is a very large vessel that gives rise to one diagonal artery, ?wraps around the

## 2021-10-06 ENCOUNTER — Encounter: Payer: Self-pay | Admitting: Family Medicine

## 2021-10-08 DIAGNOSIS — R002 Palpitations: Secondary | ICD-10-CM

## 2021-10-09 NOTE — Progress Notes (Deleted)
Name: Melanie Villarreal   MRN: [id number]    DOB: June 10, 1976   Date:10/09/2021 ? ?     Progress Note ? ?Subjective ? ?Chief Complaint ? ?Follow Up ? ?HPI ? ?DMII: she has associated hypertension, dyslipidemia, obese, she also has neuropahty. She has been on Ozempic since May 2020  She denies polyphagia, polydipsia or polyuria . A1C is 5.4 % today,  she tried going down on dose of Ozempic from 1 mg to 0.5 mg but gained weight and went back to 1 mg dose, no hypoglycemic episodes.  She is on Atorvastatin for dyslipidemia and denies side effects. She told me about a scar since childhood on right first toe [employer] door scrapped her foot) she states the area is numb, but only around the scar  ? ?Dyslipidemia: Last LDL was up from 52 to 92, triglycerides also higher at 168 and HDL stable at 36 . She is now taking a full dose of Atorvastatin daily , discussed rechecking labs today but she would like to recheck it first  ?  ?HTN: bp is at goal today, she is back on Benicar 20 mg dose. She had stopped Metoprolol for a few months but she states she has been using Metoprolol prn now , she had an episode that lasted over one hour with heart rate around 130's back in June 2022. Denies chest pain or palpitation at this time  ?  ?Morbid Obesity: she has been obese since age 46. She states worse since the last pregnancy. She was on a  glutten free diet, chooses low carb choices,  walking 4 days a week, going to the gym with her husband and weight is stable now. Started diet was May 1st  2020 her weight was 235 lbs and she was  down to 187 lbs, weight is now stable in the low 190's lbs for a long time now. Doing well on current regiment  ?  ?ADHD: she states symptoms started in childhood, but was treated at age 46 , she has tried Adderal - caused anger outburst, felt tired at the end of the day, Strattera caused sedation, Ritalin caused palpitation. She has been on Vyvanse for the past 5 years and is working well for her. No side  effects of medication, except is helps a little on her appetite She states her boss notices when she does not take the medication. She states her husband and children also noticed right away when she skips the dose. She has been getting 90 day supply of 50 mg dose. We will send refill today  ?  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 Her parents were going through a divorce, mother left and stayed with her step-father, she has one brother that died as an infant and another one that died from drug overdose at age 54 ( she was  58 ). There is a family history of depression. She was on Zoloft from age 30 until year 2019 when she was switched from Zoloft to [place] because she had a relapse. ( she also states while on zoloft tried some other SSRI - not sure of the name).  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 She stopped seeing psychiatrist because of turn over of providers. She stopped taking Viibrid on May 13 th 22 because she forgot to take medication on vacation, she has been taking some oils and  is doing well without  daily medication but still takes hydroxyzine prn , she likes a specific generic medication , she will contact me with the name of manufacture so I can send it to pharmacy  ?  ?Migraine headaches: she states seldom has episodes, she states she has a tingling sensation on her scalp followed by a headache, it is described as a "zap" sensation . She has phonophobia and photophobia. She does not like taking Maxalt because of side effects, we gave her a rx Roselyn Meier but has not tired it yet  ? ?Thyroid nodule: large and palpable on right side. She has not called Endo yet  ? ?Atypical moles: she needs to contact Dermatologist, missed the phone call  ? ?Patient Active Problem List  ? Diagnosis Date Noted  ? Palpitations 10/05/2021  ? Hypertension 10/05/2021  ? Obesity (BMI 30-39.9) 10/05/2021  ?  Hypertension associated with type 2 diabetes mellitus ([place]) 12/09/2019  ? Diabetes mellitus type 2 in obese ([place]) 12/09/2019  ? Hyperlipidemia associated with type 2 diabetes mellitus ([place]) 07/02/2019  ? Urge incontinence 08/15/2018  ? Altered awareness, transient 06/17/2018  ? Headache disorder 06/17/2018  ? Family history of premature CAD 09/11/2017  ? Thyroid nodule 08/15/2017  ? LVH (left ventricular hypertrophy) 02/01/2017  ? Elevated serum glutamic pyruvic transaminase (SGPT) level 07/24/2016  ? Morbid obesity ([place]) 03/23/2016  ? Sinus tachycardia 02/24/2016  ? Valvular regurgitation 01/11/2016  ? ADHD (attention deficit hyperactivity disorder), combined type 08/30/2015  ? Generalized anxiety disorder 12/31/2014  ? Major depressive disorder, recurrent episode ([place]) 10/25/2014  ? Essential hypertension 10/25/2014  ? ? ?Past Surgical History:  ?Procedure Laterality Date  ? CESAREAN SECTION    ? ENDOMETRIAL ABLATION W/ NOVASURE  06/2008  ? estimate  ? thyroid nodule biopsy    ? TUBAL LIGATION    ? ? ?Family History  ?Problem Relation Age of Onset  ? Heart disease Mother   ? Hyperlipidemia Mother   ? Hypertension Mother   ? Miscarriages / [place] Mother   ?     2  ? Anxiety disorder Mother   ? Coronary artery disease Mother 41  ?     40 stents  ? Alcohol abuse Father   ? Cancer Father   ?     testicular  ? Depression Father   ? Heart disease Sister   ? Hyperlipidemia Sister   ? Hypertension Sister   ? Other Sister   ?      Langerhans Cell Histiocytosis  ? Drug abuse Brother   ? Alcohol abuse Brother   ? Depression Brother   ? ADD / ADHD Son   ? Mental illness Maternal Aunt   ? Alcohol abuse Maternal Grandfather   ? Depression Maternal Grandfather   ? Anxiety disorder Maternal Grandfather   ? Cancer Paternal Grandmother   ? Breast cancer Paternal Grandmother   ?     late 78's  ? Alcohol abuse Paternal Grandfather   ? ? ?Social History  ? ?Tobacco Use  ? Smoking status: Former  ?  Packs/day: 0.50  ?  Types:  Cigarettes, E-cigarettes  ?  Start date: 09/14/1995  ?  Quit date: 01/10/2021  ?  Years since quitting: 0.7  ? Smokeless tobacco: Never  ? Tobacco comments:  ?  On an off over the years. She smoked for 12 years solid, otherwise on and off   ?Substance Use Topics  ? Alcohol use: Yes  ?  Alcohol/week: 0.0 standard drinks  ?  Comment: rare  ? ? ? ?Current Outpatient Medications:  ?  atorvastatin (LIPITOR) 20 MG tablet, Take 1 tablet (20 mg total) by mouth daily., Disp: 90 tablet, Rfl: 1 ?  Homeopathic Products (FRANKINCENSE UPLIFTING) OIL, Inhale 5 drops into the lungs daily., Disp: , Rfl:  ?  hydrOXYzine (ATARAX) 50 MG tablet, TAKE ONE TABLET BY MOUTH AT BEDTIME, Disp: 90 tablet, Rfl: 0 ?  lisdexamfetamine (VYVANSE) 50 MG capsule, Take 1 capsule (50 mg total) by mouth daily., Disp: 90 capsule, Rfl: 0 ?  olmesartan (BENICAR) 20 MG tablet, Take 0.5-1 tablets (10-20 mg total) by mouth daily., Disp: 90 tablet, Rfl: 1 ?  OZEMPIC, 1 MG/DOSE, 4 MG/3ML SOPN, INJECT '1MG'$  UNDER THE SKIN EVERY WEEK, Disp: 9 mL, Rfl: 0 ?  propranolol (INDERAL) 20 MG tablet, Take 1 tablet (20 mg total) by mouth every 8 (eight) hours., Disp: 270 tablet, Rfl: 3 ?  rizatriptan (MAXALT-MLT) 10 MG disintegrating tablet, Take 1 tablet (10 mg total) by mouth as needed for migraine. May repeat in 2 hours if needed, Disp: 10 tablet, Rfl: 0 ?  Ubrogepant (UBRELVY) 100 MG TABS, Take 1 tablet by mouth daily as needed., Disp: 9 tablet, Rfl: 1 ? ?Allergies  ?Allergen Reactions  ? Penicillins Anaphylaxis  ? Latex Hives and Rash  ? ? ?I personally reviewed active problem list, medication list, allergies, family history, social history, health maintenance with the patient/caregiver today. ? ? ?ROS ? ?*** ? ?Objective ? ?There were no vitals filed for this visit. ? ?There is no height or weight on file to calculate BMI. ? ?Physical Exam ?*** ? ?Recent Results (from the past 2160 hour(s))  ?POCT HgB A1C     Status: None  ? Collection Time: 07/12/21  9:11 AM  ?Result  Value Ref Range  ? Hemoglobin A1C 5.4 4.0 - 5.6 %  ? HbA1c POC (<> result, manual entry)    ? HbA1c, POC (prediabetic range)    ? HbA1c, POC (controlled diabetic range)    ? ? ?Diabetic Foot Exam: ?Diabetic Foot Exam -

## 2021-10-10 ENCOUNTER — Ambulatory Visit: Payer: 59 | Admitting: Family Medicine

## 2021-10-10 DIAGNOSIS — E1169 Type 2 diabetes mellitus with other specified complication: Secondary | ICD-10-CM

## 2021-10-12 ENCOUNTER — Encounter: Payer: Self-pay | Admitting: Family Medicine

## 2021-10-12 ENCOUNTER — Other Ambulatory Visit: Payer: Self-pay | Admitting: Family Medicine

## 2021-10-12 ENCOUNTER — Ambulatory Visit (INDEPENDENT_AMBULATORY_CARE_PROVIDER_SITE_OTHER): Payer: PRIVATE HEALTH INSURANCE | Admitting: Family Medicine

## 2021-10-12 VITALS — BP 134/72 | HR 98 | Resp 16 | Ht 64.0 in | Wt 185.0 lb

## 2021-10-12 DIAGNOSIS — R232 Flushing: Secondary | ICD-10-CM

## 2021-10-12 DIAGNOSIS — F325 Major depressive disorder, single episode, in full remission: Secondary | ICD-10-CM | POA: Diagnosis not present

## 2021-10-12 DIAGNOSIS — F419 Anxiety disorder, unspecified: Secondary | ICD-10-CM

## 2021-10-12 DIAGNOSIS — I152 Hypertension secondary to endocrine disorders: Secondary | ICD-10-CM

## 2021-10-12 DIAGNOSIS — G43109 Migraine with aura, not intractable, without status migrainosus: Secondary | ICD-10-CM

## 2021-10-12 DIAGNOSIS — E785 Hyperlipidemia, unspecified: Secondary | ICD-10-CM

## 2021-10-12 DIAGNOSIS — E782 Mixed hyperlipidemia: Secondary | ICD-10-CM

## 2021-10-12 DIAGNOSIS — F902 Attention-deficit hyperactivity disorder, combined type: Secondary | ICD-10-CM | POA: Diagnosis not present

## 2021-10-12 DIAGNOSIS — R531 Weakness: Secondary | ICD-10-CM

## 2021-10-12 DIAGNOSIS — E1159 Type 2 diabetes mellitus with other circulatory complications: Secondary | ICD-10-CM

## 2021-10-12 DIAGNOSIS — E669 Obesity, unspecified: Secondary | ICD-10-CM

## 2021-10-12 DIAGNOSIS — E1169 Type 2 diabetes mellitus with other specified complication: Secondary | ICD-10-CM | POA: Diagnosis not present

## 2021-10-12 MED ORDER — LISDEXAMFETAMINE DIMESYLATE 50 MG PO CAPS: 50.0000 mg | ORAL_CAPSULE | Freq: Every day | ORAL | 0 refills | Status: DC

## 2021-10-12 MED ORDER — HYDROXYZINE HCL 50 MG PO TABS: 50.0000 mg | ORAL_TABLET | Freq: Every day | ORAL | 0 refills | Status: DC

## 2021-10-12 NOTE — Progress Notes (Signed)
Name: Melanie Villarreal   MRN: [id number]    DOB: 11/26/1975   Date:10/12/2021 ? ?     Progress Note ? ?Subjective ? ?Chief Complaint ? ?Medication Refill ? ?HPI ? ?DMII: she has associated hypertension, dyslipidemia, obese, she also has neuropahty. She has been on Ozempic since May 2020  She denies polyphagia, polydipsia or polyuria . A1C has been normal,  she tried going down on dose of Ozempic from 1 mg to 0.5 mg but gained weight and went back to 1 mg dose, no hypoglycemic episodes.  She is on Atorvastatin for dyslipidemia and denies side effects. She told me about a scar since childhood on right first toe [employer] door scrapped her foot) she states the area is numb, but only around the scar  ? ?Weakness: episodes are random, started in her 20's but used to be very sporadic, but over the past 6 weeks she had multiple episodes , she went to cardiologist and has a Zio monitor in place.  She was seen for similar symptoms in 2019 and had an unremarkable echo, also negative cardiac calcium score. She states she is still concerned due to significant family history of heart disease, specially mother that had stent - advised her to wait for results and monitor. She states episodes are described as feeling fatigued/weak, dizziness, cold sensation or hot flashes. ? ?Dyslipidemia: Last LDL was up from 52 to 92, triglycerides also higher at 168 and HDL stable at 36 . She is now taking a full dose of Atorvastatin daily , we will recheck labs next visit  ?  ?HTN: bp is at goal today, she is back on Benicar 20 mg dose and back on metoprolol, heart rate is not going up anymore, cardiologist sent rx for propranolol but she has not switched yet and will discuss with her during follow up BP is at goal  ?  ?Morbid Obesity: she has been obese since age 47. She states worse since the last pregnancy. She was on a  glutten free diet, chooses low carb choices,  walking 4 days a week, going to the gym with her husband and weight is stable  now. Started diet was May 1st  2020 her weight was 235 lbs and she was  down to 187 lbs, weight is now stable in the low 190's lbs for a long time now. Doing well on current regiment  ?  ?ADHD: she states symptoms started in childhood, but was treated at age 55 , she has tried Adderal - caused anger outburst, felt tired at the end of the day, Strattera caused sedation, Ritalin caused palpitation. She has been on Vyvanse for the past 5 years and is working well for her. No side effects of medication, except is helps a little on her appetite She states her boss notices when she does not take the medication. She states her husband and children also noticed right away when she skips the dose. She has been getting 90 day supply of 50 mg dose. No changes, needs refills  ?  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 Her parents were going through a divorce, mother left and stayed with her step-father, she has one brother that died as an infant and another one that died from drug overdose at age 11 ( she was  35 ). There is a family history of depression. She was on Zoloft from age 25 until year 2019 when she was switched from  Zoloft to Viibrid because she had a relapse. ( she also states while on zoloft tried some other SSRI - not sure of the name).  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 She stopped seeing psychiatrist because of turn over of providers. She stopped taking Viibrid on May 13 th 22 because she forgot to take medication on vacation and felt fine without it, she prefers taking prn medication for nwo  ?  ?Migraine headaches: she states seldom has episodes, she states she has a tingling sensation on her scalp followed by a headache, it is described as a "zap" sensation . She has phonophobia and photophobia. She does not like taking Maxalt because of side effects, we gave her a rx Roselyn Meier but has not tired  it yet Unchanged  ? ?Thyroid nodule: large and palpable on right side. She needs to follow up with Endo  ? ?Atypical moles: she needs to contact Dermatologist, reminded her again  ? ? ?Patient Active Problem List  ? Diagnosis Date Noted  ? Palpitations 10/05/2021  ? Hypertension 10/05/2021  ? Obesity (BMI 30-39.9) 10/05/2021  ? Hypertension associated with type 2 diabetes mellitus ([place]) 12/09/2019  ? Diabetes mellitus type 2 in obese ([place]) 12/09/2019  ? Hyperlipidemia associated with type 2 diabetes mellitus ([place]) 07/02/2019  ? Urge incontinence 08/15/2018  ? Altered awareness, transient 06/17/2018  ? Headache disorder 06/17/2018  ? Family history of premature CAD 09/11/2017  ? Thyroid nodule 08/15/2017  ? LVH (left ventricular hypertrophy) 02/01/2017  ? Elevated serum glutamic pyruvic transaminase (SGPT) level 07/24/2016  ? Morbid obesity ([place]) 03/23/2016  ? Sinus tachycardia 02/24/2016  ? Valvular regurgitation 01/11/2016  ? ADHD (attention deficit hyperactivity disorder), combined type 08/30/2015  ? Generalized anxiety disorder 12/31/2014  ? Major depressive disorder, recurrent episode ([place]) 10/25/2014  ? Essential hypertension 10/25/2014  ? ? ?Past Surgical History:  ?Procedure Laterality Date  ? CESAREAN SECTION    ? ENDOMETRIAL ABLATION W/ NOVASURE  06/2008  ? estimate  ? thyroid nodule biopsy    ? TUBAL LIGATION    ? ? ?Family History  ?Problem Relation Age of Onset  ? Heart disease Mother   ? Hyperlipidemia Mother   ? Hypertension Mother   ? Miscarriages / [place] Mother   ?     2  ? Anxiety disorder Mother   ? Coronary artery disease Mother 26  ?     27 stents  ? Alcohol abuse Father   ? Cancer Father   ?     testicular  ? Depression Father   ? Heart disease Sister   ? Hyperlipidemia Sister   ? Hypertension Sister   ? Other Sister   ?      Langerhans Cell Histiocytosis  ? Drug abuse Brother   ? Alcohol abuse Brother   ? Depression Brother   ? ADD / ADHD Son   ? Mental illness Maternal Aunt   ? Alcohol  abuse Maternal Grandfather   ? Depression Maternal Grandfather   ? Anxiety disorder Maternal Grandfather   ? Cancer Paternal Grandmother   ? Breast cancer Paternal Grandmother   ?     late 52's  ? Alcohol abuse Paternal Grandfather   ? ? ?Social History  ? ?Tobacco Use  ? Smoking status: Former  ?  Packs/day: 0.50  ?  Types: Cigarettes, E-cigarettes  ?  Start date: 09/14/1995  ?  Quit date: 01/10/2021  ?  Years since quitting: 0.7  ? Smokeless tobacco: Never  ? Tobacco comments:  ?  On an off over the years. She smoked for 12 years solid, otherwise on and off   ?Substance Use Topics  ? Alcohol use: Yes  ?  Alcohol/week: 0.0 standard drinks  ?  Comment: rare  ? ? ? ?Current Outpatient Medications:  ?  atorvastatin (LIPITOR) 20 MG tablet, Take 1 tablet (20 mg total) by mouth daily., Disp: 90 tablet, Rfl: 1 ?  Homeopathic Products (FRANKINCENSE UPLIFTING) OIL, Inhale 5 drops into the lungs daily., Disp: , Rfl:  ?  hydrOXYzine (ATARAX) 50 MG tablet, TAKE ONE TABLET BY MOUTH AT BEDTIME, Disp: 90 tablet, Rfl: 0 ?  lisdexamfetamine (VYVANSE) 50 MG capsule, Take 1 capsule (50 mg total) by mouth daily., Disp: 90 capsule, Rfl: 0 ?  olmesartan (BENICAR) 20 MG tablet, Take 0.5-1 tablets (10-20 mg total) by mouth daily., Disp: 90 tablet, Rfl: 1 ?  OZEMPIC, 1 MG/DOSE, 4 MG/3ML SOPN, INJECT '1MG'$  UNDER THE SKIN EVERY WEEK, Disp: 9 mL, Rfl: 0 ?  propranolol (INDERAL) 20 MG tablet, Take 1 tablet (20 mg total) by mouth every 8 (eight) hours., Disp: 270 tablet, Rfl: 3 ?  rizatriptan (MAXALT-MLT) 10 MG disintegrating tablet, Take 1 tablet (10 mg total) by mouth as needed for migraine. May repeat in 2 hours if needed, Disp: 10 tablet, Rfl: 0 ?  Ubrogepant (UBRELVY) 100 MG TABS, Take 1 tablet by mouth daily as needed., Disp: 9 tablet, Rfl: 1 ? ?Allergies  ?Allergen Reactions  ? Penicillins Anaphylaxis  ? Latex Hives and Rash  ? ? ?I personally reviewed active problem list, medication list, allergies, family history, social history, health  maintenance with the patient/caregiver today. ? ? ?ROS ? ?Constitutional: Negative for fever or weight change.  ?Respiratory: Negative for cough and shortness of breath.   ?Cardiovascular: Negative for chest

## 2021-10-13 LAB — CBC WITH DIFFERENTIAL/PLATELET
Absolute Monocytes: 614 cells/uL (ref 200–950)
Basophils Absolute: 31 cells/uL (ref 0–200)
Basophils Relative: 0.3 %
Eosinophils Absolute: 114 cells/uL (ref 15–500)
Eosinophils Relative: 1.1 %
HCT: 42.1 % (ref 35.0–45.0)
Hemoglobin: 14.1 g/dL (ref 11.7–15.5)
Lymphs Abs: 3006 cells/uL (ref 850–3900)
MCH: 29.1 pg (ref 27.0–33.0)
MCHC: 33.5 g/dL (ref 32.0–36.0)
MCV: 86.8 fL (ref 80.0–100.0)
MPV: 10.9 fL (ref 7.5–12.5)
Monocytes Relative: 5.9 %
Neutro Abs: 6635 cells/uL (ref 1500–7800)
Neutrophils Relative %: 63.8 %
Platelets: 319 10*3/uL (ref 140–400)
RBC: 4.85 10*6/uL (ref 3.80–5.10)
RDW: 12.1 % (ref 11.0–15.0)
Total Lymphocyte: 28.9 %
WBC: 10.4 10*3/uL (ref 3.8–10.8)

## 2021-10-13 LAB — COMPLETE METABOLIC PANEL WITH GFR
AG Ratio: 1.8 (calc) (ref 1.0–2.5)
ALT: 20 U/L (ref 6–29)
AST: 13 U/L (ref 10–30)
Albumin: 4.4 g/dL (ref 3.6–5.1)
Alkaline phosphatase (APISO): 77 U/L (ref 31–125)
BUN: 15 mg/dL (ref 7–25)
CO2: 27 mmol/L (ref 20–32)
Calcium: 9.1 mg/dL (ref 8.6–10.2)
Chloride: 106 mmol/L (ref 98–110)
Creat: 0.68 mg/dL (ref 0.50–0.99)
Globulin: 2.4 g/dL (calc) (ref 1.9–3.7)
Glucose, Bld: 128 mg/dL — ABNORMAL HIGH (ref 65–99)
Potassium: 3.8 mmol/L (ref 3.5–5.3)
Sodium: 140 mmol/L (ref 135–146)
Total Bilirubin: 0.7 mg/dL (ref 0.2–1.2)
Total Protein: 6.8 g/dL (ref 6.1–8.1)
eGFR: 111 mL/min/{1.73_m2} (ref >=60)

## 2021-10-13 LAB — HEMOGLOBIN A1C
Hgb A1c MFr Bld: 5.7 % of total Hgb — ABNORMAL HIGH (ref <5.7)
Mean Plasma Glucose: 117 mg/dL
eAG (mmol/L): 6.5 mmol/L

## 2021-10-13 LAB — TSH+FREE T4: TSH W/REFLEX TO FT4: 1.08 mIU/L

## 2021-10-13 LAB — LIPID PANEL
Cholesterol: 116 mg/dL (ref <200)
HDL: 35 mg/dL — ABNORMAL LOW (ref >=50)
LDL Cholesterol (Calc): 57 mg/dL (calc)
Non-HDL Cholesterol (Calc): 81 mg/dL (calc) (ref <130)
Total CHOL/HDL Ratio: 3.3 (calc) (ref <5.0)
Triglycerides: 166 mg/dL — ABNORMAL HIGH (ref <150)

## 2021-10-28 ENCOUNTER — Other Ambulatory Visit: Payer: Self-pay | Admitting: Family Medicine

## 2021-10-28 DIAGNOSIS — E782 Mixed hyperlipidemia: Secondary | ICD-10-CM

## 2021-10-28 DIAGNOSIS — E119 Type 2 diabetes mellitus without complications: Secondary | ICD-10-CM

## 2021-10-28 DIAGNOSIS — F419 Anxiety disorder, unspecified: Secondary | ICD-10-CM

## 2021-10-28 DIAGNOSIS — F325 Major depressive disorder, single episode, in full remission: Secondary | ICD-10-CM

## 2021-12-28 ENCOUNTER — Ambulatory Visit: Payer: 59 | Admitting: Cardiology

## 2022-01-03 ENCOUNTER — Encounter: Payer: Self-pay | Admitting: Cardiology

## 2022-01-12 NOTE — Progress Notes (Signed)
Name: Melanie Villarreal   MRN: [id number]    DOB: 21-Jan-1976   Date:01/15/2022       Progress Note  Subjective  Chief Complaint  Follow up   HPI  DMII: she has associated hypertension, dyslipidemia, obese, she also has neuropahty. She has been on Ozempic since May 2020  She denies polyphagia, polydipsia or polyuria . A1C has been normal,  she tried going down on dose of Ozempic from 1 mg to 0.5 mg but gained weight and went back to 1 mg dose, no hypoglycemic episodes.  She is on Atorvastatin for dyslipidemia and denies side effects. She states had some cranial sacral therapy and neuropathy has improved. LDL is at goal   Weakness: episodes are random, started in her 20's  and was having recurrent symptoms earlier in 2023 she went to cardiologist and has a Zio monitor in place.  She was seen for similar symptoms in 2019 and had an unremarkable echo, also negative cardiac calcium score and normal ZIO  monitor . She states she is still concerned due to significant family history of heart disease, specially mother that had stent She states episodes resolved since last visit   Dyslipidemia: Last LDL was up from 52 to 92 and last visit it was 57  , last triglycerides was 166 She is compliant with Atorvastatin    HTN: bp is at goal today, she is back on Benicar 20 mg dose and bon metoprolol, heart rate is at goal. Continue current dose    Obesity : she has been obese since age 67. She states worse since the last pregnancy. She was on a  glutten free diet, chooses low carb choices,  walking 4 days a week, going to the gym with her husband and weight is stable now. Started diet was May 1st  2020 her weight was 235 lbs and she was  down to 183 lbs, weight is now stable in the mid 180 's lbs now    ADHD: she states symptoms started in childhood, but was treated at age 56 , she has tried Adderal - caused anger outburst, felt tired at the end of the day, Strattera caused sedation, Ritalin caused palpitation. She  has been on Vyvanse for the past 5 years and is working well for her. No side effects of medication, except is helps a little on her appetite She states her boss notices when she does not take the medication. She states her husband and children also noticed right away when she skips the dose. She has been getting 90 day supply of 50 mg dose. 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 Her parents were going through a divorce, mother left and stayed with her step-father, she has one brother that died as an infant and another one that died from drug overdose at age 49 ( she was  51 ). There is a family history of depression. She was on Zoloft from age 10 until year 2019 when she was switched from Zoloft to Viibrid because she had a relapse. ( she also states while on zoloft tried some other SSRI - not sure of the name).  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 She stopped seeing psychiatrist because of turn over of providers. She stopped taking Viibrid on May 13 th 22 because she forgot to take medication on vacation and felt fine  without it, she is doing well on Hydroxizine prn, states Vyvanse also helps with her mood    Migraine headaches: she states seldom has episodes, she states she has a tingling sensation on her scalp followed by a headache, it is described as a "zap" sensation . She has phonophobia and photophobia. She does not like taking Maxalt because of side effects, we gave her a rx Bernita Raisin but has not tired it yet since no migraine episodes since   Thyroid nodule: large and palpable on right side. She needs to follow up with Endo , she made an appointment but missed it   Atypical moles: she needs to contact Dermatologist, reminded her again .Unchanged   Patient Active Problem List   Diagnosis Date Noted   Hypertension associated with diabetes (HCC) 01/15/2022   Major  depression in remission (HCC) 01/15/2022   Palpitations 10/05/2021   Hypertension 10/05/2021   Obesity (BMI 30-39.9) 10/05/2021   Hypertension associated with type 2 diabetes mellitus (HCC) 12/09/2019   Hyperlipidemia associated with type 2 diabetes mellitus (HCC) 07/02/2019   Urge incontinence 08/15/2018   Headache disorder 06/17/2018   Family history of premature CAD 09/11/2017   Thyroid nodule 08/15/2017   LVH (left ventricular hypertrophy) 02/01/2017   Sinus tachycardia 02/24/2016   Valvular regurgitation 01/11/2016   ADHD (attention deficit hyperactivity disorder), combined type 08/30/2015   GAD (generalized anxiety disorder) 12/31/2014   Major depressive disorder, recurrent episode (HCC) 10/25/2014   Essential hypertension 10/25/2014    Past Surgical History:  Procedure Laterality Date   CESAREAN SECTION     ENDOMETRIAL ABLATION W/ NOVASURE  06/2008   estimate   thyroid nodule biopsy     TUBAL LIGATION      Family History  Problem Relation Age of Onset   Heart disease Mother    Hyperlipidemia Mother    Hypertension Mother    Miscarriages / [place] Mother        2   Anxiety disorder Mother    Coronary artery disease Mother 87       11 stents   Alcohol abuse Father    Cancer Father        testicular   Depression Father    Heart disease Sister    Hyperlipidemia Sister    Hypertension Sister    Other Sister         Langerhans Cell Histiocytosis   Drug abuse Brother    Alcohol abuse Brother    Depression Brother    ADD / ADHD Son    Mental illness Maternal Aunt    Alcohol abuse Maternal Grandfather    Depression Maternal Grandfather    Anxiety disorder Maternal Grandfather    Cancer Paternal Grandmother    Breast cancer Paternal Grandmother        late 70's   Alcohol abuse Paternal Grandfather     Social History   Tobacco Use   Smoking status: Former    Packs/day: 0.50    Types: Cigarettes, E-cigarettes    Start date: 09/14/1995    Quit date:  01/10/2021    Years since quitting: 1.0   Smokeless tobacco: Never   Tobacco comments:    On an off over the years. She smoked for 12 years solid, otherwise on and off   Substance Use Topics   Alcohol use: Yes    Alcohol/week: 0.0 standard drinks of alcohol    Comment: rare     Current Outpatient Medications:    atorvastatin (LIPITOR) 20 MG tablet,  TAKE ONE TABLET BY MOUTH ONE TIME DAILY, Disp: 90 tablet, Rfl: 1   Homeopathic Products (FRANKINCENSE UPLIFTING) OIL, Inhale 5 drops into the lungs daily., Disp: , Rfl:    metoprolol succinate (TOPROL-XL) 25 MG 24 hr tablet, Take 25 mg by mouth daily., Disp: , Rfl:    rizatriptan (MAXALT-MLT) 10 MG disintegrating tablet, Take 1 tablet (10 mg total) by mouth as needed for migraine. May repeat in 2 hours if needed, Disp: 10 tablet, Rfl: 0   Ubrogepant (UBRELVY) 100 MG TABS, Take 1 tablet by mouth daily as needed., Disp: 9 tablet, Rfl: 1   hydrOXYzine (ATARAX) 50 MG tablet, Take 1 tablet (50 mg total) by mouth at bedtime., Disp: 90 tablet, Rfl: 0   lisdexamfetamine (VYVANSE) 50 MG capsule, Take 1 capsule (50 mg total) by mouth daily., Disp: 90 capsule, Rfl: 0   olmesartan (BENICAR) 20 MG tablet, Take 1 tablet (20 mg total) by mouth daily., Disp: 90 tablet, Rfl: 1   Semaglutide, 1 MG/DOSE, (OZEMPIC, 1 MG/DOSE,) 4 MG/3ML SOPN, INJECT 1MG  UNDER THE SKIN EVERY WEEK, Disp: 9 mL, Rfl: 1  Allergies  Allergen Reactions   Penicillins Anaphylaxis   Latex Hives and Rash    I personally reviewed active problem list, medication list, allergies, family history, social history, health maintenance with the patient/caregiver today.   ROS  Constitutional: Negative for fever or weight change.  Respiratory: Negative for cough and shortness of breath.   Cardiovascular: Negative for chest pain or palpitations.  Gastrointestinal: Negative for abdominal pain, no bowel changes.  Musculoskeletal: Negative for gait problem or joint swelling.  Skin: Negative for  rash.  Neurological: Negative for dizziness or headache.  No other specific complaints in a complete review of systems (except as listed in HPI above).   Objective  Vitals:   01/15/22 0946  BP: 122/76  Pulse: 91  Resp: 16  SpO2: 98%  Weight: 185 lb (83.9 kg)  Height: 5\' 4"  (1.626 m)    Body mass index is 31.76 kg/m.  Physical Exam  Constitutional: Patient appears well-developed and well-nourished. Obese  No distress.  HEENT: head atraumatic, normocephalic, pupils equal and reactive to light, neck supple, thyromegaly, larger on right side Cardiovascular: Normal rate, regular rhythm and normal heart sounds.  No murmur heard. No BLE edema. Pulmonary/Chest: Effort normal and breath sounds normal. No respiratory distress. Abdominal: Soft.  There is no tenderness. Psychiatric: Patient has a normal mood and affect. behavior is normal. Judgment and thought content normal.   Recent Results (from the past 2160 hour(s))  POCT HgB A1C     Status: None   Collection Time: 01/15/22  9:48 AM  Result Value Ref Range   Hemoglobin A1C 5.4 4.0 - 5.6 %   HbA1c POC (<> result, manual entry)     HbA1c, POC (prediabetic range)     HbA1c, POC (controlled diabetic range)      Diabetic Foot Exam: Diabetic Foot Exam - Simple   Simple Foot Form Visual Inspection No deformities, no ulcerations, no other skin breakdown bilaterally: Yes Sensation Testing Intact to touch and monofilament testing bilaterally: Yes Pulse Check Posterior Tibialis and Dorsalis pulse intact bilaterally: Yes Comments     PHQ2/9:    01/15/2022    9:47 AM 10/12/2021   10:54 AM 09/05/2021    8:28 AM 07/12/2021    9:01 AM 05/22/2021    8:26 AM  Depression screen PHQ 2/9  Decreased Interest 0 0 0 0 0  Down, Depressed, Hopeless 0 0  0 0 0  PHQ - 2 Score 0 0 0 0 0  Altered sleeping 0 0 0 0 0  Tired, decreased energy 0 0 0 0 0  Change in appetite 0 0 0 0 0  Feeling bad or failure about yourself  0 0 0 0 0  Trouble  concentrating 0 0 0 0 0  Moving slowly or fidgety/restless 0 0 0 0 0  Suicidal thoughts 0 0 0 0 0  PHQ-9 Score 0 0 0 0 0  Difficult doing work/chores   Not difficult at all      phq 9 is negative   Fall Risk:    01/15/2022    9:47 AM 10/12/2021   10:54 AM 09/05/2021    8:27 AM 07/12/2021    9:00 AM 05/22/2021    8:26 AM  Fall Risk   Falls in the past year? 0 0 0 0 0  Number falls in past yr: 0 0 0 0 0  Injury with Fall? 0 0 0 0 0  Risk for fall due to : No Fall Risks No Fall Risks No Fall Risks No Fall Risks No Fall Risks  Follow up Falls prevention discussed Falls prevention discussed Falls prevention discussed Falls prevention discussed Falls prevention discussed      Functional Status Survey: Is the patient deaf or have difficulty hearing?: No Does the patient have difficulty seeing, even when wearing glasses/contacts?: No Does the patient have difficulty concentrating, remembering, or making decisions?: Yes Does the patient have difficulty walking or climbing stairs?: No Does the patient have difficulty dressing or bathing?: No Does the patient have difficulty doing errands alone such as visiting a doctor's office or shopping?: No    Assessment & Plan   1. Hyperlipidemia associated with type 2 diabetes mellitus (HCC)  - POCT HgB A1C - HM Diabetes Foot Exam - Semaglutide, 1 MG/DOSE, (OZEMPIC, 1 MG/DOSE,) 4 MG/3ML SOPN; INJECT 1MG  UNDER THE SKIN EVERY WEEK  Dispense: 9 mL; Refill: 1  2. Hypertension associated with diabetes (HCC)  - POCT HgB A1C - HM Diabetes Foot Exam - Semaglutide, 1 MG/DOSE, (OZEMPIC, 1 MG/DOSE,) 4 MG/3ML SOPN; INJECT 1MG  UNDER THE SKIN EVERY WEEK  Dispense: 9 mL; Refill: 1  3. ADHD (attention deficit hyperactivity disorder), combined type  - lisdexamfetamine (VYVANSE) 50 MG capsule; Take 1 capsule (50 mg total) by mouth daily.  Dispense: 90 capsule; Refill: 0  4. Essential hypertension  - olmesartan (BENICAR) 20 MG tablet; Take 1 tablet (20  mg total) by mouth daily.  Dispense: 90 tablet; Refill: 1  5. Major depression in remission (HCC)  - hydrOXYzine (ATARAX) 50 MG tablet; Take 1 tablet (50 mg total) by mouth at bedtime.  Dispense: 90 tablet; Refill: 0  6. GAD (generalized anxiety disorder)  - hydrOXYzine (ATARAX) 50 MG tablet; Take 1 tablet (50 mg total) by mouth at bedtime.  Dispense: 90 tablet; Refill: 0

## 2022-01-15 ENCOUNTER — Encounter: Payer: Self-pay | Admitting: Family Medicine

## 2022-01-15 ENCOUNTER — Ambulatory Visit (INDEPENDENT_AMBULATORY_CARE_PROVIDER_SITE_OTHER): Payer: PRIVATE HEALTH INSURANCE | Admitting: Family Medicine

## 2022-01-15 VITALS — BP 122/76 | HR 91 | Resp 16 | Ht 64.0 in | Wt 185.0 lb

## 2022-01-15 DIAGNOSIS — E1169 Type 2 diabetes mellitus with other specified complication: Secondary | ICD-10-CM

## 2022-01-15 DIAGNOSIS — F902 Attention-deficit hyperactivity disorder, combined type: Secondary | ICD-10-CM | POA: Diagnosis not present

## 2022-01-15 DIAGNOSIS — F411 Generalized anxiety disorder: Secondary | ICD-10-CM

## 2022-01-15 DIAGNOSIS — E1159 Type 2 diabetes mellitus with other circulatory complications: Secondary | ICD-10-CM | POA: Diagnosis not present

## 2022-01-15 DIAGNOSIS — I10 Essential (primary) hypertension: Secondary | ICD-10-CM

## 2022-01-15 DIAGNOSIS — I152 Hypertension secondary to endocrine disorders: Secondary | ICD-10-CM | POA: Diagnosis not present

## 2022-01-15 DIAGNOSIS — E785 Hyperlipidemia, unspecified: Secondary | ICD-10-CM | POA: Diagnosis not present

## 2022-01-15 DIAGNOSIS — F325 Major depressive disorder, single episode, in full remission: Secondary | ICD-10-CM

## 2022-01-15 LAB — POCT GLYCOSYLATED HEMOGLOBIN (HGB A1C): Hemoglobin A1C: 5.4 % (ref 4.0–5.6)

## 2022-01-15 MED ORDER — HYDROXYZINE HCL 50 MG PO TABS: 50.0000 mg | ORAL_TABLET | Freq: Every day | ORAL | 0 refills | Status: DC

## 2022-01-15 MED ORDER — OZEMPIC (1 MG/DOSE) 4 MG/3ML ~~LOC~~ SOPN: PEN_INJECTOR | SUBCUTANEOUS | 1 refills | Status: DC

## 2022-01-15 MED ORDER — OLMESARTAN MEDOXOMIL 20 MG PO TABS: 20.0000 mg | ORAL_TABLET | Freq: Every day | ORAL | 1 refills | Status: DC

## 2022-01-15 MED ORDER — LISDEXAMFETAMINE DIMESYLATE 50 MG PO CAPS: 50.0000 mg | ORAL_CAPSULE | Freq: Every day | ORAL | 0 refills | Status: DC

## 2022-01-16 ENCOUNTER — Other Ambulatory Visit: Payer: Self-pay | Admitting: Family Medicine

## 2022-01-16 DIAGNOSIS — I10 Essential (primary) hypertension: Secondary | ICD-10-CM

## 2022-01-19 ENCOUNTER — Encounter: Payer: Self-pay | Admitting: Family Medicine

## 2022-01-22 ENCOUNTER — Other Ambulatory Visit: Payer: Self-pay

## 2022-01-22 DIAGNOSIS — E041 Nontoxic single thyroid nodule: Secondary | ICD-10-CM

## 2022-01-22 MED ORDER — METOPROLOL SUCCINATE ER 25 MG PO TB24: 25.0000 mg | ORAL_TABLET | Freq: Every day | ORAL | 3 refills | Status: DC

## 2022-01-26 ENCOUNTER — Other Ambulatory Visit: Payer: Self-pay | Admitting: Family Medicine

## 2022-01-26 DIAGNOSIS — F411 Generalized anxiety disorder: Secondary | ICD-10-CM

## 2022-01-26 DIAGNOSIS — F325 Major depressive disorder, single episode, in full remission: Secondary | ICD-10-CM

## 2022-02-22 ENCOUNTER — Encounter: Payer: Self-pay | Admitting: Family Medicine

## 2022-04-11 ENCOUNTER — Encounter: Payer: Self-pay | Admitting: Family Medicine

## 2022-04-17 NOTE — Progress Notes (Unsigned)
Name: Melanie Villarreal   MRN: [id number]    DOB: [birth year]   Date:04/18/2022       Progress Note  Subjective  Chief Complaint  Follow Up  HPI  DMII: she has associated hypertension, dyslipidemia, obese, she also has neuropahty. She has been on Ozempic since May 2020  She denies polyphagia, polydipsia or polyuria . A1C has been normal, she has been doing well on Ozempic 1 mg dose .  She is on Atorvastatin for dyslipidemia and denies side effects.  Weakness: episodes are random, started in her 20's  and was having recurrent symptoms earlier in 46 she went to cardiologist and has a Zio monitor in place.  She was seen for similar symptoms in 2019 and had an unremarkable echo, also negative cardiac calcium score and normal ZIO  monitor . She states she is still concerned due to significant family history of heart disease, specially mother that had stent She states episodes resolved since last visit   Dyslipidemia: Last LDL was up from 22 to 81 and last visit it was 110  , last triglycerides was 166 She is compliant with Atorvastatin and denies side effects    HTN: bp is at goal today, she is back on Benicar 20 mg dose and bon metoprolol, heart rate is at goal. Continue current dose    Obesity : she has been obese since age 18. She states worse since the last pregnancy. She was on a  glutten free diet, chooses low carb choices,  walking 2 days a week. Started diet was May 1st  2020 her weight was 235 lbs and she was  down to 181 lbs   ADHD: she states symptoms started in childhood, but was treated at age 20 , she has tried Adderal - caused anger outburst, felt tired at the end of the day, Strattera caused sedation, Ritalin caused palpitation. She has been on Vyvanse for the past 5 years and is working well for her. No side effects of medication, except is helps a little on her appetite She states her boss notices when she does not take the medication. She states her husband and children also noticed  right away when she skips the dose. She has been getting 90 day supply of 50 mg dose. Reminded her it is a controlled medication. 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 Her parents were going through a divorce, mother left and stayed with her step-father, she has one brother that died as an infant and another one that died from drug overdose at age 44 ( she was  34 ). There is a family history of depression. She was on Zoloft from age 48 until year 2019 when she was switched from Zoloft to [place] because she had a relapse. ( she also states while on zoloft tried some other SSRI - not sure of the name).  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 She stopped seeing psychiatrist because of turn over of providers. She stopped taking Viibrid on May 13 th 22 because she forgot to take medication on vacation and felt fine without it, she is doing well on Hydroxizine prn, states Vyvanse also helps with her mood . She is worried since her youngest sister - that is 105 years younger is moving in with her, she is under treatment for narcotic addiction and fentanyl overdose  Migraine headaches: she states seldom has episodes, she states she has a tingling sensation on her scalp followed by a headache, it is described as a "zap" sensation . She has phonophobia and photophobia. She does not like taking Maxalt because of side effects and has some Ubrelvy at the pharmacy but has not started it yet. 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 She is going to see Dr. 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 and is waiting to see a new dermatologist   Patient Active Problem List   Diagnosis Date Noted   Hypertension  associated with diabetes ([place]) 01/15/2022   Major depression in remission ([place]) 01/15/2022   Palpitations 10/05/2021   Hypertension 10/05/2021   Obesity (BMI 30-39.9) 10/05/2021   Hypertension associated with type 2 diabetes mellitus ([place]) 12/09/2019   Hyperlipidemia associated with type 2 diabetes mellitus ([place]) 07/02/2019   Urge incontinence 08/15/2018   Headache disorder 06/17/2018   Family history of premature CAD 09/11/2017   Thyroid nodule 08/15/2017   LVH (left ventricular hypertrophy) 02/01/2017   Sinus tachycardia 02/24/2016   Valvular regurgitation 01/11/2016   ADHD (attention deficit hyperactivity disorder), combined type 08/30/2015   GAD (generalized anxiety disorder) 12/31/2014   Major depressive disorder, recurrent episode ([place]) 10/25/2014   Essential hypertension 10/25/2014    Past Surgical History:  Procedure Laterality Date   CESAREAN SECTION     ENDOMETRIAL ABLATION W/ NOVASURE  06/2008   estimate   thyroid nodule biopsy     TUBAL LIGATION      Family History  Problem Relation Age of Onset   Heart disease Mother    Hyperlipidemia Mother    Hypertension Mother    Miscarriages / [place] Mother        2   Anxiety disorder Mother    Coronary artery disease Mother 45       11 stents   Alcohol abuse Father    Cancer Father        testicular   Depression Father    Heart disease Sister    Hyperlipidemia Sister    Hypertension Sister    Other Sister         Langerhans Cell Histiocytosis   Drug abuse Brother    Alcohol abuse Brother    Depression Brother    ADD / ADHD Son    Mental illness Maternal Aunt    Alcohol abuse Maternal Grandfather    Depression Maternal Grandfather    Anxiety disorder Maternal Grandfather    Cancer Paternal Grandmother    Breast cancer Paternal Grandmother        late 70's   Alcohol abuse Paternal Grandfather     Social History   Tobacco Use   Smoking status: Former    Packs/day: 0.50    Types: Cigarettes,  E-cigarettes    Start date: 09/14/1995    Quit date: 01/10/2021    Years since quitting: 1.2   Smokeless tobacco: Never   Tobacco comments:    On an off over the years. She smoked for 12 years solid, otherwise on and off   Substance Use Topics   Alcohol use: Yes    Alcohol/week: 0.0 standard drinks of alcohol    Comment: rare     Current Outpatient Medications:    atorvastatin (LIPITOR) 20 MG tablet, TAKE ONE TABLET  BY MOUTH ONE TIME DAILY, Disp: 90 tablet, Rfl: 1   Homeopathic Products (FRANKINCENSE UPLIFTING) OIL, Inhale 5 drops into the lungs daily., Disp: , Rfl:    hydrOXYzine (ATARAX) 50 MG tablet, Take 1 tablet (50 mg total) by mouth at bedtime., Disp: 90 tablet, Rfl: 0   lisdexamfetamine (VYVANSE) 50 MG capsule, Take 1 capsule (50 mg total) by mouth daily., Disp: 90 capsule, Rfl: 0   metoprolol succinate (TOPROL-XL) 25 MG 24 hr tablet, Take 1 tablet (25 mg total) by mouth daily., Disp: 30 tablet, Rfl: 3   olmesartan (BENICAR) 20 MG tablet, Take 1 tablet (20 mg total) by mouth daily., Disp: 90 tablet, Rfl: 1   rizatriptan (MAXALT-MLT) 10 MG disintegrating tablet, Take 1 tablet (10 mg total) by mouth as needed for migraine. May repeat in 2 hours if needed, Disp: 10 tablet, Rfl: 0   Semaglutide, 1 MG/DOSE, (OZEMPIC, 1 MG/DOSE,) 4 MG/3ML SOPN, INJECT '1MG'$  UNDER THE SKIN EVERY WEEK, Disp: 9 mL, Rfl: 1   Ubrogepant (UBRELVY) 100 MG TABS, Take 1 tablet by mouth daily as needed., Disp: 9 tablet, Rfl: 1  Allergies  Allergen Reactions   Penicillins Anaphylaxis   Latex Hives and Rash    I personally reviewed active problem list, medication list, allergies, family history, social history, health maintenance with the patient/caregiver today.   ROS  Constitutional: Negative for fever or weight change.  Respiratory: Negative for cough and shortness of breath.   Cardiovascular: Negative for chest pain or palpitations.  Gastrointestinal: Negative for abdominal pain, no bowel changes.   Musculoskeletal: Negative for gait problem or joint swelling.  Skin: Negative for rash.  Neurological: Negative for dizziness or headache.  No other specific complaints in a complete review of systems (except as listed in HPI above).   Objective  Vitals:   04/18/22 0857  BP: 126/74  Pulse: (!) 107  Resp: 16  SpO2: 98%  Weight: 181 lb (82.1 kg)  Height: '5\' 4"'$  (1.626 m)    Body mass index is 31.07 kg/m.  Physical Exam  Constitutional: Patient appears well-developed and well-nourished. Obese  No distress.  HEENT: head atraumatic, normocephalic, pupils equal and reactive to light,, neck supple Cardiovascular: Normal rate, regular rhythm and normal heart sounds.  No murmur heard. No BLE edema. Pulmonary/Chest: Effort normal and breath sounds normal. No respiratory distress. Abdominal: Soft.  There is no tenderness. Psychiatric: Patient has a normal mood and affect. behavior is normal. Judgment and thought content normal.   Recent Results (from the past 2160 hour(s))  POCT HgB A1C     Status: None   Collection Time: 04/18/22  8:58 AM  Result Value Ref Range   Hemoglobin A1C 5.3 4.0 - 5.6 %   HbA1c POC (<> result, manual entry)     HbA1c, POC (prediabetic range)     HbA1c, POC (controlled diabetic range)      PHQ2/9:    04/18/2022    8:58 AM 01/15/2022    9:47 AM 10/12/2021   10:54 AM 09/05/2021    8:28 AM 07/12/2021    9:01 AM  Depression screen PHQ 2/9  Decreased Interest 0 0 0 0 0  Down, Depressed, Hopeless 0 0 0 0 0  PHQ - 2 Score 0 0 0 0 0  Altered sleeping 0 0 0 0 0  Tired, decreased energy 2 0 0 0 0  Change in appetite 0 0 0 0 0  Feeling bad or failure about yourself  0 0 0 0 0  Trouble concentrating 0 0 0 0 0  Moving slowly or fidgety/restless 0 0 0 0 0  Suicidal thoughts 0 0 0 0 0  PHQ-9 Score 2 0 0 0 0  Difficult doing work/chores    Not difficult at all     phq 9 is negative   Fall Risk:    04/18/2022    8:57 AM 01/15/2022    9:47 AM 10/12/2021    10:54 AM 09/05/2021    8:27 AM 07/12/2021    9:00 AM  Fall Risk   Falls in the past year? 0 0 0 0 0  Number falls in past yr: 0 0 0 0 0  Injury with Fall? 0 0 0 0 0  Risk for fall due to : No Fall Risks No Fall Risks No Fall Risks No Fall Risks No Fall Risks  Follow up Falls prevention discussed Falls prevention discussed Falls prevention discussed Falls prevention discussed Falls prevention discussed      Functional Status Survey: Is the patient deaf or have difficulty hearing?: No Does the patient have difficulty seeing, even when wearing glasses/contacts?: No Does the patient have difficulty concentrating, remembering, or making decisions?: No Does the patient have difficulty walking or climbing stairs?: No Does the patient have difficulty dressing or bathing?: No Does the patient have difficulty doing errands alone such as visiting a doctor's office or shopping?: No    Assessment & Plan  1. Hypertension associated with diabetes ([place])  - POCT HgB A1C - Urine Microalbumin w/creat. ratio  2. Controlled type 2 diabetes mellitus without complication, without long-term current use of insulin (HCC)  - atorvastatin (LIPITOR) 20 MG tablet; Take 1 tablet (20 mg total) by mouth daily.  Dispense: 90 tablet; Refill: 1  3. Mixed hyperlipidemia  - atorvastatin (LIPITOR) 20 MG tablet; Take 1 tablet (20 mg total) by mouth daily.  Dispense: 90 tablet; Refill: 1  4. Major depression in remission (HCC)  - hydrOXYzine (ATARAX) 50 MG tablet; Take 1 tablet (50 mg total) by mouth at bedtime.  Dispense: 90 tablet; Refill: 1  5. GAD (generalized anxiety disorder)  - hydrOXYzine (ATARAX) 50 MG tablet; Take 1 tablet (50 mg total) by mouth at bedtime.  Dispense: 90 tablet; Refill: 1  6. ADHD (attention deficit hyperactivity disorder), combined type  - lisdexamfetamine (VYVANSE) 50 MG capsule; Take 1 capsule (50 mg total) by mouth daily.  Dispense: 90 capsule; Refill: 0

## 2022-04-18 ENCOUNTER — Encounter: Payer: Self-pay | Admitting: Family Medicine

## 2022-04-18 ENCOUNTER — Ambulatory Visit: Payer: Self-pay | Admitting: Family Medicine

## 2022-04-18 VITALS — BP 126/74 | HR 107 | Resp 16 | Ht 64.0 in | Wt 181.0 lb

## 2022-04-18 DIAGNOSIS — F411 Generalized anxiety disorder: Secondary | ICD-10-CM

## 2022-04-18 DIAGNOSIS — F325 Major depressive disorder, single episode, in full remission: Secondary | ICD-10-CM

## 2022-04-18 DIAGNOSIS — I152 Hypertension secondary to endocrine disorders: Secondary | ICD-10-CM

## 2022-04-18 DIAGNOSIS — F902 Attention-deficit hyperactivity disorder, combined type: Secondary | ICD-10-CM

## 2022-04-18 DIAGNOSIS — E1159 Type 2 diabetes mellitus with other circulatory complications: Secondary | ICD-10-CM

## 2022-04-18 DIAGNOSIS — E119 Type 2 diabetes mellitus without complications: Secondary | ICD-10-CM

## 2022-04-18 DIAGNOSIS — E782 Mixed hyperlipidemia: Secondary | ICD-10-CM

## 2022-04-18 LAB — POCT GLYCOSYLATED HEMOGLOBIN (HGB A1C): Hemoglobin A1C: 5.3 % (ref 4.0–5.6)

## 2022-04-18 MED ORDER — LISDEXAMFETAMINE DIMESYLATE 50 MG PO CAPS: 50.0000 mg | ORAL_CAPSULE | Freq: Every day | ORAL | 0 refills | Status: DC

## 2022-04-18 MED ORDER — HYDROXYZINE HCL 50 MG PO TABS: 50.0000 mg | ORAL_TABLET | Freq: Every day | ORAL | 1 refills | Status: DC

## 2022-04-18 MED ORDER — METOPROLOL SUCCINATE ER 25 MG PO TB24: 25.0000 mg | ORAL_TABLET | Freq: Every day | ORAL | 1 refills | Status: DC

## 2022-04-18 MED ORDER — ATORVASTATIN CALCIUM 20 MG PO TABS: 20.0000 mg | ORAL_TABLET | Freq: Every day | ORAL | 1 refills | Status: DC

## 2022-04-19 LAB — MICROALBUMIN / CREATININE URINE RATIO
Creatinine, Urine: 175 mg/dL (ref 20–275)
Microalb Creat Ratio: 3 mcg/mg creat (ref <30)
Microalb, Ur: 0.6 mg/dL

## 2022-05-09 LAB — HM DIABETES EYE EXAM

## 2022-05-27 ENCOUNTER — Other Ambulatory Visit: Payer: Self-pay | Admitting: Family Medicine

## 2022-05-27 DIAGNOSIS — I152 Hypertension secondary to endocrine disorders: Secondary | ICD-10-CM

## 2022-05-27 DIAGNOSIS — E1169 Type 2 diabetes mellitus with other specified complication: Secondary | ICD-10-CM

## 2022-07-14 ENCOUNTER — Other Ambulatory Visit: Payer: Self-pay | Admitting: Family Medicine

## 2022-07-14 DIAGNOSIS — E1169 Type 2 diabetes mellitus with other specified complication: Secondary | ICD-10-CM

## 2022-07-14 DIAGNOSIS — I152 Hypertension secondary to endocrine disorders: Secondary | ICD-10-CM

## 2022-07-14 DIAGNOSIS — E1159 Type 2 diabetes mellitus with other circulatory complications: Secondary | ICD-10-CM

## 2022-07-15 ENCOUNTER — Encounter: Payer: Self-pay | Admitting: Family Medicine

## 2022-07-15 ENCOUNTER — Other Ambulatory Visit: Payer: Self-pay | Admitting: Family Medicine

## 2022-07-15 DIAGNOSIS — I10 Essential (primary) hypertension: Secondary | ICD-10-CM

## 2022-07-18 NOTE — Progress Notes (Unsigned)
Name: Melanie Villarreal   MRN: [id number]    DOB: 11-Apr-1976   Date:07/19/2022       Progress Note  Subjective  Chief Complaint  Follow Up  HPI  DMII:  she had gestational diabetes with her middle child, it resolved after delivered in 2005 , she was diagnosed with type II DM in 2017 she has associated hypertension, dyslipidemia, obese, she also has neuropahty. She has been on Ozempic since May 2020, when A1C was 7.2 % , since she has been on medication her A1C has been well controlled and today it was 5.9 %   She denies polyphagia, polydipsia or polyuria .   She is on Atorvastatin for dyslipidemia and denies side effects.  Weakness: episodes are random, started in her 45's  and was having recurrent symptoms earlier in 2023 she went to cardiologist and has a Zio monitor in place.  She was seen for similar symptoms in 2019 and had an unremarkable echo, also negative cardiac calcium score and normal ZIO  monitor . She states she is still concerned due to significant family history of heart disease, specially mother that had stent She states episodes resolved since last visit She states symptoms have resolved since her son joined the [employer] in October   Dyslipidemia: Last LDL was up from 52 to 92 and last visit it was 9  , last triglycerides was 166 She is compliant with Atorvastatin and denies side effects . Recheck labs next visit    HTN: bp is at goal today, she is back on Benicar 20 mg dose and also  metoprolol, heart rate is at goal. Continue current dose . BP at local pharmacy was 66/72, today is at goal    Obesity : she has been obese since age 61. She states worse since the last pregnancy. Her max weight was 262 lbs around 2016  Started diet plus Ozempic in May 1st  2020 when her weight was 235 lbs and weight has been stable now at 181 lbs    ADHD: she states symptoms started in childhood, but was treated at age 89 , she has tried Adderal - caused anger outburst, felt tired at the end of the day,  Strattera caused sedation, Ritalin caused palpitation. She has been on Vyvanse for the past 5 years and is working well for her. No side effects of medication, except is helps a little on her appetite She states her boss notices when she does not take the medication. She states her husband and children also noticed right away when she skips the dose. She has been getting 90 day supply of 50 mg dose. Reminded her it is a controlled medication. Continue current dose    MDD: she was diagnosed by a psychiatrist when she was 47 yo with cyclothymia. Her parents were going through a divorce, mother left and stayed with her step-father, she has one brother that died as an infant and another one that died from drug overdose at age 24 ( she was  47 ). There is a family history of depression. She was on Zoloft from age 5 until year 2019 when she was switched from Zoloft to [place] because she had a relapse. ( she also states while on zoloft tried some other SSRI - not sure of the name).  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 She stopped seeing psychiatrist because of turn over of providers. She stopped taking  Viibrid on May 13 th 22 because she forgot to take medication on vacation and felt fine without it, she is doing well on Hydroxizine prn, states Vyvanse also helps with her mood . She is doing some cranial sacral therapy and has been helpful for her emotions also    Migraine headaches: she states seldom has episodes, she states she has a tingling sensation on her scalp followed by a headache, it is described as a "zap" sensation . She has phonophobia and photophobia. She does not like taking Maxalt because of side effects and has some Ubrelvy at the pharmacy but has not started it yet. She only had one episode in 2023   Thyroid nodule: large and palpable on right side. She was going to see Dr. Buddy Duty but not in her plan, she is waiting for  another insurance to kick in , she will [facility]   Atypical moles: she had insurance changed and is waiting to see a new dermatologist , she has not been yet.   Patient Active Problem List   Diagnosis Date Noted   Hypertension associated with diabetes ([place]) 01/15/2022   Major depression in remission ([place]) 01/15/2022   Palpitations 10/05/2021   Hypertension 10/05/2021   Obesity (BMI 30-39.9) 10/05/2021   Hypertension associated with type 2 diabetes mellitus ([place]) 12/09/2019   Hyperlipidemia associated with type 2 diabetes mellitus ([place]) 07/02/2019   Urge incontinence 08/15/2018   Headache disorder 06/17/2018   Family history of premature CAD 09/11/2017   Thyroid nodule 08/15/2017   LVH (left ventricular hypertrophy) 02/01/2017   Sinus tachycardia 02/24/2016   Valvular regurgitation 01/11/2016   ADHD (attention deficit hyperactivity disorder), combined type 08/30/2015   GAD (generalized anxiety disorder) 12/31/2014   Major depressive disorder, recurrent episode ([place]) 10/25/2014   Essential hypertension 10/25/2014    Past Surgical History:  Procedure Laterality Date   CESAREAN SECTION     ENDOMETRIAL ABLATION W/ NOVASURE  06/2008   estimate   thyroid nodule biopsy     TUBAL LIGATION      Family History  Problem Relation Age of Onset   Heart disease Mother    Hyperlipidemia Mother    Hypertension Mother    Miscarriages / [place] Mother        2   Anxiety disorder Mother    Coronary artery disease Mother 66       11 stents   Alcohol abuse Father    Cancer Father        testicular   Depression Father    Heart disease Sister    Hyperlipidemia Sister    Hypertension Sister    Other Sister         Langerhans Cell Histiocytosis   Drug abuse Brother    Alcohol abuse Brother    Depression Brother    ADD / ADHD Son    Mental illness Maternal Aunt    Alcohol abuse Maternal Grandfather    Depression Maternal Grandfather    Anxiety disorder Maternal  Grandfather    Cancer Paternal Grandmother    Breast cancer Paternal Grandmother        late 70's   Alcohol abuse Paternal Grandfather     Social History   Tobacco Use   Smoking status: Former    Packs/day: 0.50    Types: Cigarettes, E-cigarettes    Start date: 09/14/1995    Quit date: 01/10/2021    Years since quitting: 1.5   Smokeless tobacco: Never   Tobacco comments:  On an off over the years. She smoked for 12 years solid, otherwise on and off   Substance Use Topics   Alcohol use: Yes    Alcohol/week: 0.0 standard drinks of alcohol    Comment: rare     Current Outpatient Medications:    atorvastatin (LIPITOR) 20 MG tablet, Take 1 tablet (20 mg total) by mouth daily., Disp: 90 tablet, Rfl: 1   Homeopathic Products (FRANKINCENSE UPLIFTING) OIL, Inhale 5 drops into the lungs daily., Disp: , Rfl:    hydrOXYzine (ATARAX) 50 MG tablet, Take 1 tablet (50 mg total) by mouth at bedtime., Disp: 90 tablet, Rfl: 1   metoprolol succinate (TOPROL-XL) 25 MG 24 hr tablet, Take 1 tablet (25 mg total) by mouth daily., Disp: 90 tablet, Rfl: 1   olmesartan (BENICAR) 20 MG tablet, TAKE ONE TABLET BY MOUTH ONE TIME DAILY, Disp: 90 tablet, Rfl: 0   rizatriptan (MAXALT-MLT) 10 MG disintegrating tablet, Take 1 tablet (10 mg total) by mouth as needed for migraine. May repeat in 2 hours if needed, Disp: 10 tablet, Rfl: 0   Ubrogepant (UBRELVY) 100 MG TABS, Take 1 tablet by mouth daily as needed., Disp: 9 tablet, Rfl: 1   lisdexamfetamine (VYVANSE) 50 MG capsule, Take 1 capsule (50 mg total) by mouth daily., Disp: 90 capsule, Rfl: 0   Semaglutide, 1 MG/DOSE, (OZEMPIC, 1 MG/DOSE,) 4 MG/3ML SOPN, Inject 1 mg into the skin once a week., Disp: 9 mL, Rfl: 1  Allergies  Allergen Reactions   Penicillins Anaphylaxis   Latex Hives and Rash    I personally reviewed active problem list, medication list, allergies, family history, social history, health maintenance with the patient/caregiver  today.   ROS  Constitutional: Negative for fever or weight change.  Respiratory: Negative for cough and shortness of breath.   Cardiovascular: Negative for chest pain or palpitations.  Gastrointestinal: Negative for abdominal pain, no bowel changes.  Musculoskeletal: Negative for gait problem or joint swelling.  Skin: Negative for rash.  Neurological: Negative for dizziness or headache.  No other specific complaints in a complete review of systems (except as listed in HPI above).   Objective   Vitals:   07/19/22 0835  BP: 124/70  Pulse: 94  Resp: 16  SpO2: 99%  Weight: 181 lb (82.1 kg)  Height: '5\' 4"'$  (1.626 m)    Body mass index is 31.07 kg/m.  Physical Exam  Constitutional: Patient appears well-developed and well-nourished. Obese  No distress.  HEENT: head atraumatic, normocephalic, pupils equal and reactive to light, neck supple Cardiovascular: Normal rate, regular rhythm and normal heart sounds.  No murmur heard. No BLE edema. Pulmonary/Chest: Effort normal and breath sounds normal. No respiratory distress. Abdominal: Soft.  There is no tenderness. Psychiatric: Patient has a normal mood and affect. behavior is normal. Judgment and thought content normal.   Recent Results (from the past 2160 hour(s))  HM DIABETES EYE EXAM     Status: None   Collection Time: 05/09/22 12:00 AM  Result Value Ref Range   HM Diabetic Eye Exam No Retinopathy No Retinopathy  POCT HgB A1C     Status: Abnormal   Collection Time: 07/19/22  7:50 AM  Result Value Ref Range   Hemoglobin A1C 5.9 (A) 4.0 - 5.6 %   HbA1c POC (<> result, manual entry)     HbA1c, POC (prediabetic range)     HbA1c, POC (controlled diabetic range)      PHQ2/9:    07/19/2022    7:50 AM 04/18/2022  8:58 AM 01/15/2022    9:47 AM 10/12/2021   10:54 AM 09/05/2021    8:28 AM  Depression screen PHQ 2/9  Decreased Interest 0 0 0 0 0  Down, Depressed, Hopeless 0 0 0 0 0  PHQ - 2 Score 0 0 0 0 0  Altered sleeping 0 0  0 0 0  Tired, decreased energy 0 2 0 0 0  Change in appetite 0 0 0 0 0  Feeling bad or failure about yourself  0 0 0 0 0  Trouble concentrating 0 0 0 0 0  Moving slowly or fidgety/restless 0 0 0 0 0  Suicidal thoughts 0 0 0 0 0  PHQ-9 Score 0 2 0 0 0  Difficult doing work/chores     Not difficult at all    phq 9 is negative   Fall Risk:    07/19/2022    7:49 AM 04/18/2022    8:57 AM 01/15/2022    9:47 AM 10/12/2021   10:54 AM 09/05/2021    8:27 AM  Fall Risk   Falls in the past year? 0 0 0 0 0  Number falls in past yr: 0 0 0 0 0  Injury with Fall? 0 0 0 0 0  Risk for fall due to : No Fall Risks No Fall Risks No Fall Risks No Fall Risks No Fall Risks  Follow up Falls prevention discussed Falls prevention discussed Falls prevention discussed Falls prevention discussed Falls prevention discussed      Functional Status Survey: Is the patient deaf or have difficulty hearing?: No Does the patient have difficulty seeing, even when wearing glasses/contacts?: No Does the patient have difficulty concentrating, remembering, or making decisions?: No Does the patient have difficulty walking or climbing stairs?: No Does the patient have difficulty dressing or bathing?: No Does the patient have difficulty doing errands alone such as visiting a doctor's office or shopping?: No    Assessment & Plan  1. Hypertension associated with diabetes ([place])  - POCT HgB A1C - Semaglutide, 1 MG/DOSE, (OZEMPIC, 1 MG/DOSE,) 4 MG/3ML SOPN; Inject 1 mg into the skin once a week.  Dispense: 9 mL; Refill: 1  2. Hyperlipidemia associated with type 2 diabetes mellitus (HCC)  - Semaglutide, 1 MG/DOSE, (OZEMPIC, 1 MG/DOSE,) 4 MG/3ML SOPN; Inject 1 mg into the skin once a week.  Dispense: 9 mL; Refill: 1  3. ADHD (attention deficit hyperactivity disorder), combined type  - lisdexamfetamine (VYVANSE) 50 MG capsule; Take 1 capsule (50 mg total) by mouth daily.  Dispense: 90 capsule; Refill: 0  4. Major  depression in remission ([place])   5. GAD (generalized anxiety disorder)   6. Essential hypertension   7. Thyroid nodule   8. Migraine with aura and without status migrainosus, not intractable

## 2022-07-19 ENCOUNTER — Encounter: Payer: Self-pay | Admitting: Family Medicine

## 2022-07-19 ENCOUNTER — Ambulatory Visit (INDEPENDENT_AMBULATORY_CARE_PROVIDER_SITE_OTHER): Payer: Self-pay | Admitting: Family Medicine

## 2022-07-19 VITALS — BP 124/70 | HR 94 | Resp 16 | Ht 64.0 in | Wt 181.0 lb

## 2022-07-19 DIAGNOSIS — E785 Hyperlipidemia, unspecified: Secondary | ICD-10-CM

## 2022-07-19 DIAGNOSIS — E1159 Type 2 diabetes mellitus with other circulatory complications: Secondary | ICD-10-CM

## 2022-07-19 DIAGNOSIS — F902 Attention-deficit hyperactivity disorder, combined type: Secondary | ICD-10-CM

## 2022-07-19 DIAGNOSIS — I152 Hypertension secondary to endocrine disorders: Secondary | ICD-10-CM

## 2022-07-19 DIAGNOSIS — E1169 Type 2 diabetes mellitus with other specified complication: Secondary | ICD-10-CM

## 2022-07-19 DIAGNOSIS — E041 Nontoxic single thyroid nodule: Secondary | ICD-10-CM

## 2022-07-19 DIAGNOSIS — F325 Major depressive disorder, single episode, in full remission: Secondary | ICD-10-CM

## 2022-07-19 DIAGNOSIS — F411 Generalized anxiety disorder: Secondary | ICD-10-CM

## 2022-07-19 DIAGNOSIS — G43109 Migraine with aura, not intractable, without status migrainosus: Secondary | ICD-10-CM

## 2022-07-19 DIAGNOSIS — I10 Essential (primary) hypertension: Secondary | ICD-10-CM

## 2022-07-19 DIAGNOSIS — E119 Type 2 diabetes mellitus without complications: Secondary | ICD-10-CM

## 2022-07-19 LAB — POCT GLYCOSYLATED HEMOGLOBIN (HGB A1C): Hemoglobin A1C: 5.9 % — AB (ref 4.0–5.6)

## 2022-07-19 MED ORDER — LISDEXAMFETAMINE DIMESYLATE 50 MG PO CAPS: 50.0000 mg | ORAL_CAPSULE | Freq: Every day | ORAL | 0 refills | Status: DC

## 2022-07-19 MED ORDER — OZEMPIC (1 MG/DOSE) 4 MG/3ML ~~LOC~~ SOPN: 1.0000 mg | PEN_INJECTOR | SUBCUTANEOUS | 1 refills | Status: DC

## 2022-07-25 ENCOUNTER — Other Ambulatory Visit: Payer: Self-pay | Admitting: Family Medicine

## 2022-07-25 DIAGNOSIS — E782 Mixed hyperlipidemia: Secondary | ICD-10-CM

## 2022-07-25 DIAGNOSIS — F411 Generalized anxiety disorder: Secondary | ICD-10-CM

## 2022-07-25 DIAGNOSIS — F325 Major depressive disorder, single episode, in full remission: Secondary | ICD-10-CM

## 2022-07-25 DIAGNOSIS — E119 Type 2 diabetes mellitus without complications: Secondary | ICD-10-CM

## 2022-10-22 ENCOUNTER — Other Ambulatory Visit: Payer: Self-pay | Admitting: Family Medicine

## 2022-10-22 DIAGNOSIS — E782 Mixed hyperlipidemia: Secondary | ICD-10-CM

## 2022-10-22 DIAGNOSIS — F325 Major depressive disorder, single episode, in full remission: Secondary | ICD-10-CM

## 2022-10-22 DIAGNOSIS — E119 Type 2 diabetes mellitus without complications: Secondary | ICD-10-CM

## 2022-10-22 DIAGNOSIS — F411 Generalized anxiety disorder: Secondary | ICD-10-CM

## 2022-10-23 NOTE — Progress Notes (Signed)
Name: Melanie Villarreal   MRN: [id number]    DOB: [birth year]   Date:10/24/2022       Progress Note  Subjective  Chief Complaint  Follow Up  HPI  DMII:  she had gestational diabetes with her middle child, it resolved after delivered in 2005 , she was diagnosed with type II DM in 2017 she has associated hypertension, dyslipidemia, obese, she also has neuropahty. She has been on Ozempic since May 2020, when A1C was 7.2 % , since she has been on medication her A1C has been well controlled and today it is 5.7 %, she is however without insurance now and last dose was a couple of months ago. She is going to be more compliant with her diet   She denies polyphagia, polydipsia or polyuria .   She is on Atorvastatin for dyslipidemia and denies side effects.  Weakness: episodes are random, started in her 20's  and was having recurrent symptoms earlier in 2023 she went to cardiologist and has a Zio monitor in place.  She was seen for similar symptoms in 2019 and had an unremarkable echo, also negative cardiac calcium score and normal ZIO  monitor . She states she is still concerned due to significant family history of heart disease, specially mother that had stent She states episodes resolved since last visit She states symptoms have resolved since her son joined the [employer] in October 2023  Dyslipidemia: Last LDL was up from 52 to 92 and last visit it was 57  , last triglycerides was 166 She is compliant with Atorvastatin and denies side effects .We will recheck labs    HTN: bp is at goal today, she is back on Benicar 20 mg dose and also  metoprolol, heart rate is at goal. Continue current dose . BP is at goal   Obesity : she has been obese since age 47. She states worse since the last pregnancy. Her max weight was 262 lbs around 2016  Started diet plus Ozempic in May 1st  2020 when her weight was 235 lbs and weight has been stable now between 181 -184 lbs    ADHD: she states symptoms started in childhood, but was  treated at age 47 , she has tried Adderal - caused anger outburst, felt tired at the end of the day, Strattera caused sedation, Ritalin caused palpitation. She has been on Vyvanse for the past 5 years and is working well for her. No side effects of medication, except is helps a little on her appetite She states her boss notices when she does not take the medication. She states her husband and children also noticed right away when she skips the dose. 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 Her parents were going through a divorce, mother left and stayed with her step-father, she has one brother that died as an infant and another one that died from drug overdose at age 59 ( she was  47 ). There is a family history of depression. She was on Zoloft from age 89 until year 2019 when she was switched from Zoloft to Viibrid because she had a relapse. ( she also states while on zoloft tried some other SSRI - not sure of the name).  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 She stopped seeing psychiatrist because of turn over of providers. She stopped taking Viibrid on May 13 th 22 because she forgot to take medication on vacation and felt fine without it, she is doing well on Hydroxizine prn, states Vyvanse also helps with her mood . She is doing some cranial sacral therapy and is also doing a [place] bowel and is really helping her    Migraine headaches: she states seldom has episodes, she states she has a tingling sensation on her scalp followed by a headache, it is described as a "zap" sensation . She has phonophobia and photophobia. She does not like taking Maxalt because of side effects and has some Ubrelvy at the pharmacy but has not started it yet. She only had  one episode in 2023 Unchanged   Thyroid nodule: large and palpable on right side. 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 Unchanged   Patient Active Problem List   Diagnosis Date Noted   Hypertension associated with diabetes 01/15/2022   Major depression in remission 01/15/2022   Palpitations 10/05/2021   Hypertension 10/05/2021   Obesity (BMI 30-39.9) 10/05/2021   Hypertension associated with type 2 diabetes mellitus 12/09/2019   Hyperlipidemia associated with type 2 diabetes mellitus 07/02/2019   Urge incontinence 08/15/2018   Headache disorder 06/17/2018   Family history of premature CAD 09/11/2017   Thyroid nodule 08/15/2017   LVH (left ventricular hypertrophy) 02/01/2017   Sinus tachycardia 02/24/2016   Valvular regurgitation 01/11/2016   ADHD (attention deficit hyperactivity disorder), combined type 08/30/2015   GAD (generalized anxiety disorder) 12/31/2014   Major depressive disorder, recurrent episode (H[facility]) 10/25/2014   Essential hypertension 10/25/2014    Past Surgical History:  Procedure Laterality Date   CESAREAN SECTION     ENDOMETRIAL ABLATION W/ NOVASURE  06/2008   estimate   thyroid nodule biopsy     TUBAL LIGATION      Family History  Problem Relation Age of Onset   Heart disease Mother    Hyperlipidemia Mother    Hypertension Mother    Miscarriages / [place] Mother        2   Anxiety disorder Mother    Coronary artery disease Mother 55       11 stents   Alcohol abuse Father    Cancer Father        testicular   Depression Father    Heart disease Sister    Hyperlipidemia Sister    Hypertension Sister    Other Sister         Langerhans Cell Histiocytosis   Drug abuse Brother    Alcohol abuse Brother    Depression Brother    ADD / ADHD Son    Mental illness Maternal Aunt    Alcohol abuse Maternal Grandfather     Depression Maternal Grandfather    Anxiety disorder Maternal Grandfather    Cancer Paternal Grandmother    Breast cancer Paternal Grandmother        late 70's   Alcohol abuse Paternal Grandfather     Social History   Tobacco Use   Smoking status: Former    Packs/day: .5    Types: Cigarettes, E-cigarettes    Start date: 09/14/1995    Quit date: 01/10/2021    Years since  quitting: 1.7   Smokeless tobacco: Never   Tobacco comments:    On an off over the years. She smoked for 12 years solid, otherwise on and off   Substance Use Topics   Alcohol use: Yes    Alcohol/week: 0.0 standard drinks of alcohol    Comment: rare     Current Outpatient Medications:    atorvastatin (LIPITOR) 20 MG tablet, Take 1 tablet (20 mg total) by mouth daily., Disp: 90 tablet, Rfl: 1   Homeopathic Products (FRANKINCENSE UPLIFTING) OIL, Inhale 5 drops into the lungs daily., Disp: , Rfl:    hydrOXYzine (ATARAX) 50 MG tablet, Take 1 tablet (50 mg total) by mouth at bedtime., Disp: 90 tablet, Rfl: 1   lisdexamfetamine (VYVANSE) 50 MG capsule, Take 1 capsule (50 mg total) by mouth daily., Disp: 90 capsule, Rfl: 0   metoprolol succinate (TOPROL-XL) 25 MG 24 hr tablet, Take 1 tablet (25 mg total) by mouth daily., Disp: 90 tablet, Rfl: 1   olmesartan (BENICAR) 20 MG tablet, TAKE ONE TABLET BY MOUTH ONE TIME DAILY, Disp: 90 tablet, Rfl: 0   rizatriptan (MAXALT-MLT) 10 MG disintegrating tablet, Take 1 tablet (10 mg total) by mouth as needed for migraine. May repeat in 2 hours if needed, Disp: 10 tablet, Rfl: 0   Semaglutide, 1 MG/DOSE, (OZEMPIC, 1 MG/DOSE,) 4 MG/3ML SOPN, Inject 1 mg into the skin once a week., Disp: 9 mL, Rfl: 1   Ubrogepant (UBRELVY) 100 MG TABS, Take 1 tablet by mouth daily as needed. (Patient not taking: Reported on 10/24/2022), Disp: 9 tablet, Rfl: 1  Allergies  Allergen Reactions   Penicillins Anaphylaxis   Latex Hives and Rash    I personally reviewed active problem list, medication list,  allergies, family history, social history, health maintenance with the patient/caregiver today.   ROS  Constitutional: Negative for fever or weight change.  Respiratory: Negative for cough and shortness of breath.   Cardiovascular: Negative for chest pain or palpitations.  Gastrointestinal: Negative for abdominal pain, no bowel changes.  Musculoskeletal: Negative for gait problem or joint swelling.  Skin: Negative for rash.  Neurological: Negative for dizziness or headache.  No other specific complaints in a complete review of systems (except as listed in HPI above).   Objective  Vitals:   10/24/22 1011  BP: 124/82  Pulse: 95  Resp: 16  SpO2: 99%  Weight: 184 lb (83.5 kg)  Height: 5\' 4"  (1.626 m)    Body mass index is 31.58 kg/m.  Physical Exam  Constitutional: Patient appears well-developed and well-nourished. Obese  No distress.  HEENT: head atraumatic, normocephalic, pupils equal and reactive to light, neck supple, throat within normal limits thyromegaly  Cardiovascular: Normal rate, regular rhythm and normal heart sounds.  No murmur heard. No BLE edema. Pulmonary/Chest: Effort normal and breath sounds normal. No respiratory distress. Abdominal: Soft.  There is no tenderness. Psychiatric: Patient has a normal mood and affect. behavior is normal. Judgment and thought content normal.   Recent Results (from the past 2160 hour(s))  POCT HgB A1C     Status: Abnormal   Collection Time: 10/24/22 10:12 AM  Result Value Ref Range   Hemoglobin A1C 5.7 (A) 4.0 - 5.6 %   HbA1c POC (<> result, manual entry)     HbA1c, POC (prediabetic range)     HbA1c, POC (controlled diabetic range)       PHQ2/9:    10/24/2022   10:11 AM 07/19/2022    7:50 AM 04/18/2022  8:58 AM 01/15/2022    9:47 AM 10/12/2021   10:54 AM  Depression screen PHQ 2/9  Decreased Interest 0 0 0 0 0  Down, Depressed, Hopeless 0 0 0 0 0  PHQ - 2 Score 0 0 0 0 0  Altered sleeping 0 0 0 0 0  Tired, decreased  energy 0 0 2 0 0  Change in appetite 0 0 0 0 0  Feeling bad or failure about yourself  0 0 0 0 0  Trouble concentrating 0 0 0 0 0  Moving slowly or fidgety/restless 0 0 0 0 0  Suicidal thoughts 0 0 0 0 0  PHQ-9 Score 0 0 2 0 0    phq 9 is negative   Fall Risk:    10/24/2022   10:11 AM 07/19/2022    7:49 AM 04/18/2022    8:57 AM 01/15/2022    9:47 AM 10/12/2021   10:54 AM  Fall Risk   Falls in the past year? 0 0 0 0 0  Number falls in past yr: 0 0 0 0 0  Injury with Fall? 0 0 0 0 0  Risk for fall due to : No Fall Risks No Fall Risks No Fall Risks No Fall Risks No Fall Risks  Follow up Falls prevention discussed Falls prevention discussed Falls prevention discussed Falls prevention discussed Falls prevention discussed      Functional Status Survey: Is the patient deaf or have difficulty hearing?: No Does the patient have difficulty seeing, even when wearing glasses/contacts?: No Does the patient have difficulty concentrating, remembering, or making decisions?: Yes Does the patient have difficulty walking or climbing stairs?: No Does the patient have difficulty dressing or bathing?: No Does the patient have difficulty doing errands alone such as visiting a doctor's office or shopping?: No    Assessment & Plan  1. Hyperlipidemia associated with type 2 diabetes mellitus  Currently not on medication. Cannot tolerate metformin, Ozempic too costly we will monitor for now  - POCT HgB A1C - COMPLETE METABOLIC PANEL WITH GFR  2. ADHD (attention deficit hyperactivity disorder), combined type  - lisdexamfetamine (VYVANSE) 50 MG capsule; Take 1 capsule (50 mg total) by mouth daily.  Dispense: 90 capsule; Refill: 0  3. Hypertension associated with diabetes   4. Mixed hyperlipidemia  - atorvastatin (LIPITOR) 20 MG tablet; Take 1 tablet (20 mg total) by mouth daily.  Dispense: 90 tablet; Refill: 1 - Lipid panel  5. Essential hypertension  - metoprolol succinate (TOPROL-XL) 25 MG  24 hr tablet; Take 1 tablet (25 mg total) by mouth daily.  Dispense: 90 tablet; Refill: 1 - olmesartan (BENICAR) 20 MG tablet; Take 1 tablet (20 mg total) by mouth daily.  Dispense: 90 tablet; Refill: 1  6. Major depression in remission  Stable   7. GAD (generalized anxiety disorder)   8. Migraine with aura and without status migrainosus, not intractable

## 2022-10-24 ENCOUNTER — Encounter: Payer: Self-pay | Admitting: Family Medicine

## 2022-10-24 ENCOUNTER — Ambulatory Visit (INDEPENDENT_AMBULATORY_CARE_PROVIDER_SITE_OTHER): Payer: Self-pay | Admitting: Family Medicine

## 2022-10-24 VITALS — BP 124/82 | HR 95 | Resp 16 | Ht 64.0 in | Wt 184.0 lb

## 2022-10-24 DIAGNOSIS — G43109 Migraine with aura, not intractable, without status migrainosus: Secondary | ICD-10-CM

## 2022-10-24 DIAGNOSIS — E785 Hyperlipidemia, unspecified: Secondary | ICD-10-CM

## 2022-10-24 DIAGNOSIS — F902 Attention-deficit hyperactivity disorder, combined type: Secondary | ICD-10-CM

## 2022-10-24 DIAGNOSIS — I152 Hypertension secondary to endocrine disorders: Secondary | ICD-10-CM

## 2022-10-24 DIAGNOSIS — E1159 Type 2 diabetes mellitus with other circulatory complications: Secondary | ICD-10-CM

## 2022-10-24 DIAGNOSIS — F411 Generalized anxiety disorder: Secondary | ICD-10-CM

## 2022-10-24 DIAGNOSIS — E1169 Type 2 diabetes mellitus with other specified complication: Secondary | ICD-10-CM

## 2022-10-24 DIAGNOSIS — F325 Major depressive disorder, single episode, in full remission: Secondary | ICD-10-CM

## 2022-10-24 DIAGNOSIS — I10 Essential (primary) hypertension: Secondary | ICD-10-CM

## 2022-10-24 DIAGNOSIS — E782 Mixed hyperlipidemia: Secondary | ICD-10-CM

## 2022-10-24 LAB — POCT GLYCOSYLATED HEMOGLOBIN (HGB A1C): Hemoglobin A1C: 5.7 % — AB (ref 4.0–5.6)

## 2022-10-24 MED ORDER — METOPROLOL SUCCINATE ER 25 MG PO TB24: 25.0000 mg | ORAL_TABLET | Freq: Every day | ORAL | 1 refills | Status: DC

## 2022-10-24 MED ORDER — LISDEXAMFETAMINE DIMESYLATE 50 MG PO CAPS: 50.0000 mg | ORAL_CAPSULE | Freq: Every day | ORAL | 0 refills | Status: DC

## 2022-10-24 MED ORDER — OLMESARTAN MEDOXOMIL 20 MG PO TABS: 20.0000 mg | ORAL_TABLET | Freq: Every day | ORAL | 1 refills | Status: DC

## 2022-10-24 MED ORDER — ATORVASTATIN CALCIUM 20 MG PO TABS: 20.0000 mg | ORAL_TABLET | Freq: Every day | ORAL | 1 refills | Status: DC

## 2023-01-10 NOTE — Progress Notes (Signed)
Name: Melanie Villarreal   MRN: [id number]    DOB: 11-04-1975   Date:01/11/2023       Progress Note  Subjective  Chief Complaint  Medication Refill  HPI  DMII:  she had gestational diabetes with her middle child, it resolved after delivered in 2005 , she was diagnosed with type II DM in 2017 she has associated hypertension, dyslipidemia, obese, she also has neuropahty. She was  on Ozempic since May 2020, when A1C was 7.2 % , since she has been on medication her A1C has been well controlled A1C was 5.7 %. She is off Ozempic due to cost, no insurance at this time, but has been compliant with Benicar and Atorvastatin   Dyslipidemia: Last LDL was up from 52 to 92 and last visit it was 57  , last triglycerides was 166 She is compliant with Atorvastatin and denies side effects  She will have labs done today    HTN: bp is towards low end of normal, she is taking  Benicar 20 mg dose and also  metoprolol, heart rate is at goal.   Obesity : she has been obese since age 73. She states worse since the last pregnancy. Her max weight was 262 lbs around 2016  Started diet plus Ozempic in May 1st  2020 when her weight was 235 lbs and weight was stable  181 -184 lbs however due to lack of insurance she has been off Ozempic since January 2024 , she is eating more but trying to have healthy snacks but weight today is up to 193.1 lbs.    ADHD: she states symptoms started in childhood, but was treated at age 67 , she has tried Adderal - caused anger outburst, felt tired at the end of the day, Strattera caused sedation, Ritalin caused palpitation. She has been on Vyvanse for the past 5 years and is working well for her. No side effects of medication, except is helps a little on her appetite She states her boss notices when she does not take the medication. She states her husband and children also noticed right away when she skips the dose. 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 Her parents were going through a divorce, mother left and stayed with her step-father, she has one brother that died as an infant and another one that died from drug overdose at age 62 ( she was  61 ). There is a family history of depression. She was on Zoloft from age 27 until year 2019 when she was switched from Zoloft to Viibrid because she had a relapse. ( she also states while on zoloft tried some other SSRI - not sure of the name).  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 She stopped seeing psychiatrist because of turn over of providers. She stopped taking Viibrid on May 13 th 22 because she forgot to take medication on vacation and felt fine without it, she is doing well on Hydroxizine prn, states Vyvanse also helps with her mood . She stopped  doing some cranial sacral therapy. She is doing daily devotional and bible studies, exercising three nights a week.    Migraine headaches: she states seldom has episodes, she  states she has a tingling sensation on her scalp followed by a headache, it is described as a "zap" sensation . She has phonophobia and photophobia. She does not like taking Maxalt because of side effects and has some Ubrelvy at the pharmacy but has not started it yet. She only had one episode in 2023 Still asymptomatic   Thyroid nodule: large and palpable on right side. She states does not having insurance now and has not seen Endo . Unchanged    Patient Active Problem List   Diagnosis Date Noted   Hypertension associated with diabetes (HCC) 01/15/2022   Major depression in remission (HCC) 01/15/2022   Palpitations 10/05/2021   Hypertension 10/05/2021   Obesity (BMI 30-39.9) 10/05/2021   Hypertension associated with type 2 diabetes mellitus (HCC)  12/09/2019   Hyperlipidemia associated with type 2 diabetes mellitus (HCC) 07/02/2019   Urge incontinence 08/15/2018   Headache disorder 06/17/2018   Family history of premature CAD 09/11/2017   Thyroid nodule 08/15/2017   LVH (left ventricular hypertrophy) 02/01/2017   Sinus tachycardia 02/24/2016   Valvular regurgitation 01/11/2016   ADHD (attention deficit hyperactivity disorder), combined type 08/30/2015   GAD (generalized anxiety disorder) 12/31/2014   Major depressive disorder, recurrent episode (HCC) 10/25/2014   Essential hypertension 10/25/2014    Past Surgical History:  Procedure Laterality Date   CESAREAN SECTION     ENDOMETRIAL ABLATION W/ NOVASURE  06/2008   estimate   thyroid nodule biopsy     TUBAL LIGATION      Family History  Problem Relation Age of Onset   Heart disease Mother    Hyperlipidemia Mother    Hypertension Mother    Miscarriages / [place] Mother        2   Anxiety disorder Mother    Coronary artery disease Mother 4       11 stents   Alcohol abuse Father    Cancer Father        testicular   Depression Father    Heart disease Sister    Hyperlipidemia Sister    Hypertension Sister    Other Sister         Langerhans Cell Histiocytosis   Drug abuse Brother    Alcohol abuse Brother    Depression Brother    ADD / ADHD Son    Mental illness Maternal Aunt    Alcohol abuse Maternal Grandfather    Depression Maternal Grandfather    Anxiety disorder Maternal Grandfather    Cancer Paternal Grandmother    Breast cancer Paternal Grandmother        late 70's   Alcohol abuse Paternal Grandfather     Social History   Tobacco Use   Smoking status: Former    Packs/day: .5    Types: Cigarettes, E-cigarettes    Start date: 09/14/1995    Quit date: 01/10/2021    Years since quitting: 2.0   Smokeless tobacco: Never   Tobacco comments:    On an off over the years. She smoked for 12 years solid, otherwise on and off   Substance Use Topics    Alcohol use: Yes    Alcohol/week: 0.0 standard drinks of alcohol    Comment: rare     Current Outpatient Medications:    atorvastatin (LIPITOR) 20 MG tablet, Take 1 tablet (20 mg total) by mouth daily., Disp: 90 tablet, Rfl: 1   Homeopathic Products (FRANKINCENSE UPLIFTING) OIL, Inhale 5 drops into the lungs daily., Disp: , Rfl:    hydrOXYzine (  ATARAX) 50 MG tablet, TAKE ONE TABLET BY MOUTH AT BEDTIME, Disp: 90 tablet, Rfl: 1   lisdexamfetamine (VYVANSE) 50 MG capsule, Take 1 capsule (50 mg total) by mouth daily., Disp: 90 capsule, Rfl: 0   metoprolol succinate (TOPROL-XL) 25 MG 24 hr tablet, Take 1 tablet (25 mg total) by mouth daily., Disp: 90 tablet, Rfl: 1   olmesartan (BENICAR) 20 MG tablet, Take 1 tablet (20 mg total) by mouth daily., Disp: 90 tablet, Rfl: 1   rizatriptan (MAXALT-MLT) 10 MG disintegrating tablet, Take 1 tablet (10 mg total) by mouth as needed for migraine. May repeat in 2 hours if needed (Patient not taking: Reported on 01/11/2023), Disp: 10 tablet, Rfl: 0   Ubrogepant (UBRELVY) 100 MG TABS, Take 1 tablet by mouth daily as needed. (Patient not taking: Reported on 01/11/2023), Disp: 9 tablet, Rfl: 1  Allergies  Allergen Reactions   Penicillins Anaphylaxis   Latex Hives and Rash    I personally reviewed active problem list, medication list, allergies, family history, social history, health maintenance with the patient/caregiver today.   ROS  Constitutional: Negative for fever , positive for weight change.  Respiratory: Negative for cough and shortness of breath.   Cardiovascular: Negative for chest pain or palpitations.  Gastrointestinal: Negative for abdominal pain, no bowel changes.  Musculoskeletal: Negative for gait problem or joint swelling.  Skin: Negative for rash.  Neurological: Negative for dizziness or headache.  No other specific complaints in a complete review of systems (except as listed in HPI above).   Objective  Vitals:   01/11/23 0859  01/11/23 0930  BP: 106/68 112/68  Pulse: 88   Resp: 16   Temp: 97.9 F (36.6 C)   TempSrc: Oral   SpO2: 99%   Weight: 193 lb 1.6 oz (87.6 kg)   Height: 5\' 4"  (1.626 m)     Body mass index is 33.15 kg/m.  Physical Exam  Constitutional: Patient appears well-developed and well-nourished. Obese  No distress.  HEENT: head atraumatic, normocephalic, pupils equal and reactive to light, neck supple Cardiovascular: Normal rate, regular rhythm and normal heart sounds.  No murmur heard. No BLE edema. Pulmonary/Chest: Effort normal and breath sounds normal. No respiratory distress. Abdominal: Soft.  There is no tenderness. Psychiatric: Patient has a normal mood and affect. behavior is normal. Judgment and thought content normal.   Recent Results (from the past 2160 hour(s))  POCT HgB A1C     Status: Abnormal   Collection Time: 10/24/22 10:12 AM  Result Value Ref Range   Hemoglobin A1C 5.7 (A) 4.0 - 5.6 %   HbA1c POC (<> result, manual entry)     HbA1c, POC (prediabetic range)     HbA1c, POC (controlled diabetic range)      PHQ2/9:    01/11/2023    8:59 AM 10/24/2022   10:11 AM 07/19/2022    7:50 AM 04/18/2022    8:58 AM 01/15/2022    9:47 AM  Depression screen PHQ 2/9  Decreased Interest 0 0 0 0 0  Down, Depressed, Hopeless 0 0 0 0 0  PHQ - 2 Score 0 0 0 0 0  Altered sleeping 0 0 0 0 0  Tired, decreased energy 0 0 0 2 0  Change in appetite 0 0 0 0 0  Feeling bad or failure about yourself  0 0 0 0 0  Trouble concentrating 0 0 0 0 0  Moving slowly or fidgety/restless 0 0 0 0 0  Suicidal thoughts 0 0 0  0 0  PHQ-9 Score 0 0 0 2 0  Difficult doing work/chores Not difficult at all        phq 9 is negative   Fall Risk:    01/11/2023    8:59 AM 10/24/2022   10:11 AM 07/19/2022    7:49 AM 04/18/2022    8:57 AM 01/15/2022    9:47 AM  Fall Risk   Falls in the past year? 0 0 0 0 0  Number falls in past yr: 0 0 0 0 0  Injury with Fall? 0 0 0 0 0  Risk for fall due to : No Fall Risks  No Fall Risks No Fall Risks No Fall Risks No Fall Risks  Follow up Falls prevention discussed;Education provided;Falls evaluation completed Falls prevention discussed Falls prevention discussed Falls prevention discussed Falls prevention discussed     Assessment & Plan   1. Hypertension associated with diabetes (HCC)  Bp towards low end of normal, she will return for bp check only with CMA we may need to cut benicar dose in half   2. Hyperlipidemia associated with type 2 diabetes mellitus (HCC)  Continue Atorvastatin   3. Major depression in remission [facility])  Doing well   4. ADHD (attention deficit hyperactivity disorder), combined type    lsdexamfetamine (VYVANSE) 50 MG capsule; Take 1 capsule (50 mg total) by mouth daily.  Dispense: 90 capsule; Refill: 0  5. GAD (generalized anxiety disorder)  Stable  6. Migraine with aura and without status migrainosus, not intractable   No recent episodes   7. TMJ (temporomandibular joint disorder)   Seeing dentist

## 2023-01-11 ENCOUNTER — Encounter: Payer: Self-pay | Admitting: Family Medicine

## 2023-01-11 ENCOUNTER — Ambulatory Visit (INDEPENDENT_AMBULATORY_CARE_PROVIDER_SITE_OTHER): Payer: Self-pay | Admitting: Family Medicine

## 2023-01-11 VITALS — BP 112/68 | HR 88 | Temp 97.9°F | Resp 16 | Ht 64.0 in | Wt 193.1 lb

## 2023-01-11 DIAGNOSIS — M26609 Unspecified temporomandibular joint disorder, unspecified side: Secondary | ICD-10-CM

## 2023-01-11 DIAGNOSIS — E785 Hyperlipidemia, unspecified: Secondary | ICD-10-CM

## 2023-01-11 DIAGNOSIS — G43109 Migraine with aura, not intractable, without status migrainosus: Secondary | ICD-10-CM

## 2023-01-11 DIAGNOSIS — E1169 Type 2 diabetes mellitus with other specified complication: Secondary | ICD-10-CM

## 2023-01-11 DIAGNOSIS — F325 Major depressive disorder, single episode, in full remission: Secondary | ICD-10-CM

## 2023-01-11 DIAGNOSIS — F411 Generalized anxiety disorder: Secondary | ICD-10-CM

## 2023-01-11 DIAGNOSIS — E1159 Type 2 diabetes mellitus with other circulatory complications: Secondary | ICD-10-CM

## 2023-01-11 DIAGNOSIS — I152 Hypertension secondary to endocrine disorders: Secondary | ICD-10-CM

## 2023-01-11 DIAGNOSIS — F902 Attention-deficit hyperactivity disorder, combined type: Secondary | ICD-10-CM

## 2023-01-11 MED ORDER — LISDEXAMFETAMINE DIMESYLATE 50 MG PO CAPS: 50.0000 mg | ORAL_CAPSULE | Freq: Every day | ORAL | 0 refills | Status: DC

## 2023-01-26 ENCOUNTER — Encounter: Payer: Self-pay | Admitting: Family Medicine

## 2023-01-29 NOTE — Progress Notes (Deleted)
Name: Melanie Villarreal   MRN: [id number]    DOB: 1975-08-02   Date:01/29/2023       Progress Note  Subjective  Chief Complaint  Syncope  HPI  *** Patient Active Problem List   Diagnosis Date Noted   Hypertension associated with diabetes (HCC) 01/15/2022   Major depression in remission (HCC) 01/15/2022   Palpitations 10/05/2021   Hypertension 10/05/2021   Obesity (BMI 30-39.9) 10/05/2021   Hypertension associated with type 2 diabetes mellitus (HCC) 12/09/2019   Hyperlipidemia associated with type 2 diabetes mellitus (HCC) 07/02/2019   Urge incontinence 08/15/2018   Headache disorder 06/17/2018   Family history of premature CAD 09/11/2017   Thyroid nodule 08/15/2017   LVH (left ventricular hypertrophy) 02/01/2017   Sinus tachycardia 02/24/2016   Valvular regurgitation 01/11/2016   ADHD (attention deficit hyperactivity disorder), combined type 08/30/2015   GAD (generalized anxiety disorder) 12/31/2014   Major depressive disorder, recurrent episode (HCC) 10/25/2014   Essential hypertension 10/25/2014    Past Surgical History:  Procedure Laterality Date   CESAREAN SECTION     ENDOMETRIAL ABLATION W/ NOVASURE  06/2008   estimate   thyroid nodule biopsy     TUBAL LIGATION      Family History  Problem Relation Age of Onset   Heart disease Mother    Hyperlipidemia Mother    Hypertension Mother    Miscarriages / [place] Mother        2   Anxiety disorder Mother    Coronary artery disease Mother 32       11 stents   Alcohol abuse Father    Cancer Father        testicular   Depression Father    Heart disease Sister    Hyperlipidemia Sister    Hypertension Sister    Other Sister         Langerhans Cell Histiocytosis   Drug abuse Brother    Alcohol abuse Brother    Depression Brother    ADD / ADHD Son    Mental illness Maternal Aunt    Alcohol abuse Maternal Grandfather    Depression Maternal Grandfather    Anxiety disorder Maternal Grandfather    Cancer  Paternal Grandmother    Breast cancer Paternal Grandmother        late 70's   Alcohol abuse Paternal Grandfather     Social History   Tobacco Use   Smoking status: Former    Current packs/day: 0.00    Average packs/day: 0.5 packs/day for 25.3 years (12.7 ttl pk-yrs)    Types: Cigarettes, E-cigarettes    Start date: 09/14/1995    Quit date: 01/10/2021    Years since quitting: 2.0   Smokeless tobacco: Never   Tobacco comments:    On an off over the years. She smoked for 12 years solid, otherwise on and off   Substance Use Topics   Alcohol use: Yes    Alcohol/week: 0.0 standard drinks of alcohol    Comment: rare     Current Outpatient Medications:    atorvastatin (LIPITOR) 20 MG tablet, Take 1 tablet (20 mg total) by mouth daily., Disp: 90 tablet, Rfl: 1   Homeopathic Products (FRANKINCENSE UPLIFTING) OIL, Inhale 5 drops into the lungs daily., Disp: , Rfl:    hydrOXYzine (ATARAX) 50 MG tablet, TAKE ONE TABLET BY MOUTH AT BEDTIME, Disp: 90 tablet, Rfl: 1   lisdexamfetamine (VYVANSE) 50 MG capsule, Take 1 capsule (50 mg total) by mouth daily., Disp: 90 capsule, Rfl: 0  metoprolol succinate (TOPROL-XL) 25 MG 24 hr tablet, Take 1 tablet (25 mg total) by mouth daily., Disp: 90 tablet, Rfl: 1   olmesartan (BENICAR) 20 MG tablet, Take 1 tablet (20 mg total) by mouth daily., Disp: 90 tablet, Rfl: 1   rizatriptan (MAXALT-MLT) 10 MG disintegrating tablet, Take 1 tablet (10 mg total) by mouth as needed for migraine. May repeat in 2 hours if needed (Patient not taking: Reported on 01/11/2023), Disp: 10 tablet, Rfl: 0   Ubrogepant (UBRELVY) 100 MG TABS, Take 1 tablet by mouth daily as needed. (Patient not taking: Reported on 01/11/2023), Disp: 9 tablet, Rfl: 1  Allergies  Allergen Reactions   Penicillins Anaphylaxis   Latex Hives and Rash    I personally reviewed active problem list, medication list, allergies, family history, social history, health maintenance with the patient/caregiver  today.   ROS  ***  Objective  There were no vitals filed for this visit.  There is no height or weight on file to calculate BMI.  Physical Exam ***  No results found for this or any previous visit (from the past 2160 hour(s)).  Diabetic Foot Exam: Diabetic Foot Exam - Simple   No data filed     PHQ2/9:    01/11/2023    8:59 AM 10/24/2022   10:11 AM 07/19/2022    7:50 AM 04/18/2022    8:58 AM 01/15/2022    9:47 AM  Depression screen PHQ 2/9  Decreased Interest 0 0 0 0 0  Down, Depressed, Hopeless 0 0 0 0 0  PHQ - 2 Score 0 0 0 0 0  Altered sleeping 0 0 0 0 0  Tired, decreased energy 0 0 0 2 0  Change in appetite 0 0 0 0 0  Feeling bad or failure about yourself  0 0 0 0 0  Trouble concentrating 0 0 0 0 0  Moving slowly or fidgety/restless 0 0 0 0 0  Suicidal thoughts 0 0 0 0 0  PHQ-9 Score 0 0 0 2 0  Difficult doing work/chores Not difficult at all        phq 9 is {gen pos [id number]}   Fall Risk:    01/11/2023    8:59 AM 10/24/2022   10:11 AM 07/19/2022    7:49 AM 04/18/2022    8:57 AM 01/15/2022    9:47 AM  Fall Risk   Falls in the past year? 0 0 0 0 0  Number falls in past yr: 0 0 0 0 0  Injury with Fall? 0 0 0 0 0  Risk for fall due to : No Fall Risks No Fall Risks No Fall Risks No Fall Risks No Fall Risks  Follow up Falls prevention discussed;Education provided;Falls evaluation completed Falls prevention discussed Falls prevention discussed Falls prevention discussed Falls prevention discussed      Functional Status Survey:      Assessment & Plan  *** There are no diagnoses linked to this encounter.

## 2023-01-30 ENCOUNTER — Other Ambulatory Visit: Payer: Self-pay | Admitting: Family Medicine

## 2023-01-30 ENCOUNTER — Ambulatory Visit: Payer: Self-pay | Admitting: Family Medicine

## 2023-01-30 ENCOUNTER — Other Ambulatory Visit: Payer: Self-pay

## 2023-01-30 DIAGNOSIS — I517 Cardiomegaly: Secondary | ICD-10-CM

## 2023-01-30 DIAGNOSIS — R55 Syncope and collapse: Secondary | ICD-10-CM

## 2023-01-30 DIAGNOSIS — Z8249 Family history of ischemic heart disease and other diseases of the circulatory system: Secondary | ICD-10-CM

## 2023-01-30 DIAGNOSIS — R002 Palpitations: Secondary | ICD-10-CM

## 2023-01-30 DIAGNOSIS — R0789 Other chest pain: Secondary | ICD-10-CM

## 2023-01-30 NOTE — Progress Notes (Signed)
Ref to local Cardiologist placed per patient request/MD advice.

## 2023-02-08 ENCOUNTER — Other Ambulatory Visit: Payer: Self-pay | Admitting: Family Medicine

## 2023-02-08 DIAGNOSIS — I10 Essential (primary) hypertension: Secondary | ICD-10-CM

## 2023-02-13 ENCOUNTER — Other Ambulatory Visit: Payer: Self-pay | Admitting: Family Medicine

## 2023-02-13 ENCOUNTER — Other Ambulatory Visit: Payer: Self-pay

## 2023-02-13 MED ORDER — OLMESARTAN MEDOXOMIL 20 MG PO TABS: 10.0000 mg | ORAL_TABLET | Freq: Every day | ORAL | 0 refills | Status: DC

## 2023-04-05 ENCOUNTER — Other Ambulatory Visit: Payer: Self-pay | Admitting: Family Medicine

## 2023-04-05 MED ORDER — OLMESARTAN MEDOXOMIL 20 MG PO TABS: 10.0000 mg | ORAL_TABLET | Freq: Every day | ORAL | 0 refills | Status: DC

## 2023-04-15 ENCOUNTER — Ambulatory Visit: Payer: Self-pay | Attending: Cardiology | Admitting: Cardiology

## 2023-04-16 ENCOUNTER — Encounter: Payer: Self-pay | Admitting: Cardiology

## 2023-04-20 ENCOUNTER — Other Ambulatory Visit: Payer: Self-pay | Admitting: Family Medicine

## 2023-04-20 DIAGNOSIS — E782 Mixed hyperlipidemia: Secondary | ICD-10-CM

## 2023-04-23 NOTE — Progress Notes (Unsigned)
Name: Melanie Villarreal   MRN: [id number]    DOB: [birth year]   Date:04/24/2023       Progress Note  Subjective  Chief Complaint  Follow Up  HPI  DMII:  she had gestational diabetes with her middle child, it resolved after delivered in 2005 , she was diagnosed with type II DM in 2017 she has associated hypertension, dyslipidemia, obese, she also has neuropahty. She was  on Ozempic since May 2020, when A1C was 7.2 % , since she has been on medication her A1C has been well controlled , last visit was 5.7 % and today is up to 6.5 % She is off Ozempic due to cost, no insurance at this time, but has been compliant with Benicar and Atorvastatin .  Hot Flashes: she states that over the past few months she has noticed hot flashes that is followed by a need to vomit and if she does not vomit she gets pale and had a syncopal episode. She states it has happened a couple of times a month since July. She only had a syncopal in July after that she vomits and symptoms resolved She cannot afford other tests at this time.   Dyslipidemia: Last LDL was up from 52 to 92 and last visit it was 57  , last triglycerides was 166 She is compliant with Atorvastatin and denies side effects  She is going to have labs done today    HTN: bp is towards low end of normal, she is taking  Benicar 20 mg dose  ( she never changed to half a pill ) and also  metoprolol, heart rate is at goal, denies recent episode of palpation .   Obesity : she has been obese since age 16. She states worse since the last pregnancy. Her max weight was 262 lbs around 2016  Started diet plus Ozempic in May 1st  2020 when her weight was 235 lbs and weight was stable  181 -184 lbs however due to lack of insurance she has been off Ozempic since January 2024 , she is eating more but trying to have healthy snacks but weight today is up to 193.1 lbs.    ADHD: she states symptoms started in childhood, but was treated at age 47 , she has tried Adderal - caused anger  outburst, felt tired at the end of the day, Strattera caused sedation, Ritalin caused palpitation. She has been on Vyvanse for the past 5 years and is working well for her. No side effects of medication, except is helps a little on her appetite She states her boss notices when she does not take the medication. She states her husband and children also noticed right away when she skips the dose. She has been getting 90 day supply of 50 mg dose.She is out of insurance but states unable to function without medication and has been paying cash for the prescription. 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 Her parents were going through a divorce, mother left and stayed with her step-father, she has one brother that died as an infant and another one that died from drug overdose at age 94 ( she was  47 ). There is a family history of depression. She was on Zoloft from age 47 until year 2019 when she was switched from Zoloft to Viibrid because she had a relapse. ( she also states while on  zoloft tried some other SSRI - not sure of the name).  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 She stopped seeing psychiatrist because of turn over of providers. She stopped taking Viibrid on May 13 th 22 because she forgot to take medication on vacation and felt fine without it, she is doing well on Hydroxizine prn, states Vyvanse also helps with her mood . She stopped  doing some cranial sacral therapy. She is doing daily devotional and bible studies, exercising three nights a week.    Migraine headaches: she states seldom has episodes, she states she has a tingling sensation on her scalp followed by a headache, it is described as a "zap" sensation . She has phonophobia and photophobia. She does not like taking Maxalt because of side effects and has some Ubrelvy at the  pharmacy but has not started it yet. She only had one episode in 2023 , she states one episode in 2024 due to not sleeping while driving to her grand-father's funeral  Goiter : large and palpable on right side. She states does not having insurance now and has not seen Endo. We will recheck TSH and T4 she states has difficulty swallowing and sometimes causes problems breathing.   Patient Active Problem List   Diagnosis Date Noted   Hypertension associated with diabetes (HCC) 01/15/2022   Major depression in remission (HCC) 01/15/2022   Palpitations 10/05/2021   Hypertension 10/05/2021   Obesity (BMI 30-39.9) 10/05/2021   Hypertension associated with type 2 diabetes mellitus (HCC) 12/09/2019   Hyperlipidemia associated with type 2 diabetes mellitus (HCC) 07/02/2019   Urge incontinence 08/15/2018   Headache disorder 06/17/2018   Family history of premature CAD 09/11/2017   Thyroid nodule 08/15/2017   LVH (left ventricular hypertrophy) 02/01/2017   Sinus tachycardia 02/24/2016   Valvular regurgitation 01/11/2016   ADHD (attention deficit hyperactivity disorder), combined type 08/30/2015   GAD (generalized anxiety disorder) 12/31/2014   Major depressive disorder, recurrent episode (HCC) 10/25/2014   Essential hypertension 10/25/2014    Past Surgical History:  Procedure Laterality Date   CESAREAN SECTION     ENDOMETRIAL ABLATION W/ NOVASURE  06/2008   estimate   thyroid nodule biopsy     TUBAL LIGATION      Family History  Problem Relation Age of Onset   Heart disease Mother    Hyperlipidemia Mother    Hypertension Mother    Miscarriages / [place] Mother        2   Anxiety disorder Mother    Coronary artery disease Mother 31       11 stents   Alcohol abuse Father    Cancer Father        testicular   Depression Father    Heart disease Sister    Hyperlipidemia Sister    Hypertension Sister    Other Sister         Langerhans Cell Histiocytosis   Drug abuse Brother     Alcohol abuse Brother    Depression Brother    ADD / ADHD Son    Mental illness Maternal Aunt    Alcohol abuse Maternal Grandfather    Depression Maternal Grandfather    Anxiety disorder Maternal Grandfather    Cancer Paternal Grandmother    Breast cancer Paternal Grandmother        late 70's   Alcohol abuse Paternal Grandfather     Social History   Tobacco Use  Smoking status: Former    Current packs/day: 0.00    Average packs/day: 0.5 packs/day for 25.3 years (12.7 ttl pk-yrs)    Types: Cigarettes, E-cigarettes    Start date: 09/14/1995    Quit date: 01/10/2021    Years since quitting: 2.2   Smokeless tobacco: Never   Tobacco comments:    On an off over the years. She smoked for 12 years solid, otherwise on and off   Substance Use Topics   Alcohol use: Yes    Alcohol/week: 0.0 standard drinks of alcohol    Comment: rare     Current Outpatient Medications:    atorvastatin (LIPITOR) 20 MG tablet, TAKE ONE TABLET BY MOUTH ONE TIME DAILY, Disp: 90 tablet, Rfl: 0   Homeopathic Products (FRANKINCENSE UPLIFTING) OIL, Inhale 5 drops into the lungs daily., Disp: , Rfl:    hydrOXYzine (ATARAX) 50 MG tablet, TAKE ONE TABLET BY MOUTH AT BEDTIME, Disp: 90 tablet, Rfl: 1   lisdexamfetamine (VYVANSE) 50 MG capsule, Take 1 capsule (50 mg total) by mouth daily., Disp: 90 capsule, Rfl: 0   metoprolol succinate (TOPROL-XL) 25 MG 24 hr tablet, Take 1 tablet (25 mg total) by mouth daily., Disp: 90 tablet, Rfl: 1   olmesartan (BENICAR) 20 MG tablet, Take 0.5 tablets (10 mg total) by mouth daily., Disp: 45 tablet, Rfl: 0   rizatriptan (MAXALT-MLT) 10 MG disintegrating tablet, Take 1 tablet (10 mg total) by mouth as needed for migraine. May repeat in 2 hours if needed (Patient not taking: Reported on 01/11/2023), Disp: 10 tablet, Rfl: 0   Ubrogepant (UBRELVY) 100 MG TABS, Take 1 tablet by mouth daily as needed. (Patient not taking: Reported on 01/11/2023), Disp: 9 tablet, Rfl: 1  Allergies   Allergen Reactions   Penicillins Anaphylaxis   Latex Hives and Rash    I personally reviewed active problem list, medication list, allergies, family history, social history, health maintenance with the patient/caregiver today.   ROS  Constitutional: Negative for fever or weight change.  Respiratory: Negative for cough and shortness of breath.   Cardiovascular: Negative for chest pain or palpitations.  Gastrointestinal: Negative for abdominal pain, no bowel changes.  Musculoskeletal: Negative for gait problem or joint swelling.  Skin: Negative for rash.  Neurological: Negative for dizziness or headache.  No other specific complaints in a complete review of systems (except as listed in HPI above).   Objective  Vitals:   04/24/23 1125  BP: 118/74  Pulse: 96  Resp: 16  SpO2: 98%  Weight: 198 lb (89.8 kg)  Height: 5\' 5"  (1.651 m)    Body mass index is 32.95 kg/m.  Physical Exam  Constitutional: Patient appears well-developed and well-nourished. Obese  No distress.  HEENT: head atraumatic, normocephalic, pupils equal and reactive to light, neck supple Cardiovascular: Normal rate, regular rhythm and normal heart sounds.  No murmur heard. No BLE edema. Pulmonary/Chest: Effort normal and breath sounds normal. No respiratory distress. Abdominal: Soft.  There is no tenderness. Psychiatric: Patient has a normal mood and affect. behavior is normal. Judgment and thought content normal.   Recent Results (from the past 2160 hour(s))  POCT HgB A1C     Status: Abnormal   Collection Time: 04/24/23 11:29 AM  Result Value Ref Range   Hemoglobin A1C 6.5 (A) 4.0 - 5.6 %   HbA1c POC (<> result, manual entry)     HbA1c, POC (prediabetic range)     HbA1c, POC (controlled diabetic range)       PHQ2/9:  04/24/2023   11:26 AM 01/11/2023    8:59 AM 10/24/2022   10:11 AM 07/19/2022    7:50 AM 04/18/2022    8:58 AM  Depression screen PHQ 2/9  Decreased Interest 0 0 0 0 0  Down,  Depressed, Hopeless 0 0 0 0 0  PHQ - 2 Score 0 0 0 0 0  Altered sleeping 0 0 0 0 0  Tired, decreased energy 0 0 0 0 2  Change in appetite 0 0 0 0 0  Feeling bad or failure about yourself  0 0 0 0 0  Trouble concentrating 0 0 0 0 0  Moving slowly or fidgety/restless 0 0 0 0 0  Suicidal thoughts 0 0 0 0 0  PHQ-9 Score 0 0 0 0 2  Difficult doing work/chores  Not difficult at all       phq 9 is negative   Fall Risk:    04/24/2023   11:26 AM 01/11/2023    8:59 AM 10/24/2022   10:11 AM 07/19/2022    7:49 AM 04/18/2022    8:57 AM  Fall Risk   Falls in the past year? 0 0 0 0 0  Number falls in past yr: 0 0 0 0 0  Injury with Fall? 0 0 0 0 0  Risk for fall due to : No Fall Risks No Fall Risks No Fall Risks No Fall Risks No Fall Risks  Follow up Falls prevention discussed Falls prevention discussed;Education provided;Falls evaluation completed Falls prevention discussed Falls prevention discussed Falls prevention discussed      Functional Status Survey: Is the patient deaf or have difficulty hearing?: No Does the patient have difficulty seeing, even when wearing glasses/contacts?: No Does the patient have difficulty concentrating, remembering, or making decisions?: No Does the patient have difficulty walking or climbing stairs?: No Does the patient have difficulty dressing or bathing?: No Does the patient have difficulty doing errands alone such as visiting a doctor's office or shopping?: No    Assessment & Plan  1. Hyperlipidemia associated with type 2 diabetes mellitus (HCC)  - POCT HgB A1C - Urine Microalbumin w/creat. ratio - HM Diabetes Foot Exam - Lipid panel  2. Major depression in remission (HCC)  - hydrOXYzine (ATARAX) 50 MG tablet; Take 1 tablet (50 mg total) by mouth at bedtime.  Dispense: 90 tablet; Refill: 1  3. Essential hypertension  - olmesartan (BENICAR) 20 MG tablet; Take 1 tablet (20 mg total) by mouth daily.  Dispense: 90 tablet; Refill: 0 - metoprolol  succinate (TOPROL-XL) 25 MG 24 hr tablet; Take 1 tablet (25 mg total) by mouth daily.  Dispense: 90 tablet; Refill: 1 - CBC with Differential/Platelet - COMPLETE METABOLIC PANEL WITH GFR  4. ADHD (attention deficit hyperactivity disorder), combined type  - lisdexamfetamine (VYVANSE) 50 MG capsule; Take 1 capsule (50 mg total) by mouth daily.  Dispense: 90 capsule; Refill: 0  5. GAD (generalized anxiety disorder)  - hydrOXYzine (ATARAX) 50 MG tablet; Take 1 tablet (50 mg total) by mouth at bedtime.  Dispense: 90 tablet; Refill: 1  6. Migraine with aura and without status migrainosus, not intractable  stable  7. Family history of thyroid disease  - Thyroid Panel With TSH  8. Goiter  - Thyroid Panel With TSH

## 2023-04-24 ENCOUNTER — Ambulatory Visit (INDEPENDENT_AMBULATORY_CARE_PROVIDER_SITE_OTHER): Payer: Self-pay | Admitting: Family Medicine

## 2023-04-24 ENCOUNTER — Encounter: Payer: Self-pay | Admitting: Family Medicine

## 2023-04-24 VITALS — BP 118/74 | HR 96 | Resp 16 | Ht 65.0 in | Wt 198.0 lb

## 2023-04-24 DIAGNOSIS — E785 Hyperlipidemia, unspecified: Secondary | ICD-10-CM

## 2023-04-24 DIAGNOSIS — F411 Generalized anxiety disorder: Secondary | ICD-10-CM

## 2023-04-24 DIAGNOSIS — G43109 Migraine with aura, not intractable, without status migrainosus: Secondary | ICD-10-CM

## 2023-04-24 DIAGNOSIS — F902 Attention-deficit hyperactivity disorder, combined type: Secondary | ICD-10-CM

## 2023-04-24 DIAGNOSIS — E1169 Type 2 diabetes mellitus with other specified complication: Secondary | ICD-10-CM

## 2023-04-24 DIAGNOSIS — F325 Major depressive disorder, single episode, in full remission: Secondary | ICD-10-CM

## 2023-04-24 DIAGNOSIS — E049 Nontoxic goiter, unspecified: Secondary | ICD-10-CM

## 2023-04-24 DIAGNOSIS — I10 Essential (primary) hypertension: Secondary | ICD-10-CM

## 2023-04-24 DIAGNOSIS — Z8349 Family history of other endocrine, nutritional and metabolic diseases: Secondary | ICD-10-CM

## 2023-04-24 LAB — POCT GLYCOSYLATED HEMOGLOBIN (HGB A1C): Hemoglobin A1C: 6.5 % — AB (ref 4.0–5.6)

## 2023-04-24 MED ORDER — HYDROXYZINE HCL 50 MG PO TABS
50.0000 mg | ORAL_TABLET | Freq: Every day | ORAL | 1 refills | Status: DC
Start: 2023-04-24 — End: 2023-10-16

## 2023-04-24 MED ORDER — LISDEXAMFETAMINE DIMESYLATE 50 MG PO CAPS
50.0000 mg | ORAL_CAPSULE | Freq: Every day | ORAL | 0 refills | Status: DC
Start: 2023-04-24 — End: 2023-07-18

## 2023-04-24 MED ORDER — OLMESARTAN MEDOXOMIL 20 MG PO TABS
20.0000 mg | ORAL_TABLET | Freq: Every day | ORAL | 0 refills | Status: DC
Start: 2023-04-24 — End: 2023-10-16

## 2023-04-24 MED ORDER — METOPROLOL SUCCINATE ER 25 MG PO TB24
25.0000 mg | ORAL_TABLET | Freq: Every day | ORAL | 1 refills | Status: DC
Start: 2023-04-24 — End: 2023-10-16

## 2023-05-22 ENCOUNTER — Other Ambulatory Visit: Payer: Self-pay

## 2023-05-22 ENCOUNTER — Encounter: Payer: Self-pay | Admitting: Nurse Practitioner

## 2023-05-22 ENCOUNTER — Ambulatory Visit (INDEPENDENT_AMBULATORY_CARE_PROVIDER_SITE_OTHER): Payer: Self-pay | Admitting: Nurse Practitioner

## 2023-05-22 VITALS — BP 124/72 | HR 94 | Temp 98.4°F | Resp 16 | Ht 65.0 in | Wt 197.0 lb

## 2023-05-22 DIAGNOSIS — Z20828 Contact with and (suspected) exposure to other viral communicable diseases: Secondary | ICD-10-CM

## 2023-05-22 NOTE — Progress Notes (Signed)
   BP 124/72   Pulse 94   Temp 98.4 F (36.9 C) (Oral)   Resp 16   Ht 5\' 5"  (1.651 m)   Wt 197 lb (89.4 kg)   SpO2 97%   BMI 32.78 kg/m    Subjective:    Patient ID: Melanie Villarreal, female    DOB: 1976-06-01, 47 y.o.   MRN: [id number]  HPI: Melanie Villarreal is a 47 y.o. female  Chief Complaint  Patient presents with   Consult    Patient would like to be tested for mono. Covid test negative    URI:  she reports that symptoms started Sunday morning.  She says she has had chills, fever, cough.  She says she did take a covid test and it was negative.  She reports she wants to be checked for mono because she has a history of mono when she was younger and her daughter recently tested positive.   Relevant past medical, surgical, family and social history reviewed and updated as indicated. Interim medical history since our last visit reviewed. Allergies and medications reviewed and updated.  Review of Systems  Ten systems reviewed and is negative except as mentioned in HPI       Objective:    BP 124/72   Pulse 94   Temp 98.4 F (36.9 C) (Oral)   Resp 16   Ht 5\' 5"  (1.651 m)   Wt 197 lb (89.4 kg)   SpO2 97%   BMI 32.78 kg/m   Wt Readings from Last 3 Encounters:  05/22/23 197 lb (89.4 kg)  04/24/23 198 lb (89.8 kg)  01/11/23 193 lb 1.6 oz (87.6 kg)    Physical Exam  Constitutional: Patient appears well-developed and well-nourished. Obese  No distress.  HEENT: head atraumatic, normocephalic, pupils equal and reactive to light, ears Tms clear, neck supple, throat within normal limits Cardiovascular: Normal rate, regular rhythm and normal heart sounds.  No murmur heard. No BLE edema. Pulmonary/Chest: Effort normal and breath sounds normal. No respiratory distress. Abdominal: Soft.  There is no tenderness. Psychiatric: Patient has a normal mood and affect. behavior is normal. Judgment and thought content normal.  Results for orders placed or performed in visit on 04/24/23   POCT HgB A1C  Result Value Ref Range   Hemoglobin A1C 6.5 (A) 4.0 - 5.6 %   HbA1c POC (<> result, manual entry)     HbA1c, POC (prediabetic range)     HbA1c, POC (controlled diabetic range)        Assessment & Plan:   Problem List Items Addressed This Visit   None Visit Diagnoses     Exposure to mononucleosis syndrome    -  Primary   will get monospot   Relevant Orders   Monospot        Follow up plan: Return if symptoms worsen or fail to improve.

## 2023-05-23 LAB — MONONUCLEOSIS SCREEN: Heterophile, Mono Screen: NEGATIVE

## 2023-05-23 LAB — CBC WITH DIFFERENTIAL/PLATELET
Absolute Lymphocytes: 2260 {cells}/uL (ref 850–3900)
Absolute Monocytes: 809 {cells}/uL (ref 200–950)
Basophils Absolute: 28 {cells}/uL (ref 0–200)
Basophils Relative: 0.3 %
Eosinophils Absolute: 326 {cells}/uL (ref 15–500)
Eosinophils Relative: 3.5 %
HCT: 44.8 % (ref 35.0–45.0)
Hemoglobin: 15.1 g/dL (ref 11.7–15.5)
MCH: 29.4 pg (ref 27.0–33.0)
MCHC: 33.7 g/dL (ref 32.0–36.0)
MCV: 87.2 fL (ref 80.0–100.0)
MPV: 11.1 fL (ref 7.5–12.5)
Monocytes Relative: 8.7 %
Neutro Abs: 5878 {cells}/uL (ref 1500–7800)
Neutrophils Relative %: 63.2 %
Platelets: 309 10*3/uL (ref 140–400)
RBC: 5.14 10*6/uL — ABNORMAL HIGH (ref 3.80–5.10)
RDW: 12.4 % (ref 11.0–15.0)
Total Lymphocyte: 24.3 %
WBC: 9.3 10*3/uL (ref 3.8–10.8)

## 2023-05-23 LAB — COMPLETE METABOLIC PANEL WITH GFR
AG Ratio: 1.6 (calc) (ref 1.0–2.5)
ALT: 16 U/L (ref 6–29)
AST: 13 U/L (ref 10–35)
Albumin: 4.4 g/dL (ref 3.6–5.1)
Alkaline phosphatase (APISO): 91 U/L (ref 31–125)
BUN: 9 mg/dL (ref 7–25)
CO2: 30 mmol/L (ref 20–32)
Calcium: 9.4 mg/dL (ref 8.6–10.2)
Chloride: 101 mmol/L (ref 98–110)
Creat: 0.65 mg/dL (ref 0.50–0.99)
Globulin: 2.8 g/dL (ref 1.9–3.7)
Glucose, Bld: 202 mg/dL — ABNORMAL HIGH (ref 65–99)
Potassium: 4.2 mmol/L (ref 3.5–5.3)
Sodium: 138 mmol/L (ref 135–146)
Total Bilirubin: 0.9 mg/dL (ref 0.2–1.2)
Total Protein: 7.2 g/dL (ref 6.1–8.1)
eGFR: 111 mL/min/{1.73_m2} (ref >=60)

## 2023-05-23 LAB — MICROALBUMIN / CREATININE URINE RATIO
Creatinine, Urine: 211 mg/dL (ref 20–275)
Microalb Creat Ratio: 3 mg/g{creat} (ref <30)
Microalb, Ur: 0.7 mg/dL

## 2023-05-23 LAB — THYROID PANEL WITH TSH
Free Thyroxine Index: 2 (ref 1.4–3.8)
T3 Uptake: 29 % (ref 22–35)
T4, Total: 6.8 ug/dL (ref 5.1–11.9)
TSH: 1.37 m[IU]/L

## 2023-05-23 LAB — LIPID PANEL
Cholesterol: 132 mg/dL (ref <200)
HDL: 36 mg/dL — ABNORMAL LOW (ref >=50)
LDL Cholesterol (Calc): 73 mg/dL
Non-HDL Cholesterol (Calc): 96 mg/dL (ref <130)
Total CHOL/HDL Ratio: 3.7 (calc) (ref <5.0)
Triglycerides: 150 mg/dL — ABNORMAL HIGH (ref <150)

## 2023-07-18 ENCOUNTER — Ambulatory Visit: Payer: Self-pay | Admitting: Family Medicine

## 2023-07-18 ENCOUNTER — Encounter: Payer: Self-pay | Admitting: Family Medicine

## 2023-07-18 VITALS — BP 128/76 | HR 101 | Resp 16 | Ht 65.0 in | Wt 206.5 lb

## 2023-07-18 DIAGNOSIS — E1169 Type 2 diabetes mellitus with other specified complication: Secondary | ICD-10-CM

## 2023-07-18 DIAGNOSIS — E785 Hyperlipidemia, unspecified: Secondary | ICD-10-CM

## 2023-07-18 DIAGNOSIS — E782 Mixed hyperlipidemia: Secondary | ICD-10-CM

## 2023-07-18 DIAGNOSIS — F325 Major depressive disorder, single episode, in full remission: Secondary | ICD-10-CM

## 2023-07-18 DIAGNOSIS — F411 Generalized anxiety disorder: Secondary | ICD-10-CM

## 2023-07-18 DIAGNOSIS — I10 Essential (primary) hypertension: Secondary | ICD-10-CM

## 2023-07-18 DIAGNOSIS — F902 Attention-deficit hyperactivity disorder, combined type: Secondary | ICD-10-CM

## 2023-07-18 DIAGNOSIS — G43109 Migraine with aura, not intractable, without status migrainosus: Secondary | ICD-10-CM

## 2023-07-18 LAB — POCT GLYCOSYLATED HEMOGLOBIN (HGB A1C): Hemoglobin A1C: 7.2 % — AB (ref 4.0–5.6)

## 2023-07-18 MED ORDER — ATORVASTATIN CALCIUM 20 MG PO TABS
20.0000 mg | ORAL_TABLET | Freq: Every day | ORAL | 1 refills | Status: DC
Start: 2023-07-18 — End: 2023-07-22

## 2023-07-18 MED ORDER — METFORMIN HCL ER 750 MG PO TB24
750.0000 mg | ORAL_TABLET | Freq: Every day | ORAL | 0 refills | Status: DC
Start: 2023-07-18 — End: 2023-10-16

## 2023-07-18 MED ORDER — LISDEXAMFETAMINE DIMESYLATE 50 MG PO CAPS
50.0000 mg | ORAL_CAPSULE | Freq: Every day | ORAL | 0 refills | Status: DC
Start: 2023-07-18 — End: 2023-10-16

## 2023-07-18 NOTE — Progress Notes (Signed)
 Name: Melanie Villarreal   MRN: [id number]    DOB: 30-Oct-1975   Date:07/18/2023       Progress Note  Subjective  Chief Complaint  Chief Complaint  Patient presents with   Medical Management of Chronic Issues    HPI  Discussed the use of AI scribe software for clinical note transcription with the patient, who gave verbal consent to proceed.  History of Present Illness   The patient, with a history of type 2 diabetes, hypertension, dyslipidemia, and  obesity, presents for a regular follow-up visit. She reports a recent weight gain and an increase in her HbA1c to 7.2, up from her previous level. The patient attributes this to a lapse in her Ozempic  medication due to insurance issues since January 2023. She also reports a change in dietary habits, including the consumption of unhealthy foods during the holiday season, which she believes contributed to her weight gain.  The patient also has a history of migraines, with only two episodes reported in the past year. She is currently on metoprolol , which she believes helps with her migraines. She also takes Vyvanse  for ADHD, which she reports helps with her focus and energy levels. Without it, she experiences fatigue and irritability.  In addition to these conditions, the patient has been diagnosed with major depressive disorder at age 48. However, she has not been on any medications for this condition for the past three years and questions whether she is still affected by it. She reports a significant improvement in her mood and energy levels since starting Vyvanse .  The patient also reports increased frequency of urination, to the point where she can barely make it to the bathroom. She denies any burning sensation or other urinary symptoms. She also reports increased hunger, which she is trying to manage by incorporating healthier foods into her diet.       Patient Active Problem List   Diagnosis Date Noted   Hypertension associated with diabetes  (HCC) 01/15/2022   Major depression in remission (HCC) 01/15/2022   Palpitations 10/05/2021   Hypertension 10/05/2021   Obesity (BMI 30-39.9) 10/05/2021   Hypertension associated with type 2 diabetes mellitus (HCC) 12/09/2019   Hyperlipidemia associated with type 2 diabetes mellitus (HCC) 07/02/2019   Urge incontinence 08/15/2018   Headache disorder 06/17/2018   Family history of premature CAD 09/11/2017   Thyroid  nodule 08/15/2017   LVH (left ventricular hypertrophy) 02/01/2017   Sinus tachycardia 02/24/2016   Valvular regurgitation 01/11/2016   ADHD (attention deficit hyperactivity disorder), combined type 08/30/2015   GAD (generalized anxiety disorder) 12/31/2014   Major depressive disorder, recurrent episode (HCC) 10/25/2014   Essential hypertension 10/25/2014    Past Surgical History:  Procedure Laterality Date   CESAREAN SECTION     ENDOMETRIAL ABLATION W/ NOVASURE  06/2008   estimate   thyroid  nodule biopsy     TUBAL LIGATION      Family History  Problem Relation Age of Onset   Heart disease Mother    Hyperlipidemia Mother    Hypertension Mother    Miscarriages / Stillbirths Mother        2   Anxiety disorder Mother    Coronary artery disease Mother 47       11 stents   Alcohol abuse Father    Cancer Father        testicular   Depression Father    Heart disease Sister    Hyperlipidemia Sister    Hypertension Sister    Other Sister  Langerhans Cell Histiocytosis   Drug abuse Brother    Alcohol abuse Brother    Depression Brother    ADD / ADHD Son    Mental illness Maternal Aunt    Alcohol abuse Maternal Grandfather    Depression Maternal Grandfather    Anxiety disorder Maternal Grandfather    Cancer Paternal Grandmother    Breast cancer Paternal Grandmother        late 70's   Alcohol abuse Paternal Grandfather     Social History   Tobacco Use   Smoking status: Former    Current packs/day: 0.00    Average packs/day: 0.5 packs/day for 25.3  years (12.7 ttl pk-yrs)    Types: Cigarettes, E-cigarettes    Start date: 09/14/1995    Quit date: 01/10/2021    Years since quitting: 2.5   Smokeless tobacco: Never   Tobacco comments:    On an off over the years. She smoked for 12 years solid, otherwise on and off   Substance Use Topics   Alcohol use: Yes    Alcohol/week: 0.0 standard drinks of alcohol    Comment: rare     Current Outpatient Medications:    Homeopathic Products (FRANKINCENSE UPLIFTING) OIL, Inhale 5 drops into the lungs daily., Disp: , Rfl:    hydrOXYzine  (ATARAX ) 50 MG tablet, Take 1 tablet (50 mg total) by mouth at bedtime., Disp: 90 tablet, Rfl: 1   metoprolol  succinate (TOPROL -XL) 25 MG 24 hr tablet, Take 1 tablet (25 mg total) by mouth daily., Disp: 90 tablet, Rfl: 1   olmesartan  (BENICAR ) 20 MG tablet, Take 1 tablet (20 mg total) by mouth daily., Disp: 90 tablet, Rfl: 0   atorvastatin  (LIPITOR) 20 MG tablet, Take 1 tablet (20 mg total) by mouth daily., Disp: 90 tablet, Rfl: 1   lisdexamfetamine (VYVANSE ) 50 MG capsule, Take 1 capsule (50 mg total) by mouth daily., Disp: 90 capsule, Rfl: 0   metFORMIN  (GLUCOPHAGE -XR) 750 MG 24 hr tablet, Take 1 tablet (750 mg total) by mouth daily with breakfast., Disp: 90 tablet, Rfl: 0   rizatriptan  (MAXALT -MLT) 10 MG disintegrating tablet, Take 1 tablet (10 mg total) by mouth as needed for migraine. May repeat in 2 hours if needed (Patient not taking: Reported on 07/18/2023), Disp: 10 tablet, Rfl: 0   Ubrogepant  (UBRELVY ) 100 MG TABS, Take 1 tablet by mouth daily as needed. (Patient not taking: Reported on 07/18/2023), Disp: 9 tablet, Rfl: 1  Allergies  Allergen Reactions   Penicillins Anaphylaxis   Latex Hives and Rash    I personally reviewed active problem list, medication list, allergies, family history with the patient/caregiver today.   ROS  Ten systems reviewed and is negative except as mentioned in HPI    Objective  Vitals:   07/18/23 0911  BP: 128/76  Pulse:  (!) 101  Resp: 16  SpO2: 100%  Weight: 206 lb 8 oz (93.7 kg)  Height: 5' 5 (1.651 m)    Body mass index is 34.36 kg/m.  Physical Exam  Constitutional: Patient appears well-developed and well-nourished. Obese  No distress.  HEENT: head atraumatic, normocephalic, pupils equal and reactive to light, neck supple Cardiovascular: Normal rate, regular rhythm and normal heart sounds.  No murmur heard. No BLE edema. Pulmonary/Chest: Effort normal and breath sounds normal. No respiratory distress. Abdominal: Soft.  There is no tenderness. Psychiatric: Patient has a normal mood and affect. behavior is normal. Judgment and thought content normal.   Recent Results (from the past 2160 hours)  POCT HgB  A1C     Status: Abnormal   Collection Time: 04/24/23 11:29 AM  Result Value Ref Range   Hemoglobin A1C 6.5 (A) 4.0 - 5.6 %   HbA1c POC (<> result, manual entry)     HbA1c, POC (prediabetic range)     HbA1c, POC (controlled diabetic range)    Urine Microalbumin w/creat. ratio     Status: None   Collection Time: 05/22/23  1:24 PM  Result Value Ref Range   Creatinine, Urine 211 20 - 275 mg/dL   Microalb, Ur 0.7 mg/dL    Comment: Reference Range Not established    Microalb Creat Ratio 3 <30 mg/g creat    Comment: . The ADA defines abnormalities in albumin excretion as follows: SABRA Albuminuria Category        Result (mg/g creatinine) . Normal to Mildly increased   <30 Moderately increased         30-299  Severely increased           > OR = 300 . The ADA recommends that at least two of three specimens collected within a 3-6 month period be abnormal before considering a patient to be within a diagnostic category.   CBC with Differential/Platelet     Status: Abnormal   Collection Time: 05/22/23  1:24 PM  Result Value Ref Range   WBC 9.3 3.8 - 10.8 Thousand/uL   RBC 5.14 (H) 3.80 - 5.10 Million/uL   Hemoglobin 15.1 11.7 - 15.5 g/dL   HCT 55.1 64.9 - 54.9 %   MCV 87.2 80.0 - 100.0 fL    MCH 29.4 27.0 - 33.0 pg   MCHC 33.7 32.0 - 36.0 g/dL    Comment: For adults, a slight decrease in the calculated MCHC value (in the range of 30 to 32 g/dL) is most likely not clinically significant; however, it should be interpreted with caution in correlation with other red cell parameters and the patient's clinical condition.    RDW 12.4 11.0 - 15.0 %   Platelets 309 140 - 400 Thousand/uL   MPV 11.1 7.5 - 12.5 fL   Neutro Abs 5,878 1,500 - 7,800 cells/uL   Absolute Lymphocytes 2,260 850 - 3,900 cells/uL   Absolute Monocytes 809 200 - 950 cells/uL   Eosinophils Absolute 326 15 - 500 cells/uL   Basophils Absolute 28 0 - 200 cells/uL   Neutrophils Relative % 63.2 %   Total Lymphocyte 24.3 %   Monocytes Relative 8.7 %   Eosinophils Relative 3.5 %   Basophils Relative 0.3 %  Lipid panel     Status: Abnormal   Collection Time: 05/22/23  1:24 PM  Result Value Ref Range   Cholesterol 132 <200 mg/dL   HDL 36 (L) > OR = 50 mg/dL   Triglycerides 849 (H) <150 mg/dL   LDL Cholesterol (Calc) 73 mg/dL (calc)    Comment: Reference range: <100 . Desirable range <100 mg/dL for primary prevention;   <70 mg/dL for patients with CHD or diabetic patients  with > or = 2 CHD risk factors. SABRA LDL-C is now calculated using the Martin-Hopkins  calculation, which is a validated novel method providing  better accuracy than the Friedewald equation in the  estimation of LDL-C.  Gladis APPLETHWAITE et al. SANDREA. 7986;689(80): 2061-2068  (http://education.[facility].com/faq/FAQ164)    Total CHOL/HDL Ratio 3.7 <5.0 (calc)   Non-HDL Cholesterol (Calc) 96 <869 mg/dL (calc)    Comment: For patients with diabetes plus 1 major ASCVD risk  factor, treating to a non-HDL-C  goal of <100 mg/dL  (LDL-C of <29 mg/dL) is considered a therapeutic  option.   COMPLETE METABOLIC PANEL WITH GFR     Status: Abnormal   Collection Time: 05/22/23  1:24 PM  Result Value Ref Range   Glucose, Bld 202 (H) 65 - 99 mg/dL     Comment: .            Fasting reference interval . For someone without known diabetes, a glucose value >125 mg/dL indicates that they may have diabetes and this should be confirmed with a follow-up test. .    BUN 9 7 - 25 mg/dL   Creat 9.34 9.49 - 9.00 mg/dL   eGFR 888 > OR = 60 fO/fpw/8.26f7   BUN/Creatinine Ratio SEE NOTE: 6 - 22 (calc)    Comment:    Not Reported: BUN and Creatinine are within    reference range. .    Sodium 138 135 - 146 mmol/L   Potassium 4.2 3.5 - 5.3 mmol/L   Chloride 101 98 - 110 mmol/L   CO2 30 20 - 32 mmol/L   Calcium  9.4 8.6 - 10.2 mg/dL   Total Protein 7.2 6.1 - 8.1 g/dL   Albumin 4.4 3.6 - 5.1 g/dL   Globulin 2.8 1.9 - 3.7 g/dL (calc)   AG Ratio 1.6 1.0 - 2.5 (calc)   Total Bilirubin 0.9 0.2 - 1.2 mg/dL   Alkaline phosphatase (APISO) 91 31 - 125 U/L   AST 13 10 - 35 U/L   ALT 16 6 - 29 U/L  Thyroid  Panel With TSH     Status: None   Collection Time: 05/22/23  1:24 PM  Result Value Ref Range   T3 Uptake 29 22 - 35 %   T4, Total 6.8 5.1 - 11.9 mcg/dL   Free Thyroxine Index 2.0 1.4 - 3.8   TSH 1.37 mIU/L    Comment:           Reference Range .           > or = 20 Years  0.40-4.50 .                Pregnancy Ranges           First trimester    0.26-2.66           Second trimester   0.55-2.73           Third trimester    0.43-2.91   Mononucleosis screen     Status: None   Collection Time: 05/22/23  1:24 PM  Result Value Ref Range   Heterophile, Mono Screen NEGATIVE NEGATIVE  POCT glycosylated hemoglobin (Hb A1C)     Status: Abnormal   Collection Time: 07/18/23  9:14 AM  Result Value Ref Range   Hemoglobin A1C 7.2 (A) 4.0 - 5.6 %   HbA1c POC (<> result, manual entry)     HbA1c, POC (prediabetic range)     HbA1c, POC (controlled diabetic range)      Diabetic Foot Exam:     PHQ2/9:    07/18/2023    9:09 AM 05/22/2023    1:08 PM 04/24/2023   11:26 AM 01/11/2023    8:59 AM 10/24/2022   10:11 AM  Depression screen PHQ 2/9  Decreased  Interest 0 0 0 0 0  Down, Depressed, Hopeless 0 0 0 0 0  PHQ - 2 Score 0 0 0 0 0  Altered sleeping 0  0 0 0  Tired, decreased energy 0  0 0 0  Change in appetite 0  0 0 0  Feeling bad or failure about yourself  0  0 0 0  Trouble concentrating 0  0 0 0  Moving slowly or fidgety/restless 0  0 0 0  Suicidal thoughts 0  0 0 0  PHQ-9 Score 0  0 0 0  Difficult doing work/chores Not difficult at all   Not difficult at all     phq 9 is negative   Fall Risk:    07/18/2023    8:59 AM 05/22/2023    1:08 PM 04/24/2023   11:26 AM 01/11/2023    8:59 AM 10/24/2022   10:11 AM  Fall Risk   Falls in the past year? 0 0 0 0 0  Number falls in past yr: 0 0 0 0 0  Injury with Fall? 0 0 0 0 0  Risk for fall due to : No Fall Risks No Fall Risks No Fall Risks No Fall Risks No Fall Risks  Follow up Falls prevention discussed;Education provided;Falls evaluation completed Falls prevention discussed Falls prevention discussed Falls prevention discussed;Education provided;Falls evaluation completed Falls prevention discussed     Assessment & Plan  Assessment and Plan    Type 2 Diabetes Mellitus Poor control with recent weight gain and increased hunger. A1c increased to 7.2. Patient stopped Ozempic  due to insurance issues. Discussed the need for medication management. -Start Metformin  750mg  daily. -Check A1c in 3 months.  Dyslipidemia Slight increase in LDL cholesterol despite adherence to Atorvastatin . -Continue Atorvastatin . -Recheck lipid panel in 3 months.  Hypertension Well controlled on Metoprolol  25mg  and Olmesartan  20mg . -Continue current regimen.  ADHD Reports fatigue and irritability when not taking Vyvanse . -Continue Vyvanse . -Consider extending follow-up visits to every 4 months due to cost.  Obesity BMI 34.6. Recent weight gain over the holidays. Patient expressed goal to lose 10 pounds before next visit. -Encourage healthy diet and regular exercise.  Depression- Major and GAD In  remission for the past 3 years without medication. Currently managing with frankincense oil. -Continue current management strategy.  Migraine Infrequent episodes. Currently managed with Metoprolol , Maxalt , and Ubrelvy . -Continue current regimen. -Consider discontinuing Ubrelvy  if insurance is not obtained.  General Health Maintenance -Continue Hydroxyzine  for anxiety. -Check blood glucose levels regularly. -Encourage use of GoodRx for medication cost management.

## 2023-07-19 ENCOUNTER — Other Ambulatory Visit: Payer: Self-pay | Admitting: Family Medicine

## 2023-07-19 DIAGNOSIS — E782 Mixed hyperlipidemia: Secondary | ICD-10-CM

## 2023-09-03 LAB — HM DIABETES EYE EXAM

## 2023-10-16 ENCOUNTER — Encounter: Payer: Self-pay | Admitting: Family Medicine

## 2023-10-16 ENCOUNTER — Ambulatory Visit: Payer: Self-pay | Admitting: Family Medicine

## 2023-10-16 VITALS — BP 122/72 | HR 94 | Resp 16 | Ht 65.0 in | Wt 202.5 lb

## 2023-10-16 DIAGNOSIS — F411 Generalized anxiety disorder: Secondary | ICD-10-CM

## 2023-10-16 DIAGNOSIS — E785 Hyperlipidemia, unspecified: Secondary | ICD-10-CM

## 2023-10-16 DIAGNOSIS — I10 Essential (primary) hypertension: Secondary | ICD-10-CM

## 2023-10-16 DIAGNOSIS — E1169 Type 2 diabetes mellitus with other specified complication: Secondary | ICD-10-CM

## 2023-10-16 DIAGNOSIS — E782 Mixed hyperlipidemia: Secondary | ICD-10-CM

## 2023-10-16 DIAGNOSIS — F902 Attention-deficit hyperactivity disorder, combined type: Secondary | ICD-10-CM

## 2023-10-16 DIAGNOSIS — F325 Major depressive disorder, single episode, in full remission: Secondary | ICD-10-CM

## 2023-10-16 LAB — POCT GLYCOSYLATED HEMOGLOBIN (HGB A1C): Hemoglobin A1C: 6.7 % — AB (ref 4.0–5.6)

## 2023-10-16 MED ORDER — ATORVASTATIN CALCIUM 20 MG PO TABS: 20.0000 mg | ORAL_TABLET | Freq: Every day | ORAL | 1 refills | Status: DC

## 2023-10-16 MED ORDER — LISDEXAMFETAMINE DIMESYLATE 50 MG PO CAPS: 50.0000 mg | ORAL_CAPSULE | Freq: Every day | ORAL | 0 refills | Status: DC

## 2023-10-16 MED ORDER — OLMESARTAN MEDOXOMIL 20 MG PO TABS: 20.0000 mg | ORAL_TABLET | Freq: Every day | ORAL | 0 refills | Status: DC

## 2023-10-16 MED ORDER — METFORMIN HCL ER 750 MG PO TB24
750.0000 mg | ORAL_TABLET | Freq: Every day | ORAL | 0 refills | Status: DC
Start: 2023-10-16 — End: 2024-01-27

## 2023-10-16 MED ORDER — METOPROLOL SUCCINATE ER 25 MG PO TB24: 25.0000 mg | ORAL_TABLET | Freq: Every day | ORAL | 1 refills | Status: DC

## 2023-10-16 MED ORDER — HYDROXYZINE HCL 50 MG PO TABS: 50.0000 mg | ORAL_TABLET | Freq: Two times a day (BID) | ORAL | 0 refills | Status: DC

## 2023-10-16 NOTE — Progress Notes (Signed)
 Name: Melanie Villarreal   MRN: [id number]    DOB: [birth year]   Date:10/16/2023       Progress Note  Subjective  Chief Complaint  Chief Complaint  Patient presents with   Medical Management of Chronic Issues   HPI   DMII:  she had gestational diabetes with her middle child, it resolved after delivered in 2005 , she was diagnosed with type II DM in 2017 she has associated hypertension, dyslipidemia, obese, she also has neuropahty. She was  on Ozempic since May 2020, when A1C was 7.2 % ,A1C was well controlled with Ozempic however off medication due to cost. Currently taking Metformin 750 mg ER, she forgets to take it on the weekend. A1C is down to 6.7 % and she has lost some weight on medication. She is also taking statin and ARB   Dyslipidemia: Last LDL was up to 73. Continue statin therapy, she states she is compliant    HTN: currently taking half pill of Benicar 20 mg and also metoprolol, heart rate is at goal, denies recent episode of palpation . BP is at goal now    Obesity : she has been obese since age 31. She states worse since the last pregnancy. Her max weight was 262 lbs around 2016  Started diet plus Ozempic in May 1st  2020 when her weight was 235 lbs and weight was stable  181 -184 lbs however due to lack of insurance she has been off Ozempic since January 2024 , weight is now 202.5 lbs    ADHD: she states symptoms started in childhood, but was treated at age 48 , she has tried Adderal - caused anger outburst, felt tired at the end of the day, Strattera caused sedation, Ritalin caused palpitation. She has been on Vyvanse for the past 5 years and is working well for her. No side effects of medication, except is helps a little on her appetite She states her boss notices when she does not take the medication. She states her husband and children also noticed right away when she skips the dose. She has been getting 90 day supply of 50 mg dose.She is out of insurance but states unable to  function without medication and has been paying cash for the prescription. 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 Her parents were going through a divorce, mother left and stayed with her step-father, she has one brother that died as an infant and another one that died from drug overdose at age 78 ( she was  48 ). There is a family history of depression. She was on Zoloft from age 10 until year 2019 when she was switched from Zoloft to Viibrid because she had a relapse. ( she also states while on zoloft tried some other SSRI - not sure of the name).  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 She stopped seeing psychiatrist because of turn over of providers. She stopped taking Viibrid on May 13 th 22 because she forgot to take medication on vacation and felt fine without it, she is doing well on Hydroxizine prn, states Vyvanse also helps with her mood .Marland Kitchen She is doing daily devotional and bible studies, she is not exercising right now but will resume doing her regular physical activity.   Migraine headaches: she states seldom has episodes, she states she has a tingling sensation  on her scalp followed by a headache, it is described as a "zap" sensation . She has phonophobia and photophobia. She does not like taking Maxalt because of side effects, she has Ubrelvy at home. She only had one episode in 2023 , she states one episode in 2024 due to not sleeping while driving to her grand-father's funeral, no episodes in 2025 yet.    Goiter : large and palpable on right side. She states does not having insurance now and has not seen Endo. We will recheck TSH and T4 she states has difficulty swallowing and sometimes causes problems breathing. She cannot afford [place] at this time    Patient Active Problem List   Diagnosis Date Noted   Hypertension associated with diabetes (HCC) 01/15/2022    Major depression in remission (HCC) 01/15/2022   Palpitations 10/05/2021   Hypertension 10/05/2021   Obesity (BMI 30-39.9) 10/05/2021   Hypertension associated with type 2 diabetes mellitus (HCC) 12/09/2019   Hyperlipidemia associated with type 2 diabetes mellitus (HCC) 07/02/2019   Urge incontinence 08/15/2018   Headache disorder 06/17/2018   Family history of premature CAD 09/11/2017   Thyroid nodule 08/15/2017   LVH (left ventricular hypertrophy) 02/01/2017   Sinus tachycardia 02/24/2016   Valvular regurgitation 01/11/2016   ADHD (attention deficit hyperactivity disorder), combined type 08/30/2015   GAD (generalized anxiety disorder) 12/31/2014   Major depressive disorder, recurrent episode (HCC) 10/25/2014   Essential hypertension 10/25/2014    Past Surgical History:  Procedure Laterality Date   CESAREAN SECTION     ENDOMETRIAL ABLATION W/ NOVASURE  06/2008   estimate   thyroid nodule biopsy     TUBAL LIGATION      Family History  Problem Relation Age of Onset   Heart disease Mother    Hyperlipidemia Mother    Hypertension Mother    Miscarriages / [place] Mother        2   Anxiety disorder Mother    Coronary artery disease Mother 22       11 stents   Alcohol abuse Father    Cancer Father        testicular   Depression Father    Heart disease Sister    Hyperlipidemia Sister    Hypertension Sister    Other Sister         Langerhans Cell Histiocytosis   Drug abuse Brother    Alcohol abuse Brother    Depression Brother    ADD / ADHD Son    Mental illness Maternal Aunt    Alcohol abuse Maternal Grandfather    Depression Maternal Grandfather    Anxiety disorder Maternal Grandfather    Cancer Paternal Grandmother    Breast cancer Paternal Grandmother        late 70's   Alcohol abuse Paternal Grandfather     Social History   Tobacco Use   Smoking status: Former    Current packs/day: 0.00    Average packs/day: 0.5 packs/day for 25.3 years (12.7 ttl  pk-yrs)    Types: Cigarettes, E-cigarettes    Start date: 09/14/1995    Quit date: 01/10/2021    Years since quitting: 2.7   Smokeless tobacco: Never   Tobacco comments:    On an off over the years. She smoked for 12 years solid, otherwise on and off   Substance Use Topics   Alcohol use: Yes    Alcohol/week: 0.0 standard drinks of alcohol    Comment: rare     Current Outpatient Medications:  atorvastatin (LIPITOR) 20 MG tablet, TAKE ONE TABLET BY MOUTH ONE TIME DAILY, Disp: 90 tablet, Rfl: 0   Homeopathic Products (FRANKINCENSE UPLIFTING) OIL, Inhale 5 drops into the lungs daily., Disp: , Rfl:    hydrOXYzine (ATARAX) 50 MG tablet, Take 1 tablet (50 mg total) by mouth at bedtime., Disp: 90 tablet, Rfl: 1   lisdexamfetamine (VYVANSE) 50 MG capsule, Take 1 capsule (50 mg total) by mouth daily., Disp: 90 capsule, Rfl: 0   metFORMIN (GLUCOPHAGE-XR) 750 MG 24 hr tablet, Take 1 tablet (750 mg total) by mouth daily with breakfast., Disp: 90 tablet, Rfl: 0   metoprolol succinate (TOPROL-XL) 25 MG 24 hr tablet, Take 1 tablet (25 mg total) by mouth daily., Disp: 90 tablet, Rfl: 1   olmesartan (BENICAR) 20 MG tablet, Take 1 tablet (20 mg total) by mouth daily., Disp: 90 tablet, Rfl: 0   rizatriptan (MAXALT-MLT) 10 MG disintegrating tablet, Take 1 tablet (10 mg total) by mouth as needed for migraine. May repeat in 2 hours if needed (Patient not taking: Reported on 01/11/2023), Disp: 10 tablet, Rfl: 0   Ubrogepant (UBRELVY) 100 MG TABS, Take 1 tablet by mouth daily as needed. (Patient not taking: Reported on 10/16/2023), Disp: 9 tablet, Rfl: 1  Allergies  Allergen Reactions   Penicillins Anaphylaxis   Latex Hives and Rash    I personally reviewed active problem list, medication list, allergies with the patient/caregiver today.   ROS  Ten systems reviewed and is negative except as mentioned in HPI    Objective Physical Exam Constitutional: Patient appears well-developed and well-nourished.  Obese  No distress.  HEENT: head atraumatic, normocephalic, pupils equal and reactive to light, neck supple Cardiovascular: Normal rate, regular rhythm and normal heart sounds.  No murmur heard. No BLE edema. Pulmonary/Chest: Effort normal and breath sounds normal. No respiratory distress. Abdominal: Soft.  There is no tenderness. Psychiatric: Patient has a normal mood and affect. behavior is normal. Judgment and thought content normal.   Vitals:   10/16/23 0901  BP: 122/72  Pulse: 94  Resp: 16  SpO2: 99%  Weight: 202 lb 8 oz (91.9 kg)  Height: 5\' 5"  (1.651 m)    Body mass index is 33.7 kg/m.  Recent Results (from the past 2160 hours)  POCT glycosylated hemoglobin (Hb A1C)     Status: Abnormal   Collection Time: 07/18/23  9:14 AM  Result Value Ref Range   Hemoglobin A1C 7.2 (A) 4.0 - 5.6 %   HbA1c POC (<> result, manual entry)     HbA1c, POC (prediabetic range)     HbA1c, POC (controlled diabetic range)    POCT glycosylated hemoglobin (Hb A1C)     Status: Abnormal   Collection Time: 10/16/23  9:07 AM  Result Value Ref Range   Hemoglobin A1C 6.7 (A) 4.0 - 5.6 %   HbA1c POC (<> result, manual entry)     HbA1c, POC (prediabetic range)     HbA1c, POC (controlled diabetic range)      Diabetic Foot Exam:     PHQ2/9:    10/16/2023    9:02 AM 07/18/2023    9:09 AM 05/22/2023    1:08 PM 04/24/2023   11:26 AM 01/11/2023    8:59 AM  Depression screen PHQ 2/9  Decreased Interest 0 0 0 0 0  Down, Depressed, Hopeless 0 0 0 0 0  PHQ - 2 Score 0 0 0 0 0  Altered sleeping 0 0  0 0  Tired, decreased energy 0  0  0 0  Change in appetite 0 0  0 0  Feeling bad or failure about yourself  0 0  0 0  Trouble concentrating 0 0  0 0  Moving slowly or fidgety/restless 0 0  0 0  Suicidal thoughts 0 0  0 0  PHQ-9 Score 0 0  0 0  Difficult doing work/chores Not difficult at all Not difficult at all   Not difficult at all    phq 9 is negative  Fall Risk:    07/18/2023    8:59 AM 05/22/2023     1:08 PM 04/24/2023   11:26 AM 01/11/2023    8:59 AM 10/24/2022   10:11 AM  Fall Risk   Falls in the past year? 0 0 0 0 0  Number falls in past yr: 0 0 0 0 0  Injury with Fall? 0 0 0 0 0  Risk for fall due to : No Fall Risks No Fall Risks No Fall Risks No Fall Risks No Fall Risks  Follow up Falls prevention discussed;Education provided;Falls evaluation completed Falls prevention discussed Falls prevention discussed Falls prevention discussed;Education provided;Falls evaluation completed Falls prevention discussed     Assessment & Plan  1. Hyperlipidemia associated with type 2 diabetes mellitus (HCC) (Primary)  - POCT glycosylated hemoglobin (Hb A1C) - metFORMIN (GLUCOPHAGE-XR) 750 MG 24 hr tablet; Take 1 tablet (750 mg total) by mouth daily with breakfast.  Dispense: 90 tablet; Refill: 0  2. Mixed hyperlipidemia  - atorvastatin (LIPITOR) 20 MG tablet; Take 1 tablet (20 mg total) by mouth daily.  Dispense: 90 tablet; Refill: 1  3. Major depression in remission (HCC)  - hydrOXYzine (ATARAX) 50 MG tablet; Take 1 tablet (50 mg total) by mouth 2 (two) times daily.  Dispense: 100 tablet; Refill: 0  4. GAD (generalized anxiety disorder)  - hydrOXYzine (ATARAX) 50 MG tablet; Take 1 tablet (50 mg total) by mouth 2 (two) times daily.  Dispense: 100 tablet; Refill: 0  5. ADHD (attention deficit hyperactivity disorder), combined type  - lisdexamfetamine (VYVANSE) 50 MG capsule; Take 1 capsule (50 mg total) by mouth daily.  Dispense: 90 capsule; Refill: 0  6. Essential hypertension  - metoprolol succinate (TOPROL-XL) 25 MG 24 hr tablet; Take 1 tablet (25 mg total) by mouth daily.  Dispense: 90 tablet; Refill: 1 - olmesartan (BENICAR) 20 MG tablet; Take 1 tablet (20 mg total) by mouth daily.  Dispense: 90 tablet; Refill: 0

## 2024-01-23 ENCOUNTER — Other Ambulatory Visit: Payer: Self-pay | Admitting: Family Medicine

## 2024-01-23 DIAGNOSIS — F411 Generalized anxiety disorder: Secondary | ICD-10-CM

## 2024-01-23 DIAGNOSIS — F325 Major depressive disorder, single episode, in full remission: Secondary | ICD-10-CM

## 2024-01-24 ENCOUNTER — Ambulatory Visit: Payer: Self-pay | Admitting: Family Medicine

## 2024-01-24 ENCOUNTER — Other Ambulatory Visit: Payer: Self-pay | Admitting: Family Medicine

## 2024-01-24 DIAGNOSIS — F902 Attention-deficit hyperactivity disorder, combined type: Secondary | ICD-10-CM

## 2024-01-24 DIAGNOSIS — I10 Essential (primary) hypertension: Secondary | ICD-10-CM

## 2024-01-24 DIAGNOSIS — F411 Generalized anxiety disorder: Secondary | ICD-10-CM

## 2024-01-24 DIAGNOSIS — E782 Mixed hyperlipidemia: Secondary | ICD-10-CM

## 2024-01-24 DIAGNOSIS — E1169 Type 2 diabetes mellitus with other specified complication: Secondary | ICD-10-CM

## 2024-01-24 DIAGNOSIS — F325 Major depressive disorder, single episode, in full remission: Secondary | ICD-10-CM

## 2024-01-24 NOTE — Telephone Encounter (Signed)
  FYI Only or Action Required?: Action required by provider: medication refill request.  Patient was last seen in primary care on 10/16/2023 by Glenard Mire, MD.  Called Nurse Triage reporting No chief complaint on file..   Triage Disposition: Call PCP When Office is Open  Patient/caregiver understands and will follow disposition?: Yes       Patient is 3 days out of Hydroxyzine  Atorvastatin , and Metformin .                Copied from CRM [id number]. Topic: Clinical - Red Word Triage >> Jan 24, 2024  8:15 AM Jayma L wrote: Red Word that prompted transfer to Nurse Triage: pt is out of her medicine and having bad headaches that getting worse she been out of her medicine for the past 3 days and having a hard time. Reason for Disposition  [1] Prescription refill request for NON-ESSENTIAL medicine (i.e., no harm to patient if med not taken) AND [2] triager unable to refill per department policy  Answer Assessment - Initial Assessment Questions 1. DRUG NAME: What medicine do you need to have refilled?     Hydroxyzine , Atorvastatin , Metformin  2. REFILLS REMAINING: How many refills are remaining? Notes: The label on the medicine or pill bottle will show how many refills are remaining. If there are no refills remaining, then a renewal may be needed.     ---- 3. EXPIRATION DATE: What is the expiration date? Note: The label states when the prescription will expire, and thus can no longer be refilled.)     ---- 4. PRESCRIBER: Who prescribed it? Note: The prescribing doctor or group is responsible for refill approvals.SABRA     PCP Glenard Mire MD 5. PHARMACY: Have you contacted your pharmacy (drugstore)? Note: Some pharmacies will contact the doctor (or NP/PA).      Patient called her pharmacy but was advised afterwards that she couldn't get these filled yet 6. SYMPTOMS: Do you have any symptoms?     Patient denies    Patient states that she has been out of this  medication for 3 days, was told they couldn't be filled before her appt Monday but patient has been out of the medication and stated that she had to move this appt so that's why the refill is needed prior to the appt now. She states she believes her PCP is not in the office this week.  Protocols used: Medication Refill and Renewal Call-A-AH

## 2024-01-24 NOTE — Telephone Encounter (Unsigned)
 Copied from CRM [id number]. Topic: Clinical - Medication Refill >> Jan 24, 2024  8:08 AM Jayma L wrote: Medication:  hydrOXYzine  (ATARAX ) 50 MG tablet  metFORMIN  (GLUCOPHAGE -XR) 750 MG 24 hr tablet atorvastatin  (LIPITOR) 20 MG tablet  Has the patient contacted their pharmacy? Yes (Agent: If no, request that the patient contact the pharmacy for the refill. If patient does not wish to contact the pharmacy document the reason why and proceed with request.) (Agent: If yes, when and what did the pharmacy advise?)  This is the patient's preferred pharmacy:  [facility] [address] [facility] - [place], [place] - [address] [facility] [address] [facility] [address] [place] [address] Phone: [phone] Fax: [phone]  Is this the correct pharmacy for this prescription? Yes If no, delete pharmacy and type the correct one.   Has the prescription been filled recently? No  Is the patient out of the medication? Yes , been out for 3 days   Has the patient been seen for an appointment in the last year OR does the patient have an upcoming appointment? Yes  Can we respond through MyChart? No  Agent: Please be advised that Rx refills may take up to 3 business days. We ask that you follow-up with your pharmacy.

## 2024-01-25 ENCOUNTER — Other Ambulatory Visit: Payer: Self-pay | Admitting: Family Medicine

## 2024-01-25 DIAGNOSIS — E1169 Type 2 diabetes mellitus with other specified complication: Secondary | ICD-10-CM

## 2024-01-27 ENCOUNTER — Ambulatory Visit: Payer: Self-pay | Admitting: Family Medicine

## 2024-01-27 ENCOUNTER — Encounter: Payer: Self-pay | Admitting: Family Medicine

## 2024-01-27 VITALS — BP 136/82 | HR 87 | Resp 16 | Ht 65.0 in | Wt 204.5 lb

## 2024-01-27 DIAGNOSIS — F411 Generalized anxiety disorder: Secondary | ICD-10-CM

## 2024-01-27 DIAGNOSIS — E785 Hyperlipidemia, unspecified: Secondary | ICD-10-CM

## 2024-01-27 DIAGNOSIS — G43109 Migraine with aura, not intractable, without status migrainosus: Secondary | ICD-10-CM

## 2024-01-27 DIAGNOSIS — E1169 Type 2 diabetes mellitus with other specified complication: Secondary | ICD-10-CM

## 2024-01-27 DIAGNOSIS — E1159 Type 2 diabetes mellitus with other circulatory complications: Secondary | ICD-10-CM

## 2024-01-27 DIAGNOSIS — I10 Essential (primary) hypertension: Secondary | ICD-10-CM

## 2024-01-27 DIAGNOSIS — Z7984 Long term (current) use of oral hypoglycemic drugs: Secondary | ICD-10-CM

## 2024-01-27 DIAGNOSIS — I152 Hypertension secondary to endocrine disorders: Secondary | ICD-10-CM

## 2024-01-27 DIAGNOSIS — F325 Major depressive disorder, single episode, in full remission: Secondary | ICD-10-CM

## 2024-01-27 DIAGNOSIS — F902 Attention-deficit hyperactivity disorder, combined type: Secondary | ICD-10-CM

## 2024-01-27 LAB — POCT GLYCOSYLATED HEMOGLOBIN (HGB A1C): Hemoglobin A1C: 7.2 % — AB (ref 4.0–5.6)

## 2024-01-27 MED ORDER — LISDEXAMFETAMINE DIMESYLATE 50 MG PO CAPS: 50.0000 mg | ORAL_CAPSULE | Freq: Every day | ORAL | 0 refills | Status: DC

## 2024-01-27 MED ORDER — METFORMIN HCL ER 750 MG PO TB24: 750.0000 mg | ORAL_TABLET | Freq: Every day | ORAL | 1 refills | Status: DC

## 2024-01-27 MED ORDER — OLMESARTAN MEDOXOMIL 20 MG PO TABS: 10.0000 mg | ORAL_TABLET | Freq: Every day | ORAL | 0 refills | Status: DC

## 2024-01-27 NOTE — Progress Notes (Signed)
 Name: Melanie Villarreal   MRN: [id number]    DOB: 12-04-1975   Date:01/27/2024       Progress Note  Subjective  Chief Complaint  Chief Complaint  Patient presents with   Medical Management of Chronic Issues   HPI   DMII:  she had gestational diabetes with her middle child, it resolved after delivered in 2005 , she was diagnosed with type II DM in 2017 she has associated hypertension, dyslipidemia, obese, she also has neuropahty. She was  on Ozempic  since May 2020.  Currently taking Metformin  750 mg ER, she is out of medication for 6 days , she was also went to [place] . She does not want to go up on the dose. Today A1C is 7.2%. She has some trulicity at home that is 1.5 dose, explained that she may get sick if she goes straight to that dose She has been avoiding bread    Dyslipidemia: Last LDL was up to 73. Continue statin therapy, she states she is compliant    HTN: currently taking half pill of Benicar  20 mg and also metoprolol , heart rate is at goal, denies recent episode of palpation.  Continue current regiment    Obesity : she has been obese since age 51. She states worse since the last pregnancy. Her max weight was 262 lbs around 2016  Started diet plus Ozempic  in May 1st  2020 when her weight was 235 lbs and weight was stable  181 -184 lbs however due to lack of insurance she has been off Ozempic  since January 2024 , weight is stable even off GLP-1 agonist . Today is 204.5 lbs   ADHD: she states symptoms started in childhood, but was treated at age 9 , she has tried Adderal - caused anger outburst, felt tired at the end of the day, Strattera caused sedation, Ritalin caused palpitation. She has been on Vyvanse  for the past 5 years and is working well for her. No side effects of medication, except is helps a little on her appetite She states her boss notices when she does not take the medication. She states her husband and children also noticed right away when she skips the dose. She has been  getting 90 day supply of 50 mg dose. 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 Her parents were going through a divorce, mother left and stayed with her step-father, she has one brother that died as an infant and another one that died from drug overdose at age 55 ( she was  40 ). There is a family history of depression. She was on Zoloft  from age 39 until year 2019 when she was switched from Zoloft  to Viibrid because she had a relapse. ( she also states while on zoloft  tried some other SSRI - not sure of the name).  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 She stopped seeing psychiatrist because of turn over of providers. She stopped taking Viibrid on May 13 th 22 because she forgot to take medication on vacation and felt fine without it, she is doing well on Hydroxizine prn, states Vyvanse  also helps with her mood . She needs refills today    Migraine headaches: she states seldom has episodes, she states she has a tingling sensation on her scalp followed by a headache, it is described as a zap sensation . She  has phonophobia and photophobia. She does not like taking Maxalt  because of side effects, she has Ubrelvy  at home. She had a recent episodes last week.    Goiter : large and palpable on right side. She states does not having insurance now and has not seen Endo. We will recheck TSH and T4 she states has difficulty swallowing and sometimes causes problems breathing. She cannot afford US  at this time. Unchanged    Patient Active Problem List   Diagnosis Date Noted   Hypertension associated with diabetes (HCC) 01/15/2022   Major depression in remission (HCC) 01/15/2022   Palpitations 10/05/2021   Hypertension 10/05/2021   Obesity (BMI 30-39.9) 10/05/2021   Hypertension associated with type 2 diabetes mellitus (HCC) 12/09/2019   Hyperlipidemia associated with  type 2 diabetes mellitus (HCC) 07/02/2019   Urge incontinence 08/15/2018   Headache disorder 06/17/2018   Family history of premature CAD 09/11/2017   Thyroid  nodule 08/15/2017   LVH (left ventricular hypertrophy) 02/01/2017   Sinus tachycardia 02/24/2016   Valvular regurgitation 01/11/2016   ADHD (attention deficit hyperactivity disorder), combined type 08/30/2015   GAD (generalized anxiety disorder) 12/31/2014   Major depressive disorder, recurrent episode (HCC) 10/25/2014   Essential hypertension 10/25/2014    Past Surgical History:  Procedure Laterality Date   CESAREAN SECTION     ENDOMETRIAL ABLATION W/ NOVASURE  06/2008   estimate   thyroid  nodule biopsy     TUBAL LIGATION      Family History  Problem Relation Age of Onset   Heart disease Mother        CAD   Hyperlipidemia Mother    Hypertension Mother    Miscarriages / [place] Mother        2   Anxiety disorder Mother    Coronary artery disease Mother 35       11 stents   Alcohol abuse Father    Cancer Father        testicular   Depression Father    Heart disease Father        Testicular Cancer   Heart disease Sister    Hyperlipidemia Sister    Hypertension Sister    Other Sister         Langerhans Cell Histiocytosis   Drug abuse Brother    Alcohol abuse Brother    Depression Brother    ADD / ADHD Son    Mental illness Maternal Aunt    Alcohol abuse Maternal Grandfather    Depression Maternal Grandfather    Anxiety disorder Maternal Grandfather    Cancer Paternal Grandmother    Breast cancer Paternal Grandmother        late 70's   Alcohol abuse Paternal Grandfather     Social History   Tobacco Use   Smoking status: Former    Current packs/day: 0.00    Average packs/day: 0.5 packs/day for 25.3 years (12.7 ttl pk-yrs)    Types: Cigarettes, E-cigarettes    Start date: 09/14/1995    Quit date: 01/10/2021    Years since quitting: 3.0   Smokeless tobacco: Never   Tobacco comments:    On an off  over the years. She smoked for 12 years solid, otherwise on and off   Substance Use Topics   Alcohol use: Yes    Alcohol/week: 0.0 standard drinks of alcohol    Comment: rare     Current Outpatient Medications:    atorvastatin  (LIPITOR) 20 MG tablet, Take 1 tablet (20 mg total) by mouth daily.,  Disp: 90 tablet, Rfl: 1   Homeopathic Products (FRANKINCENSE UPLIFTING) OIL, Inhale 5 drops into the lungs daily., Disp: , Rfl:    hydrOXYzine  (ATARAX ) 50 MG tablet, TAKE ONE TABLET BY MOUTH TWICE A DAY, Disp: 60 tablet, Rfl: 0   lisdexamfetamine (VYVANSE ) 50 MG capsule, Take 1 capsule (50 mg total) by mouth daily., Disp: 90 capsule, Rfl: 0   metFORMIN  (GLUCOPHAGE -XR) 750 MG 24 hr tablet, Take 1 tablet (750 mg total) by mouth daily with breakfast., Disp: 90 tablet, Rfl: 0   metoprolol  succinate (TOPROL -XL) 25 MG 24 hr tablet, Take 1 tablet (25 mg total) by mouth daily., Disp: 90 tablet, Rfl: 1   olmesartan  (BENICAR ) 20 MG tablet, Take 1 tablet (20 mg total) by mouth daily., Disp: 90 tablet, Rfl: 0   rizatriptan  (MAXALT -MLT) 10 MG disintegrating tablet, Take 1 tablet (10 mg total) by mouth as needed for migraine. May repeat in 2 hours if needed, Disp: 10 tablet, Rfl: 0   Ubrogepant  (UBRELVY ) 100 MG TABS, Take 1 tablet by mouth daily as needed., Disp: 9 tablet, Rfl: 1  Allergies  Allergen Reactions   Penicillins Anaphylaxis   Latex Hives and Rash    I personally reviewed active problem list, medication list, allergies with the patient/caregiver today.   ROS  Ten systems reviewed and is negative except as mentioned in HPI    Objective Physical Exam Constitutional: Patient appears well-developed and well-nourished. Obese  No distress.  HEENT: head atraumatic, normocephalic, pupils equal and reactive to light, neck supple. Goiter  Cardiovascular: Normal rate, regular rhythm and normal heart sounds.  No murmur heard. No BLE edema. Pulmonary/Chest: Effort normal and breath sounds normal. No  respiratory distress. Abdominal: Soft.  There is no tenderness. Psychiatric: Patient has a normal mood and affect. behavior is normal. Judgment and thought content normal.   Vitals:   01/27/24 0939  BP: 136/82  Pulse: 87  Resp: 16  SpO2: 99%  Weight: 204 lb 8 oz (92.8 kg)  Height: 5' 5 (1.651 m)    Body mass index is 34.03 kg/m.  Recent Results (from the past 2160 hours)  POCT glycosylated hemoglobin (Hb A1C)     Status: Abnormal   Collection Time: 01/27/24  9:45 AM  Result Value Ref Range   Hemoglobin A1C 7.2 (A) 4.0 - 5.6 %   HbA1c POC (<> result, manual entry)     HbA1c, POC (prediabetic range)     HbA1c, POC (controlled diabetic range)        PHQ2/9:    01/27/2024    9:38 AM 10/16/2023    9:02 AM 07/18/2023    9:09 AM 05/22/2023    1:08 PM 04/24/2023   11:26 AM  Depression screen PHQ 2/9  Decreased Interest 0 0 0 0 0  Down, Depressed, Hopeless 0 0 0 0 0  PHQ - 2 Score 0 0 0 0 0  Altered sleeping 0 0 0  0  Tired, decreased energy 0 0 0  0  Change in appetite 0 0 0  0  Feeling bad or failure about yourself  0 0 0  0  Trouble concentrating 0 0 0  0  Moving slowly or fidgety/restless 0 0 0  0  Suicidal thoughts 0 0 0  0  PHQ-9 Score 0 0 0  0  Difficult doing work/chores Not difficult at all Not difficult at all Not difficult at all      phq 9 is negative  Fall Risk:    01/27/2024  9:29 AM 07/18/2023    8:59 AM 05/22/2023    1:08 PM 04/24/2023   11:26 AM 01/11/2023    8:59 AM  Fall Risk   Falls in the past year? 0 0 0 0 0  Number falls in past yr: 0 0 0 0 0  Injury with Fall? 0 0 0 0 0  Risk for fall due to : No Fall Risks No Fall Risks No Fall Risks No Fall Risks No Fall Risks  Follow up Falls evaluation completed Falls prevention discussed;Education provided;Falls evaluation completed Falls prevention discussed Falls prevention discussed Falls prevention discussed;Education provided;Falls evaluation completed     Assessment & Plan  1. Hyperlipidemia  associated with type 2 diabetes mellitus (HCC) (Primary)  - POCT glycosylated hemoglobin (Hb A1C) - metFORMIN  (GLUCOPHAGE -XR) 750 MG 24 hr tablet; Take 1 tablet (750 mg total) by mouth daily with breakfast.  Dispense: 90 tablet; Refill: 1  2. Major depression in remission (HCC)  Stable  3. Hypertension associated with diabetes (HCC)  - olmesartan  (BENICAR ) 20 MG tablet; Take 0.5 tablets (10 mg total) by mouth daily.  Dispense: 45 tablet; Refill: 0  4. ADHD (attention deficit hyperactivity disorder), combined type  - lisdexamfetamine (VYVANSE ) 50 MG capsule; Take 1 capsule (50 mg total) by mouth daily.  Dispense: 90 capsule; Refill: 0  5. GAD (generalized anxiety disorder)  Stable  6. Migraine with aura and without status migrainosus, not intractable  Stable  7. Essential hypertension  - olmesartan  (BENICAR ) 20 MG tablet; Take 0.5 tablets (10 mg total) by mouth daily.  Dispense: 45 tablet; Refill: 0

## 2024-01-27 NOTE — Telephone Encounter (Signed)
 Duplicate request, too soon for refill.  Requested Prescriptions  Pending Prescriptions Disp Refills   hydrOXYzine  (ATARAX ) 50 MG tablet 60 tablet 0    Sig: Take 1 tablet (50 mg total) by mouth 2 (two) times daily.     Ear, Nose, and Throat:  Antihistamines 2 Passed - 01/27/2024  9:23 AM      Passed - Cr in normal range and within 360 days    Creat  Date Value Ref Range Status  05/22/2023 0.65 0.50 - 0.99 mg/dL Final   Creatinine, Urine  Date Value Ref Range Status  05/22/2023 211 20 - 275 mg/dL Final         Passed - Valid encounter within last 12 months    Recent Outpatient Visits           3 months ago Hyperlipidemia associated with type 2 diabetes mellitus (HCC)   [facility], Krichna, MD               metFORMIN  (GLUCOPHAGE -XR) 750 MG 24 hr tablet 90 tablet 0    Sig: Take 1 tablet (750 mg total) by mouth daily with breakfast.     Endocrinology:  Diabetes - Biguanides Failed - 01/27/2024  9:23 AM      Failed - B12 Level in normal range and within 720 days    No results found for: VITAMINB12       Passed - Cr in normal range and within 360 days    Creat  Date Value Ref Range Status  05/22/2023 0.65 0.50 - 0.99 mg/dL Final   Creatinine, Urine  Date Value Ref Range Status  05/22/2023 211 20 - 275 mg/dL Final         Passed - HBA1C is between 0 and 7.9 and within 180 days    Hemoglobin A1C  Date Value Ref Range Status  10/16/2023 6.7 (A) 4.0 - 5.6 % Final   HbA1c, POC (controlled diabetic range)  Date Value Ref Range Status  08/15/2018 6.6 0.0 - 7.0 % Final   Hgb A1c MFr Bld  Date Value Ref Range Status  10/12/2021 5.7 (H) <5.7 % of total Hgb Final    Comment:    For someone without known diabetes, a hemoglobin  A1c value between 5.7% and 6.4% is consistent with prediabetes and should be confirmed with a  follow-up test. . For someone with known diabetes, a value <7% indicates that their diabetes is well controlled.  A1c targets should be individualized based on duration of diabetes, age, comorbid conditions, and other considerations. . This assay result is consistent with an increased risk of diabetes. . Currently, no consensus exists regarding use of hemoglobin A1c for diagnosis of diabetes for children. .          Passed - eGFR in normal range and within 360 days    GFR, Est African American  Date Value Ref Range Status  01/02/2021 120 > OR = 60 mL/min/1.80m2 Final   GFR, Est Non African American  Date Value Ref Range Status  01/02/2021 103 > OR = 60 mL/min/1.88m2 Final   eGFR  Date Value Ref Range Status  05/22/2023 111 > OR = 60 mL/min/1.73m2 Final         Passed - Valid encounter within last 6 months    Recent Outpatient Visits           3 months ago Hyperlipidemia associated with type 2 diabetes mellitus (HCC)   [facility] Glenard Mire, MD              Passed - CBC within normal limits and completed in the last 12 months    WBC  Date Value Ref Range Status  05/22/2023 9.3 3.8 - 10.8 Thousand/uL Final   RBC  Date Value Ref Range Status  05/22/2023 5.14 (H) 3.80 - 5.10 Million/uL Final   Hemoglobin  Date Value Ref Range Status  05/22/2023 15.1 11.7 - 15.5 g/dL Final   HGB  Date Value Ref Range Status  08/18/2014 13.9 12.0 - 16.0 g/dL Final   HCT  Date Value Ref Range Status  05/22/2023 44.8 35.0 - 45.0 % Final  08/18/2014 41.5 35.0 - 47.0 % Final   MCHC  Date Value Ref Range Status  05/22/2023 33.7 32.0 - 36.0 g/dL Final    Comment:    For adults, a slight decrease in the calculated MCHC value (in the range of 30 to 32 g/dL) is most likely not clinically significant; however, it should be interpreted with caution in correlation with other red cell parameters and the patient's clinical condition.    [facility]  Date Value Ref Range Status  05/22/2023 29.4 27.0 - 33.0 pg Final   MCV  Date Value Ref Range Status  05/22/2023  87.2 80.0 - 100.0 fL Final  08/18/2014 85 80 - 100 fL Final   No results found for: PLTCOUNTKUC, LABPLAT, POCPLA RDW  Date Value Ref Range Status  05/22/2023 12.4 11.0 - 15.0 % Final  08/18/2014 13.4 11.5 - 14.5 % Final          atorvastatin  (LIPITOR) 20 MG tablet 90 tablet 1    Sig: Take 1 tablet (20 mg total) by mouth daily.     Cardiovascular:  Antilipid - Statins Failed - 01/27/2024  9:23 AM      Failed - Lipid Panel in normal range within the last 12 months    Cholesterol  Date Value Ref Range Status  05/22/2023 132 <200 mg/dL Final   LDL Cholesterol (Calc)  Date Value Ref Range Status  05/22/2023 73 mg/dL (calc) Final    Comment:    Reference range: <100 . Desirable range <100 mg/dL for primary prevention;   <70 mg/dL for patients with CHD or diabetic patients  with > or = 2 CHD risk factors. SABRA LDL-C is now calculated using the Martin-Hopkins  calculation, which is a validated novel method providing  better accuracy than the Friedewald equation in the  estimation of LDL-C.  Gladis APPLETHWAITE et al. SANDREA. 7986;689(80): 2061-2068  (http://education.[facility].com/faq/FAQ164)    HDL  Date Value Ref Range Status  05/22/2023 36 (L) > OR = 50 mg/dL Final   Triglycerides  Date Value Ref Range Status  05/22/2023 150 (H) <150 mg/dL Final         Passed - Patient is not pregnant      Passed - Valid encounter within last 12 months    Recent Outpatient Visits           3 months ago Hyperlipidemia associated with type 2 diabetes mellitus [facility])   [facility], Krichna, MD

## 2024-02-16 ENCOUNTER — Other Ambulatory Visit: Payer: Self-pay | Admitting: Family Medicine

## 2024-02-16 DIAGNOSIS — F411 Generalized anxiety disorder: Secondary | ICD-10-CM

## 2024-02-16 DIAGNOSIS — F325 Major depressive disorder, single episode, in full remission: Secondary | ICD-10-CM

## 2024-04-10 ENCOUNTER — Other Ambulatory Visit: Payer: Self-pay | Admitting: Family Medicine

## 2024-04-10 DIAGNOSIS — F325 Major depressive disorder, single episode, in full remission: Secondary | ICD-10-CM

## 2024-04-10 DIAGNOSIS — F411 Generalized anxiety disorder: Secondary | ICD-10-CM

## 2024-04-14 ENCOUNTER — Ambulatory Visit: Payer: Self-pay | Admitting: Family Medicine

## 2024-04-14 ENCOUNTER — Encounter: Payer: Self-pay | Admitting: Family Medicine

## 2024-04-14 VITALS — BP 122/70 | HR 87 | Resp 16 | Ht 65.0 in | Wt 201.0 lb

## 2024-04-14 DIAGNOSIS — E1169 Type 2 diabetes mellitus with other specified complication: Secondary | ICD-10-CM

## 2024-04-14 DIAGNOSIS — E785 Hyperlipidemia, unspecified: Secondary | ICD-10-CM

## 2024-04-14 DIAGNOSIS — F411 Generalized anxiety disorder: Secondary | ICD-10-CM

## 2024-04-14 DIAGNOSIS — G43109 Migraine with aura, not intractable, without status migrainosus: Secondary | ICD-10-CM

## 2024-04-14 DIAGNOSIS — E119 Type 2 diabetes mellitus without complications: Secondary | ICD-10-CM

## 2024-04-14 DIAGNOSIS — F902 Attention-deficit hyperactivity disorder, combined type: Secondary | ICD-10-CM

## 2024-04-14 DIAGNOSIS — E669 Obesity, unspecified: Secondary | ICD-10-CM

## 2024-04-14 DIAGNOSIS — E1159 Type 2 diabetes mellitus with other circulatory complications: Secondary | ICD-10-CM

## 2024-04-14 DIAGNOSIS — I152 Hypertension secondary to endocrine disorders: Secondary | ICD-10-CM

## 2024-04-14 DIAGNOSIS — F325 Major depressive disorder, single episode, in full remission: Secondary | ICD-10-CM

## 2024-04-14 MED ORDER — OLMESARTAN MEDOXOMIL 20 MG PO TABS: 10.0000 mg | ORAL_TABLET | Freq: Every day | ORAL | 0 refills | Status: AC

## 2024-04-14 MED ORDER — LISDEXAMFETAMINE DIMESYLATE 50 MG PO CAPS: 50.0000 mg | ORAL_CAPSULE | Freq: Every day | ORAL | 0 refills | Status: DC

## 2024-04-14 MED ORDER — ATORVASTATIN CALCIUM 20 MG PO TABS: 20.0000 mg | ORAL_TABLET | Freq: Every day | ORAL | 0 refills | Status: DC

## 2024-04-14 MED ORDER — METFORMIN HCL ER 750 MG PO TB24: 750.0000 mg | ORAL_TABLET | Freq: Every day | ORAL | 0 refills | Status: DC

## 2024-04-14 MED ORDER — METOPROLOL SUCCINATE ER 25 MG PO TB24: 25.0000 mg | ORAL_TABLET | Freq: Every day | ORAL | 0 refills | Status: DC

## 2024-04-14 MED ORDER — HYDROXYZINE HCL 50 MG PO TABS: 50.0000 mg | ORAL_TABLET | Freq: Two times a day (BID) | ORAL | 0 refills | Status: DC | PRN

## 2024-04-14 NOTE — Progress Notes (Signed)
 Name: Melanie Villarreal   MRN: [id number]    DOB: [birth year]   Date:04/14/2024       Progress Note  Subjective  Chief Complaint  Chief Complaint  Patient presents with   Medication Refill   Discussed the use of AI scribe software for clinical note transcription with the patient, who gave verbal consent to proceed.  History of Present Illness Melanie Villarreal is a 48 year old female who presents for a three-month follow-up for anxiety and depression.  She has been experiencing heightened anxiety due to personal stressors, including family issues and work-related stress. She ran out of hydroxyzine , which she takes nightly for allergies and to help with sleep, and recently increased her dose to twice daily due to increased anxiety. She realized the medication also helps with her anxiety after running out.  She has a history of major depressive disorder diagnosed at age 13 during her parents' divorce. She uses frankincense oil and hydroxyzine  for her depression. She is not taking Zoloft  or Viibryd . Family stress, including a brother's drug overdose, has impacted her mental health. She currently only takes hydroxyzine  and states vyvanse  also improves her mood   She has ADHD, with symptoms starting in childhood. She began treatment with Adderall at age 84 but discontinued due to anger outbursts. Strattera caused sleepiness, and Ritalin caused palpitations. She has been on Vyvanse  since around 2020, which she finds effective in stabilizing her mood and helping her focus. Occasionally, she skips a dose to ensure its continued effectiveness.  She experiences migraine headaches with light and sound sensitivity but has not had an episode in the last few months. She avoids Maxalt  due to side effects and has Ubrelvy  available but has not needed to use it recently.  She has a goiter. A previous biopsy confirmed it is benign. She has not pursued further evaluation due to cost and lack of insurance.  She has  type 2 diabetes associated with obesity,  hypertension, and hyperlipidemia. She takes metformin , atorvastatin , metoprolol , and olmesartan . Her A1c was 7.2% during her last visit  up from 6.7%, which she attributes to drinking cranberry ginger ale, however she has been avoiding sodas since last visit and we will repeat A1C during her follow up  She reports urinary urgency over the past month and a half, which she attributes to stress rather than caffeine, as she does not consume caffeine. She denies dysuria, hematuria or fever. She states it is intermittent . Has to pinch her legs when in a rush to void    Patient Active Problem List   Diagnosis Date Noted   Hypertension associated with diabetes (HCC) 01/15/2022   Major depression in remission 01/15/2022   Palpitations 10/05/2021   Hypertension 10/05/2021   Obesity (BMI 30-39.9) 10/05/2021   Hypertension associated with type 2 diabetes mellitus (HCC) 12/09/2019   Hyperlipidemia associated with type 2 diabetes mellitus (HCC) 07/02/2019   Urge incontinence 08/15/2018   Headache disorder 06/17/2018   Family history of premature CAD 09/11/2017   Thyroid  nodule 08/15/2017   LVH (left ventricular hypertrophy) 02/01/2017   Sinus tachycardia 02/24/2016   Valvular regurgitation 01/11/2016   ADHD (attention deficit hyperactivity disorder), combined type 08/30/2015   GAD (generalized anxiety disorder) 12/31/2014   Major depressive disorder, recurrent episode (HCC) 10/25/2014   Essential hypertension 10/25/2014    Past Surgical History:  Procedure Laterality Date   CESAREAN SECTION     ENDOMETRIAL ABLATION W/ NOVASURE  06/2008   estimate   thyroid  nodule biopsy  TUBAL LIGATION      Family History  Problem Relation Age of Onset   Heart disease Mother        CAD   Hyperlipidemia Mother    Hypertension Mother    Miscarriages / [place] Mother        2   Anxiety disorder Mother    Coronary artery disease Mother 26       11 stents    Alcohol abuse Father    Cancer Father        testicular   Depression Father    Heart disease Father        Testicular Cancer   Heart disease Sister    Hyperlipidemia Sister    Hypertension Sister    Other Sister         Langerhans Cell Histiocytosis   Drug abuse Brother    Alcohol abuse Brother    Depression Brother    ADD / ADHD Son    Mental illness Maternal Aunt    Alcohol abuse Maternal Grandfather    Depression Maternal Grandfather    Anxiety disorder Maternal Grandfather    Cancer Paternal Grandmother    Breast cancer Paternal Grandmother        late 70's   Alcohol abuse Paternal Grandfather     Social History   Tobacco Use   Smoking status: Former    Current packs/day: 0.00    Average packs/day: 0.5 packs/day for 25.3 years (12.7 ttl pk-yrs)    Types: Cigarettes, E-cigarettes    Start date: 09/14/1995    Quit date: 01/10/2021    Years since quitting: 3.2   Smokeless tobacco: Never   Tobacco comments:    On an off over the years. She smoked for 12 years solid, otherwise on and off   Substance Use Topics   Alcohol use: Yes    Alcohol/week: 0.0 standard drinks of alcohol    Comment: rare     Current Outpatient Medications:    Homeopathic Products (FRANKINCENSE UPLIFTING) OIL, Inhale 5 drops into the lungs daily., Disp: , Rfl:    Ubrogepant  (UBRELVY ) 100 MG TABS, Take 1 tablet by mouth daily as needed., Disp: 9 tablet, Rfl: 1   atorvastatin  (LIPITOR) 20 MG tablet, Take 1 tablet (20 mg total) by mouth daily., Disp: 90 tablet, Rfl: 0   hydrOXYzine  (ATARAX ) 50 MG tablet, Take 1 tablet (50 mg total) by mouth 2 (two) times daily as needed., Disp: 125 tablet, Rfl: 0   lisdexamfetamine (VYVANSE ) 50 MG capsule, Take 1 capsule (50 mg total) by mouth daily., Disp: 90 capsule, Rfl: 0   metFORMIN  (GLUCOPHAGE -XR) 750 MG 24 hr tablet, Take 1 tablet (750 mg total) by mouth daily with breakfast., Disp: 90 tablet, Rfl: 0   metoprolol  succinate (TOPROL -XL) 25 MG 24 hr tablet,  Take 1 tablet (25 mg total) by mouth daily., Disp: 90 tablet, Rfl: 0   olmesartan  (BENICAR ) 20 MG tablet, Take 0.5 tablets (10 mg total) by mouth daily., Disp: 45 tablet, Rfl: 0  Allergies  Allergen Reactions   Penicillins Anaphylaxis   Latex Hives and Rash    I personally reviewed active problem list, medication list, allergies, family history with the patient/caregiver today.   ROS  Ten systems reviewed and is negative except as mentioned in HPI    Objective Physical Exam CONSTITUTIONAL: Patient appears well-developed and well-nourished. No distress. HEENT: Head atraumatic, normocephalic, neck supple. Thyroid  is prominent on the right side with visible swelling. CARDIOVASCULAR: Normal rate, regular rhythm and  normal heart sounds. No murmur heard. No BLE edema. PULMONARY: Effort normal and breath sounds normal. No respiratory distress. ABDOMINAL: There is no tenderness or distention. MUSCULOSKELETAL: Normal gait. Without gross motor or sensory deficit. PSYCHIATRIC: Patient has a normal mood and affect. Behavior is normal. Judgment and thought content normal.  Vitals:   04/14/24 1512  BP: 122/70  Pulse: 87  Resp: 16  SpO2: 99%  Weight: 201 lb (91.2 kg)  Height: 5' 5 (1.651 m)    Body mass index is 33.45 kg/m.  Recent Results (from the past 2160 hours)  POCT glycosylated hemoglobin (Hb A1C)     Status: Abnormal   Collection Time: 01/27/24  9:45 AM  Result Value Ref Range   Hemoglobin A1C 7.2 (A) 4.0 - 5.6 %   HbA1c POC (<> result, manual entry)     HbA1c, POC (prediabetic range)     HbA1c, POC (controlled diabetic range)      Diabetic Foot Exam:  Diabetic foot exam was performed with the following findings:   No deformities, ulcerations, or other skin breakdown Normal sensation of 10g monofilament Intact posterior tibialis and dorsalis pedis pulses      PHQ2/9:    04/14/2024    3:11 PM 01/27/2024    9:38 AM 10/16/2023    9:02 AM 07/18/2023    9:09 AM  05/22/2023    1:08 PM  Depression screen PHQ 2/9  Decreased Interest 0 0 0 0 0  Down, Depressed, Hopeless 0 0 0 0 0  PHQ - 2 Score 0 0 0 0 0  Altered sleeping  0 0 0   Tired, decreased energy  0 0 0   Change in appetite  0 0 0   Feeling bad or failure about yourself   0 0 0   Trouble concentrating  0 0 0   Moving slowly or fidgety/restless  0 0 0   Suicidal thoughts  0 0 0   PHQ-9 Score  0 0 0   Difficult doing work/chores  Not difficult at all Not difficult at all Not difficult at all     phq 9 is negative  Fall Risk:    04/14/2024    3:11 PM 01/27/2024    9:29 AM 07/18/2023    8:59 AM 05/22/2023    1:08 PM 04/24/2023   11:26 AM  Fall Risk   Falls in the past year? 0 0 0 0 0  Number falls in past yr: 0 0 0 0 0  Injury with Fall? 0 0 0 0 0  Risk for fall due to : No Fall Risks No Fall Risks No Fall Risks No Fall Risks No Fall Risks  Follow up Falls evaluation completed Falls evaluation completed Falls prevention discussed;Education provided;Falls evaluation completed Falls prevention discussed Falls prevention discussed      Assessment & Plan Generalized anxiety disorder and major depressive disorder Anxiety exacerbated by stressors, managed with hydroxyzine . Depression managed with frankincense, no pharmacological treatment. - Prescribe hydroxyzine  50 mg, up to twice daily, 125 pills to match the rx fill of other medications, go down to 100 after that  - Continue frankincense for depression. - Follow-up in three months.  Attention-deficit hyperactivity disorder (ADHD) ADHD managed with Vyvanse , previous medications not tolerated. - Prescribe Vyvanse , refill on October 27th.  Type 2 diabetes mellitus with associated obesity, HTN and dyslipidemia A1c increased to 7.2%, possible dietary influence. - Continue metformin . - Advise discontinuation of cranberry ginger ale. - Schedule eye exam in October.  Essential hypertension Hypertension managed with metoprolol  and  olmesartan , no side effects. - Prescribe metoprolol  with Vyvanse  refill. - Continue olmesartan .  Hyperlipidemia Managed with atorvastatin , no side effects. - Continue atorvastatin .  Obesity  Weight loss on Ozempic , considering GLP-1 agonists. - Consider GLP-1 agonist therapy when insured. - Monitor weight and dietary habits.  Migraine without aura Infrequent migraines, Ubrelvy  prescribed, Maxalt  not preferred. - Continue Ubrelvy  as needed. - Discontinue Maxalt .  Benign right-sided thyroid  nodule (goiter) Benign nodule, no insurance for further evaluation. - Plan evaluation and possible ultrasound when insured in January.  Urinary urgency Intermittent urgency, possibly stress-related, no infection. - Consider stress management techniques. - Encourage Kegel exercises. - we will check urine culture if no resolution. Patient does not have insurance   General Health Maintenance Pending preventive care due to lack of insurance. - Plan mammogram in spring through hospital program. - Delay colonoscopy until insured.

## 2024-04-23 ENCOUNTER — Other Ambulatory Visit: Payer: Self-pay | Admitting: Family Medicine

## 2024-04-23 DIAGNOSIS — E119 Type 2 diabetes mellitus without complications: Secondary | ICD-10-CM

## 2024-04-23 DIAGNOSIS — E1169 Type 2 diabetes mellitus with other specified complication: Secondary | ICD-10-CM

## 2024-05-15 ENCOUNTER — Telehealth: Payer: Self-pay | Admitting: Family Medicine

## 2024-05-15 NOTE — Telephone Encounter (Signed)
 Copied from CRM #[id number]. Topic: Clinical - Prescription Issue >> May 15, 2024  9:07 AM Berneda FALCON wrote: Reason for CRM: Patient states that [facility] is where her RX for lisdexamfetamine (VYVANSE ) 50 MG capsule was sent and they told her they do not have enough in stock. She states she cannot work without it and needs it immediately sent to the [facility] pharmacy today please.  She states that they can fill 60 of the 90, and wants to see if this would be okay? For the [facility] on [address]. If so, we would need to get the other 30 for her, does she need to get a follow up for the other 30? Just wants to be sure she can do this with no issues.  Requested to speak to nurse in office. Called [facility] and spoke to Cassandra who states that they are short staffed and clinical is in with patients at this time. Sending high priority CRM  Please send correspondence through MyChart so she has something in writing.  Please call patient back at [phone]

## 2024-06-26 LAB — OPHTHALMOLOGY REPORT-SCANNED

## 2024-07-10 ENCOUNTER — Other Ambulatory Visit: Payer: Self-pay | Admitting: Family Medicine

## 2024-07-10 DIAGNOSIS — E119 Type 2 diabetes mellitus without complications: Secondary | ICD-10-CM

## 2024-07-10 DIAGNOSIS — E1169 Type 2 diabetes mellitus with other specified complication: Secondary | ICD-10-CM

## 2024-07-13 NOTE — Telephone Encounter (Signed)
 Requested Prescriptions  Pending Prescriptions Disp Refills   atorvastatin  (LIPITOR) 20 MG tablet [Pharmacy Med Name: ATORVASTATIN  20 MG TAB] 90 tablet 0    Sig: TAKE ONE TABLET BY MOUTH ONE TIME DAILY     Cardiovascular:  Antilipid - Statins Failed - 07/13/2024  4:08 PM      Failed - Lipid Panel in normal range within the last 12 months    Cholesterol  Date Value Ref Range Status  05/22/2023 132 <200 mg/dL Final   LDL Cholesterol (Calc)  Date Value Ref Range Status  05/22/2023 73 mg/dL (calc) Final    Comment:    Reference range: <100 . Desirable range <100 mg/dL for primary prevention;   <70 mg/dL for patients with CHD or diabetic patients  with > or = 2 CHD risk factors. SABRA LDL-C is now calculated using the Martin-Hopkins  calculation, which is a validated novel method providing  better accuracy than the Friedewald equation in the  estimation of LDL-C.  Gladis APPLETHWAITE et al. SANDREA. 7986;689(80): 2061-2068  (http://education.[facility].com/faq/FAQ164)    HDL  Date Value Ref Range Status  05/22/2023 36 (L) > OR = 50 mg/dL Final   Triglycerides  Date Value Ref Range Status  05/22/2023 150 (H) <150 mg/dL Final         Passed - Patient is not pregnant      Passed - Valid encounter within last 12 months    Recent Outpatient Visits           3 months ago Obesity, diabetes, and hypertension syndrome [facility])   [place] [facility] [place], Dorette, MD   5 months ago Hyperlipidemia associated with type 2 diabetes mellitus [facility])   [place] [facility] Sowles, Krichna, MD   9 months ago Hyperlipidemia associated with type 2 diabetes mellitus [facility])   [facility] [facility] Sowles, Krichna, MD

## 2024-07-17 ENCOUNTER — Telehealth: Payer: Self-pay

## 2024-07-17 ENCOUNTER — Other Ambulatory Visit: Payer: Self-pay | Admitting: Family Medicine

## 2024-07-17 DIAGNOSIS — F325 Major depressive disorder, single episode, in full remission: Secondary | ICD-10-CM

## 2024-07-17 DIAGNOSIS — F902 Attention-deficit hyperactivity disorder, combined type: Secondary | ICD-10-CM

## 2024-07-17 MED ORDER — LISDEXAMFETAMINE DIMESYLATE 50 MG PO CAPS: 50.0000 mg | ORAL_CAPSULE | Freq: Every day | ORAL | 0 refills | Status: DC

## 2024-07-17 NOTE — Telephone Encounter (Signed)
 Copied from CRM #[id number]. Topic: Clinical - Prescription Issue >> Jul 15, 2024  1:07 PM Winona R wrote: Pt states the pharmacy has faxed over a request for  lisdexamfetamine  (VYVANSE ) 50 MG capsule [[id number]] - prev received 60 day supply as the pharmacy was out of stock for 90 day supply [facility] [id number] [place], [place] - [address] Pt will be completely out after 01/01 >> Jul 17, 2024 10:41 AM Larissa S wrote: Patient calling back to check the status of prescription for lisdexamfetamine  (VYVANSE ) 50 MG capsule. States she is out of medication and can not work without it. Requesting to have prescription sent to: [facility] [id number] [place], [place] - [address]  [place] [address]  Phone: [phone] Fax: [phone]  Hours: Not open 24 hours

## 2024-07-22 ENCOUNTER — Other Ambulatory Visit: Payer: Self-pay | Admitting: Family Medicine

## 2024-07-22 DIAGNOSIS — E119 Type 2 diabetes mellitus without complications: Secondary | ICD-10-CM

## 2024-07-22 DIAGNOSIS — E1169 Type 2 diabetes mellitus with other specified complication: Secondary | ICD-10-CM

## 2024-07-23 NOTE — Telephone Encounter (Signed)
 Requested medications are due for refill today.  yes  Requested medications are on the active medications list.  yes  Last refill. 04/14/2024 90 day supply for both.  Future visit scheduled.   yes  Notes to clinic.  Labs are expired    Requested Prescriptions  Pending Prescriptions Disp Refills   olmesartan  (BENICAR ) 20 MG tablet [Pharmacy Med Name: OLMESARTAN  20 MG TAB[*]] 45 tablet 0    Sig: TAKE ONE-HALF TABLET BY MOUTH ONE TIME DAILY     Cardiovascular:  Angiotensin Receptor Blockers Failed - 07/23/2024  2:39 PM      Failed - Cr in normal range and within 180 days    Creat  Date Value Ref Range Status  05/22/2023 0.65 0.50 - 0.99 mg/dL Final   Creatinine, Urine  Date Value Ref Range Status  05/22/2023 211 20 - 275 mg/dL Final         Failed - K in normal range and within 180 days    Potassium  Date Value Ref Range Status  05/22/2023 4.2 3.5 - 5.3 mmol/L Final  08/18/2014 3.7 3.5 - 5.1 mmol/L Final         Passed - Patient is not pregnant      Passed - Last BP in normal range    BP Readings from Last 1 Encounters:  04/14/24 122/70         Passed - Valid encounter within last 6 months    Recent Outpatient Visits           3 months ago Obesity, diabetes, and hypertension syndrome (HCC)   [place] [facility] [place] Mire, MD   5 months ago Hyperlipidemia associated with type 2 diabetes mellitus [facility])   [place] [facility] Glen[place] Mire, MD   9 months ago Hyperlipidemia associated with type 2 diabetes mellitus [facility])   [place] [facility] Glen[place] Mire, MD               metFORMIN  (GLUCOPHAGE -XR) 750 MG 24 hr tablet [Pharmacy Med Name: METFORMIN  EXT REL 750 MG TAB(GLUCOPHAGE )] 90 tablet 0    Sig: TAKE ONE TABLET BY MOUTH EVERY MORNING WITH BREAKFAST     Endocrinology:  Diabetes - Biguanides Failed - 07/23/2024  2:39 PM      Failed - Cr in normal range and within 360 days    Creat  Date Value Ref  Range Status  05/22/2023 0.65 0.50 - 0.99 mg/dL Final   Creatinine, Urine  Date Value Ref Range Status  05/22/2023 211 20 - 275 mg/dL Final         Failed - eGFR in normal range and within 360 days    GFR, Est African American  Date Value Ref Range Status  01/02/2021 120 > OR = 60 mL/min/1.61m2 Final   GFR, Est Non African American  Date Value Ref Range Status  01/02/2021 103 > OR = 60 mL/min/1.35m2 Final   eGFR  Date Value Ref Range Status  05/22/2023 111 > OR = 60 mL/min/1.70m2 Final         Failed - B12 Level in normal range and within 720 days    No results found for: VITAMINB12       Failed - CBC within normal limits and completed in the last 12 months    WBC  Date Value Ref Range Status  05/22/2023 9.3 3.8 - 10.8 Thousand/uL Final   RBC  Date Value Ref Range Status  05/22/2023 5.14 (H) 3.80 -  5.10 Million/uL Final   Hemoglobin  Date Value Ref Range Status  05/22/2023 15.1 11.7 - 15.5 g/dL Final   HGB  Date Value Ref Range Status  08/18/2014 13.9 12.0 - 16.0 g/dL Final   HCT  Date Value Ref Range Status  05/22/2023 44.8 35.0 - 45.0 % Final  08/18/2014 41.5 35.0 - 47.0 % Final   MCHC  Date Value Ref Range Status  05/22/2023 33.7 32.0 - 36.0 g/dL Final    Comment:    For adults, a slight decrease in the calculated MCHC value (in the range of 30 to 32 g/dL) is most likely not clinically significant; however, it should be interpreted with caution in correlation with other red cell parameters and the patient's clinical condition.    [facility]  Date Value Ref Range Status  05/22/2023 29.4 27.0 - 33.0 pg Final   MCV  Date Value Ref Range Status  05/22/2023 87.2 80.0 - 100.0 fL Final  08/18/2014 85 80 - 100 fL Final   No results found for: PLTCOUNTKUC, LABPLAT, POCPLA RDW  Date Value Ref Range Status  05/22/2023 12.4 11.0 - 15.0 % Final  08/18/2014 13.4 11.5 - 14.5 % Final         Passed - HBA1C is between 0 and 7.9 and within 180 days     Hemoglobin A1C  Date Value Ref Range Status  01/27/2024 7.2 (A) 4.0 - 5.6 % Final   HbA1c, POC (controlled diabetic range)  Date Value Ref Range Status  08/15/2018 6.6 0.0 - 7.0 % Final   Hgb A1c MFr Bld  Date Value Ref Range Status  10/12/2021 5.7 (H) <5.7 % of total Hgb Final    Comment:    For someone without known diabetes, a hemoglobin  A1c value between 5.7% and 6.4% is consistent with prediabetes and should be confirmed with a  follow-up test. . For someone with known diabetes, a value <7% indicates that their diabetes is well controlled. A1c targets should be individualized based on duration of diabetes, age, comorbid conditions, and other considerations. . This assay result is consistent with an increased risk of diabetes. . Currently, no consensus exists regarding use of hemoglobin A1c for diagnosis of diabetes for children. SABRA Amy - Valid encounter within last 6 months    Recent Outpatient Visits           3 months ago Obesity, diabetes, and hypertension syndrome [facility])   [place] [facility] [place], Dorette, MD   5 months ago Hyperlipidemia associated with type 2 diabetes mellitus [facility])   [place] Sowles, Krichna, MD   9 months ago Hyperlipidemia associated with type 2 diabetes mellitus [facility])   [facility] Sowles, Krichna, MD

## 2024-07-28 ENCOUNTER — Encounter: Payer: Self-pay | Admitting: Family Medicine

## 2024-07-28 ENCOUNTER — Ambulatory Visit (INDEPENDENT_AMBULATORY_CARE_PROVIDER_SITE_OTHER): Payer: Self-pay | Admitting: Family Medicine

## 2024-07-28 VITALS — BP 128/80 | HR 86 | Resp 16 | Ht 65.0 in | Wt 200.7 lb

## 2024-07-28 DIAGNOSIS — Z1211 Encounter for screening for malignant neoplasm of colon: Secondary | ICD-10-CM

## 2024-07-28 DIAGNOSIS — F411 Generalized anxiety disorder: Secondary | ICD-10-CM

## 2024-07-28 DIAGNOSIS — F325 Major depressive disorder, single episode, in full remission: Secondary | ICD-10-CM

## 2024-07-28 DIAGNOSIS — E785 Hyperlipidemia, unspecified: Secondary | ICD-10-CM

## 2024-07-28 DIAGNOSIS — F902 Attention-deficit hyperactivity disorder, combined type: Secondary | ICD-10-CM

## 2024-07-28 DIAGNOSIS — Z1231 Encounter for screening mammogram for malignant neoplasm of breast: Secondary | ICD-10-CM

## 2024-07-28 DIAGNOSIS — I10 Essential (primary) hypertension: Secondary | ICD-10-CM

## 2024-07-28 DIAGNOSIS — E66811 Obesity, class 1: Secondary | ICD-10-CM

## 2024-07-28 DIAGNOSIS — E119 Type 2 diabetes mellitus without complications: Secondary | ICD-10-CM

## 2024-07-28 DIAGNOSIS — E1169 Type 2 diabetes mellitus with other specified complication: Secondary | ICD-10-CM

## 2024-07-28 DIAGNOSIS — Z7984 Long term (current) use of oral hypoglycemic drugs: Secondary | ICD-10-CM

## 2024-07-28 LAB — POCT GLYCOSYLATED HEMOGLOBIN (HGB A1C): Hemoglobin A1C: 6.7 % — AB (ref 4.0–5.6)

## 2024-07-28 MED ORDER — LISDEXAMFETAMINE DIMESYLATE 50 MG PO CAPS: 50.0000 mg | ORAL_CAPSULE | Freq: Every day | ORAL | 0 refills | Status: AC

## 2024-07-28 MED ORDER — ATORVASTATIN CALCIUM 20 MG PO TABS: 20.0000 mg | ORAL_TABLET | Freq: Every day | ORAL | 0 refills | Status: AC

## 2024-07-28 MED ORDER — METFORMIN HCL ER 750 MG PO TB24: 750.0000 mg | ORAL_TABLET | Freq: Every day | ORAL | 0 refills | Status: AC

## 2024-07-28 MED ORDER — HYDROXYZINE HCL 50 MG PO TABS: 50.0000 mg | ORAL_TABLET | Freq: Two times a day (BID) | ORAL | 0 refills | Status: AC | PRN

## 2024-07-28 MED ORDER — METOPROLOL SUCCINATE ER 25 MG PO TB24: 25.0000 mg | ORAL_TABLET | Freq: Every day | ORAL | 0 refills | Status: AC

## 2024-07-28 NOTE — Progress Notes (Signed)
 Name: Melanie Villarreal   MRN: [id number]    DOB: 10-08-1975   Date:07/28/2024       Progress Note  Subjective  Chief Complaint  Chief Complaint  Patient presents with   Medical Management of Chronic Issues   Discussed the use of AI scribe software for clinical note transcription with the patient, who gave verbal consent to proceed.  History of Present Illness Melanie Villarreal is a 49 year old female with type 2 diabetes, obesity, hypertension, and hyperlipidemia who presents for a follow-up visit.  Her current A1c is 6.7, down from 7.2, and she reports increased soda consumption during the holidays due to family gatherings and her son being home from the [employer]. She takes metformin  750 mg once daily at night but notes the medication sometimes appears undissolved in her stool.  She denies polyphagia, polydipsia or polyuria .   For hypertension associated with DM , she is on metoprolol  25 mg and olmesartan .  Regarding hyperlipidemia, she is prescribed atorvastatin  and is due for a lipid panel. She denies myopathy   She experiences anxiety and ADHD, for which she takes Vyvanse  50mg  daily . She skips Vyvanse  on weekends to avoid dependency. She states medication works well for her and keeps her focused  She reports a sensation in her left lower leg, described as a feeling under the skin, which is bothersome when shaving but not painful. She wonders if it could be related to a nerve issue.  In terms of mental health, she describes herself as an empath and feels emotional after interacting with the homeless population. She does not identify as having depression but took a mental health day recently due to work stress.    Patient Active Problem List   Diagnosis Date Noted   Hypertension associated with diabetes (HCC) 01/15/2022   Major depression in remission 01/15/2022   Palpitations 10/05/2021   Hypertension 10/05/2021   Obesity (BMI 30-39.9) 10/05/2021   Hypertension associated with  type 2 diabetes mellitus (HCC) 12/09/2019   Hyperlipidemia associated with type 2 diabetes mellitus (HCC) 07/02/2019   Urge incontinence 08/15/2018   Headache disorder 06/17/2018   Family history of premature CAD 09/11/2017   Thyroid  nodule 08/15/2017   LVH (left ventricular hypertrophy) 02/01/2017   Sinus tachycardia 02/24/2016   Valvular regurgitation 01/11/2016   ADHD (attention deficit hyperactivity disorder), combined type 08/30/2015   GAD (generalized anxiety disorder) 12/31/2014   Major depressive disorder, recurrent episode (HCC) 10/25/2014   Essential hypertension 10/25/2014    Past Surgical History:  Procedure Laterality Date   CESAREAN SECTION     ENDOMETRIAL ABLATION W/ NOVASURE  06/2008   estimate   thyroid  nodule biopsy     TUBAL LIGATION      Family History  Problem Relation Age of Onset   Heart disease Mother        CAD   Hyperlipidemia Mother    Hypertension Mother    Miscarriages / Stillbirths Mother        2   Anxiety disorder Mother    Coronary artery disease Mother 55       11 stents   Alcohol abuse Father    Cancer Father        testicular   Depression Father    Heart disease Father        Testicular Cancer   Heart disease Sister    Hyperlipidemia Sister    Hypertension Sister    Other Sister  Langerhans Cell Histiocytosis   Drug abuse Brother    Alcohol abuse Brother    Depression Brother    ADD / ADHD Son    Mental illness Maternal Aunt    Alcohol abuse Maternal Grandfather    Depression Maternal Grandfather    Anxiety disorder Maternal Grandfather    Cancer Paternal Grandmother    Breast cancer Paternal Grandmother        late 70's   Alcohol abuse Paternal Grandfather     Social History   Tobacco Use   Smoking status: Former    Current packs/day: 0.00    Average packs/day: 0.5 packs/day for 25.3 years (12.7 ttl pk-yrs)    Types: Cigarettes, E-cigarettes    Start date: 09/14/1995    Quit date: 01/10/2021    Years since  quitting: 3.5   Smokeless tobacco: Never   Tobacco comments:    On an off over the years. She smoked for 12 years solid, otherwise on and off   Substance Use Topics   Alcohol use: Yes    Alcohol/week: 0.0 standard drinks of alcohol    Comment: rare    Current Medications[1]  Allergies[2]  I personally reviewed active problem list, medication list, allergies, family history with the patient/caregiver today.   ROS  Ten systems reviewed and is negative except as mentioned in HPI     Objective Physical Exam  CONSTITUTIONAL: Patient appears well-developed and well-nourished.  No distress. HEENT: Head atraumatic, normocephalic, neck supple. CARDIOVASCULAR: Normal rate, regular rhythm and normal heart sounds.  No murmur heard. No BLE edema. PULMONARY: Effort normal and breath sounds normal. No respiratory distress. ABDOMINAL: There is no tenderness or distention. MUSCULOSKELETAL: Normal gait. Paresthesia left lower leg lateral aspect PSYCHIATRIC: Patient has a normal mood and affect. behavior is normal. Judgment and thought content normal.  Vitals:   07/28/24 0844  BP: 128/80  Pulse: 86  Resp: 16  SpO2: 97%  Weight: 200 lb 11.2 oz (91 kg)  Height: 5' 5 (1.651 m)    Body mass index is 33.4 kg/m.  Recent Results (from the past 2160 hours)  OPHTHALMOLOGY REPORT-SCANNED     Status: None   Collection Time: 06/26/24  2:16 PM  Result Value Ref Range   HM Diabetic Eye Exam No Retinopathy No Retinopathy    Comment: Abstracted by HIM   A Comment       PHQ2/9:    07/28/2024    8:36 AM 04/14/2024    3:11 PM 01/27/2024    9:38 AM 10/16/2023    9:02 AM 07/18/2023    9:09 AM  Depression screen PHQ 2/9  Decreased Interest 0 0 0 0 0  Down, Depressed, Hopeless 0 0 0 0 0  PHQ - 2 Score 0 0 0 0 0  Altered sleeping 0  0 0 0  Tired, decreased energy 0  0 0 0  Change in appetite 0  0 0 0  Feeling bad or failure about yourself  0  0 0 0  Trouble concentrating 0  0 0 0  Moving  slowly or fidgety/restless 0  0 0 0  Suicidal thoughts 0  0 0 0  PHQ-9 Score 0  0  0  0   Difficult doing work/chores Not difficult at all  Not difficult at all Not difficult at all Not difficult at all     Data saved with a previous flowsheet row definition    phq 9 is negative  Fall Risk:    07/28/2024  8:36 AM 04/14/2024    3:11 PM 01/27/2024    9:29 AM 07/18/2023    8:59 AM 05/22/2023    1:08 PM  Fall Risk   Falls in the past year? 0 0 0 0 0  Number falls in past yr: 0 0 0 0 0  Injury with Fall? 0 0  0  0  0   Risk for fall due to : No Fall Risks No Fall Risks No Fall Risks No Fall Risks No Fall Risks  Follow up Falls evaluation completed Falls evaluation completed Falls evaluation completed Falls prevention discussed;Education provided;Falls evaluation completed Falls prevention discussed     Data saved with a previous flowsheet row definition    Assessment & Plan Type 2 diabetes mellitus with obesity, hypertension, and hyperlipidemia Type 2 diabetes with improved A1c. Metformin  well-tolerated. Dehydration risk noted. No alcohol use. - Continue metformin  750 mg once daily. - Order liver function tests, lipid panel, kidney function tests, and urine protein tests. - Encourage hydration. - Opted for fecal immunochemistry test for colon cancer screening.  Attention-deficit hyperactivity disorder, combined type Vyvanse  50 mg effective but not fully. Advised against dose increase due to side effects and blood pressure concerns. - Continue Vyvanse  50 mg. - Prescribe three separate 30-day prescriptions for cost management.  Generalized anxiety disorder Managed with hydroxyzine . No significant side effects. - Continue hydroxyzine  as prescribed.  Major depressive disorder in full remission In full remission. Emotional responses not indicative of depression. - Continue current management and monitor mood changes.  General Health Maintenance Due for routine screenings. Discussed  mammogram and colon cancer screening options. - Order fecal immunochemistry test for colon cancer screening. - Provide information on mammogram program for uninsured.        [1]  Current Outpatient Medications:    atorvastatin  (LIPITOR) 20 MG tablet, TAKE ONE TABLET BY MOUTH ONE TIME DAILY, Disp: 90 tablet, Rfl: 0   Homeopathic Products (FRANKINCENSE UPLIFTING) OIL, Inhale 5 drops into the lungs daily., Disp: , Rfl:    hydrOXYzine  (ATARAX ) 50 MG tablet, Take 1 tablet (50 mg total) by mouth 2 (two) times daily as needed., Disp: 125 tablet, Rfl: 0   lisdexamfetamine  (VYVANSE ) 50 MG capsule, Take 1 capsule (50 mg total) by mouth daily., Disp: 30 capsule, Rfl: 0   metFORMIN  (GLUCOPHAGE -XR) 750 MG 24 hr tablet, Take 1 tablet (750 mg total) by mouth daily with breakfast., Disp: 90 tablet, Rfl: 0   metoprolol  succinate (TOPROL -XL) 25 MG 24 hr tablet, Take 1 tablet (25 mg total) by mouth daily., Disp: 90 tablet, Rfl: 0   olmesartan  (BENICAR ) 20 MG tablet, Take 0.5 tablets (10 mg total) by mouth daily., Disp: 45 tablet, Rfl: 0   Ubrogepant  (UBRELVY ) 100 MG TABS, Take 1 tablet by mouth daily as needed., Disp: 9 tablet, Rfl: 1 [2]  Allergies Allergen Reactions   Penicillins Anaphylaxis   Latex Hives and Rash

## 2024-07-29 LAB — CBC WITH DIFFERENTIAL/PLATELET
Absolute Lymphocytes: 3208 {cells}/uL (ref 850–3900)
Absolute Monocytes: 626 {cells}/uL (ref 200–950)
Basophils Absolute: 32 {cells}/uL (ref 0–200)
Basophils Relative: 0.3 %
Eosinophils Absolute: 76 {cells}/uL (ref 15–500)
Eosinophils Relative: 0.7 %
HCT: 44.7 % (ref 35.9–46.0)
Hemoglobin: 15 g/dL (ref 11.7–15.5)
MCH: 29.1 pg (ref 27.0–33.0)
MCHC: 33.6 g/dL (ref 31.6–35.4)
MCV: 86.8 fL (ref 81.4–101.7)
MPV: 10.8 fL (ref 7.5–12.5)
Monocytes Relative: 5.8 %
Neutro Abs: 6858 {cells}/uL (ref 1500–7800)
Neutrophils Relative %: 63.5 %
Platelets: 380 Thousand/uL (ref 140–400)
RBC: 5.15 Million/uL — ABNORMAL HIGH (ref 3.80–5.10)
RDW: 12.9 % (ref 11.0–15.0)
Total Lymphocyte: 29.7 %
WBC: 10.8 Thousand/uL (ref 3.8–10.8)

## 2024-07-29 LAB — COMPREHENSIVE METABOLIC PANEL WITH GFR
AG Ratio: 1.7 (calc) (ref 1.0–2.5)
ALT: 19 U/L (ref 6–29)
AST: 16 U/L (ref 10–35)
Albumin: 4.7 g/dL (ref 3.6–5.1)
Alkaline phosphatase (APISO): 77 U/L (ref 31–125)
BUN: 12 mg/dL (ref 7–25)
CO2: 24 mmol/L (ref 20–32)
Calcium: 9.6 mg/dL (ref 8.6–10.2)
Chloride: 106 mmol/L (ref 98–110)
Creat: 0.62 mg/dL (ref 0.50–0.99)
Globulin: 2.8 g/dL (ref 1.9–3.7)
Glucose, Bld: 116 mg/dL — ABNORMAL HIGH (ref 65–99)
Potassium: 4.1 mmol/L (ref 3.5–5.3)
Sodium: 140 mmol/L (ref 135–146)
Total Bilirubin: 0.7 mg/dL (ref 0.2–1.2)
Total Protein: 7.5 g/dL (ref 6.1–8.1)
eGFR: 111 mL/min/1.73m2

## 2024-07-29 LAB — LIPID PANEL
Cholesterol: 226 mg/dL — ABNORMAL HIGH
HDL: 33 mg/dL — ABNORMAL LOW
LDL Cholesterol (Calc): 149 mg/dL — ABNORMAL HIGH
Non-HDL Cholesterol (Calc): 193 mg/dL — ABNORMAL HIGH
Total CHOL/HDL Ratio: 6.8 (calc) — ABNORMAL HIGH
Triglycerides: 267 mg/dL — ABNORMAL HIGH

## 2024-07-29 LAB — MICROALBUMIN / CREATININE URINE RATIO
Creatinine, Urine: 180 mg/dL (ref 20–275)
Microalb Creat Ratio: 3 mg/g{creat}
Microalb, Ur: 0.5 mg/dL

## 2024-08-02 ENCOUNTER — Ambulatory Visit: Payer: Self-pay | Admitting: Family Medicine

## 2024-08-03 ENCOUNTER — Telehealth: Payer: Self-pay

## 2024-08-03 NOTE — Telephone Encounter (Signed)
 Copied from CRM [id number]. Topic: Clinical - Lab/Test Results >> Aug 03, 2024 12:03 PM Pinkey ORN wrote: Reason for CRM: Lab >> Aug 03, 2024 12:04 PM Pinkey ORN wrote: Returning a missed office call from [facility], CMA

## 2024-08-03 NOTE — Telephone Encounter (Signed)
 Returned call- no answer.

## 2024-10-27 ENCOUNTER — Ambulatory Visit: Payer: Self-pay | Admitting: Family Medicine
